# Patient Record
Sex: Female | Born: 1950
Health system: Southern US, Community
[De-identification: ages and names within clinical notes are randomized; demographics above are authoritative.]

## PROBLEM LIST (undated history)

## (undated) DIAGNOSIS — Z923 Personal history of irradiation: Secondary | ICD-10-CM

## (undated) DIAGNOSIS — IMO0002 Reserved for concepts with insufficient information to code with codable children: Secondary | ICD-10-CM

## (undated) DIAGNOSIS — Z803 Family history of malignant neoplasm of breast: Secondary | ICD-10-CM

## (undated) DIAGNOSIS — R112 Nausea with vomiting, unspecified: Secondary | ICD-10-CM

## (undated) DIAGNOSIS — T8859XA Other complications of anesthesia, initial encounter: Secondary | ICD-10-CM

## (undated) DIAGNOSIS — Z9889 Other specified postprocedural states: Secondary | ICD-10-CM

## (undated) DIAGNOSIS — M810 Age-related osteoporosis without current pathological fracture: Secondary | ICD-10-CM

## (undated) DIAGNOSIS — E785 Hyperlipidemia, unspecified: Secondary | ICD-10-CM

## (undated) DIAGNOSIS — Z9221 Personal history of antineoplastic chemotherapy: Secondary | ICD-10-CM

## (undated) DIAGNOSIS — M8580 Other specified disorders of bone density and structure, unspecified site: Secondary | ICD-10-CM

## (undated) DIAGNOSIS — J309 Allergic rhinitis, unspecified: Secondary | ICD-10-CM

## (undated) DIAGNOSIS — Z853 Personal history of malignant neoplasm of breast: Secondary | ICD-10-CM

## (undated) DIAGNOSIS — Z801 Family history of malignant neoplasm of trachea, bronchus and lung: Secondary | ICD-10-CM

## (undated) HISTORY — DX: Other specified disorders of bone density and structure, unspecified site: M85.80

## (undated) HISTORY — DX: Family history of malignant neoplasm of breast: Z80.3

## (undated) HISTORY — DX: Personal history of malignant neoplasm of breast: Z85.3

## (undated) HISTORY — DX: Reserved for concepts with insufficient information to code with codable children: IMO0002

## (undated) HISTORY — PX: LATISSIMUS FLAP TO BREAST: SHX5357

## (undated) HISTORY — DX: Age-related osteoporosis without current pathological fracture: M81.0

## (undated) HISTORY — PX: OTHER SURGICAL HISTORY: SHX169

## (undated) HISTORY — DX: Allergic rhinitis, unspecified: J30.9

## (undated) HISTORY — DX: Hyperlipidemia, unspecified: E78.5

## (undated) HISTORY — DX: Family history of malignant neoplasm of trachea, bronchus and lung: Z80.1

## (undated) HISTORY — PX: BREAST LUMPECTOMY: SHX2

---

## 1990-07-27 DIAGNOSIS — C50919 Malignant neoplasm of unspecified site of unspecified female breast: Secondary | ICD-10-CM

## 1990-07-27 HISTORY — DX: Malignant neoplasm of unspecified site of unspecified female breast: C50.919

## 1999-07-15 ENCOUNTER — Encounter: Payer: Self-pay | Admitting: Family Medicine

## 1999-07-15 ENCOUNTER — Encounter: Admission: RE | Admit: 1999-07-15 | Discharge: 1999-07-15 | Payer: Self-pay | Admitting: Family Medicine

## 2000-02-18 ENCOUNTER — Other Ambulatory Visit: Admission: RE | Admit: 2000-02-18 | Discharge: 2000-02-18 | Payer: Self-pay | Admitting: Family Medicine

## 2000-07-26 ENCOUNTER — Encounter: Payer: Self-pay | Admitting: Gynecology

## 2000-07-26 ENCOUNTER — Encounter: Admission: RE | Admit: 2000-07-26 | Discharge: 2000-07-26 | Payer: Self-pay | Admitting: Gynecology

## 2001-08-01 ENCOUNTER — Encounter: Admission: RE | Admit: 2001-08-01 | Discharge: 2001-08-01 | Payer: Self-pay | Admitting: Family Medicine

## 2001-08-01 ENCOUNTER — Encounter: Payer: Self-pay | Admitting: Family Medicine

## 2001-11-03 ENCOUNTER — Other Ambulatory Visit: Admission: RE | Admit: 2001-11-03 | Discharge: 2001-11-03 | Payer: Self-pay | Admitting: Family Medicine

## 2002-07-14 ENCOUNTER — Emergency Department (HOSPITAL_COMMUNITY): Admission: EM | Admit: 2002-07-14 | Discharge: 2002-07-14 | Payer: Self-pay | Admitting: Emergency Medicine

## 2002-08-07 ENCOUNTER — Encounter: Admission: RE | Admit: 2002-08-07 | Discharge: 2002-08-07 | Payer: Self-pay | Admitting: Family Medicine

## 2002-08-07 ENCOUNTER — Encounter: Payer: Self-pay | Admitting: Family Medicine

## 2003-08-21 ENCOUNTER — Encounter: Admission: RE | Admit: 2003-08-21 | Discharge: 2003-08-21 | Payer: Self-pay | Admitting: Family Medicine

## 2004-07-11 ENCOUNTER — Emergency Department: Payer: Self-pay | Admitting: Emergency Medicine

## 2004-09-16 ENCOUNTER — Encounter: Admission: RE | Admit: 2004-09-16 | Discharge: 2004-09-16 | Payer: Self-pay | Admitting: Family Medicine

## 2005-05-31 ENCOUNTER — Emergency Department: Payer: Self-pay | Admitting: General Practice

## 2005-06-16 ENCOUNTER — Ambulatory Visit: Payer: Self-pay | Admitting: Family Medicine

## 2005-09-21 ENCOUNTER — Encounter: Admission: RE | Admit: 2005-09-21 | Discharge: 2005-09-21 | Payer: Self-pay | Admitting: Family Medicine

## 2005-09-24 ENCOUNTER — Encounter: Admission: RE | Admit: 2005-09-24 | Discharge: 2005-09-24 | Payer: Self-pay | Admitting: Family Medicine

## 2006-03-25 ENCOUNTER — Other Ambulatory Visit: Admission: RE | Admit: 2006-03-25 | Discharge: 2006-03-25 | Payer: Self-pay | Admitting: Family Medicine

## 2006-03-25 ENCOUNTER — Encounter: Payer: Self-pay | Admitting: Family Medicine

## 2006-03-25 ENCOUNTER — Ambulatory Visit: Payer: Self-pay | Admitting: Family Medicine

## 2006-09-29 ENCOUNTER — Encounter: Admission: RE | Admit: 2006-09-29 | Discharge: 2006-09-29 | Payer: Self-pay | Admitting: Family Medicine

## 2007-04-15 ENCOUNTER — Ambulatory Visit: Payer: Self-pay | Admitting: Family Medicine

## 2007-04-15 DIAGNOSIS — H1045 Other chronic allergic conjunctivitis: Secondary | ICD-10-CM

## 2007-04-15 DIAGNOSIS — Z853 Personal history of malignant neoplasm of breast: Secondary | ICD-10-CM

## 2007-04-15 DIAGNOSIS — J309 Allergic rhinitis, unspecified: Secondary | ICD-10-CM | POA: Insufficient documentation

## 2007-08-18 ENCOUNTER — Ambulatory Visit: Payer: Self-pay | Admitting: Family Medicine

## 2007-10-27 ENCOUNTER — Encounter: Admission: RE | Admit: 2007-10-27 | Discharge: 2007-10-27 | Payer: Self-pay | Admitting: Family Medicine

## 2007-11-01 ENCOUNTER — Encounter (INDEPENDENT_AMBULATORY_CARE_PROVIDER_SITE_OTHER): Payer: Self-pay | Admitting: *Deleted

## 2007-12-26 ENCOUNTER — Telehealth: Payer: Self-pay | Admitting: Family Medicine

## 2008-02-23 ENCOUNTER — Encounter: Admission: RE | Admit: 2008-02-23 | Discharge: 2008-02-23 | Payer: Self-pay | Admitting: Family Medicine

## 2008-04-06 ENCOUNTER — Ambulatory Visit: Payer: Self-pay | Admitting: Family Medicine

## 2008-04-06 ENCOUNTER — Other Ambulatory Visit: Admission: RE | Admit: 2008-04-06 | Discharge: 2008-04-06 | Payer: Self-pay | Admitting: Family Medicine

## 2008-04-06 ENCOUNTER — Encounter: Payer: Self-pay | Admitting: Family Medicine

## 2008-04-06 DIAGNOSIS — M810 Age-related osteoporosis without current pathological fracture: Secondary | ICD-10-CM | POA: Insufficient documentation

## 2008-04-10 LAB — CONVERTED CEMR LAB
ALT: 16 units/L (ref 0–35)
Albumin: 4.2 g/dL (ref 3.5–5.2)
BUN: 20 mg/dL (ref 6–23)
Calcium: 9.7 mg/dL (ref 8.4–10.5)
Cholesterol: 244 mg/dL — ABNORMAL HIGH (ref 0–200)
HDL: 50 mg/dL (ref >39)
Sodium: 142 meq/L (ref 135–145)
TSH: 1.547 microintl units/mL (ref 0.350–4.50)
Total CHOL/HDL Ratio: 4.9
Total Protein: 6.5 g/dL (ref 6.0–8.3)
Triglycerides: 180 mg/dL — ABNORMAL HIGH (ref <150)

## 2008-04-11 ENCOUNTER — Encounter: Payer: Self-pay | Admitting: Family Medicine

## 2008-04-11 ENCOUNTER — Encounter: Admission: RE | Admit: 2008-04-11 | Discharge: 2008-04-11 | Payer: Self-pay | Admitting: Family Medicine

## 2008-04-13 ENCOUNTER — Ambulatory Visit: Payer: Self-pay | Admitting: Family Medicine

## 2008-04-13 DIAGNOSIS — E785 Hyperlipidemia, unspecified: Secondary | ICD-10-CM | POA: Insufficient documentation

## 2008-04-26 ENCOUNTER — Ambulatory Visit: Payer: Self-pay | Admitting: Gastroenterology

## 2008-11-23 ENCOUNTER — Encounter: Admission: RE | Admit: 2008-11-23 | Discharge: 2008-11-23 | Payer: Self-pay | Admitting: Family Medicine

## 2008-11-29 ENCOUNTER — Encounter (INDEPENDENT_AMBULATORY_CARE_PROVIDER_SITE_OTHER): Payer: Self-pay | Admitting: *Deleted

## 2008-12-04 ENCOUNTER — Ambulatory Visit: Payer: Self-pay | Admitting: Family Medicine

## 2009-04-24 ENCOUNTER — Telehealth: Payer: Self-pay | Admitting: Family Medicine

## 2009-05-30 ENCOUNTER — Ambulatory Visit: Payer: Self-pay | Admitting: Family Medicine

## 2009-09-03 ENCOUNTER — Ambulatory Visit: Payer: Self-pay | Admitting: Family Medicine

## 2009-09-10 ENCOUNTER — Ambulatory Visit: Payer: Self-pay | Admitting: Family Medicine

## 2009-09-26 ENCOUNTER — Emergency Department: Payer: Self-pay | Admitting: Internal Medicine

## 2009-10-07 ENCOUNTER — Ambulatory Visit: Payer: Self-pay | Admitting: Family Medicine

## 2009-11-28 ENCOUNTER — Other Ambulatory Visit: Admission: RE | Admit: 2009-11-28 | Discharge: 2009-11-28 | Payer: Self-pay | Admitting: Family Medicine

## 2009-11-28 ENCOUNTER — Encounter (INDEPENDENT_AMBULATORY_CARE_PROVIDER_SITE_OTHER): Payer: Self-pay | Admitting: *Deleted

## 2009-11-28 ENCOUNTER — Ambulatory Visit: Payer: Self-pay | Admitting: Family Medicine

## 2009-11-28 LAB — CONVERTED CEMR LAB: Pap Smear: NORMAL

## 2009-11-29 ENCOUNTER — Encounter: Admission: RE | Admit: 2009-11-29 | Discharge: 2009-11-29 | Payer: Self-pay | Admitting: Family Medicine

## 2009-11-29 LAB — HM MAMMOGRAPHY

## 2009-12-02 ENCOUNTER — Encounter (INDEPENDENT_AMBULATORY_CARE_PROVIDER_SITE_OTHER): Payer: Self-pay | Admitting: *Deleted

## 2009-12-02 LAB — CONVERTED CEMR LAB
Alkaline Phosphatase: 88 units/L (ref 39–117)
Basophils Absolute: 0 10*3/uL (ref 0.0–0.1)
Basophils Relative: 0.5 % (ref 0.0–3.0)
Bilirubin, Direct: 0.1 mg/dL (ref 0.0–0.3)
Calcium: 9.7 mg/dL (ref 8.4–10.5)
Creatinine, Ser: 0.7 mg/dL (ref 0.4–1.2)
Direct LDL: 165.9 mg/dL
Eosinophils Absolute: 0.1 10*3/uL (ref 0.0–0.7)
Eosinophils Relative: 1.7 % (ref 0.0–5.0)
Glucose, Bld: 92 mg/dL (ref 70–99)
Lymphocytes Relative: 27.3 % (ref 12.0–46.0)
Lymphs Abs: 1.7 10*3/uL (ref 0.7–4.0)
MCV: 88.8 fL (ref 78.0–100.0)
Neutro Abs: 3.9 10*3/uL (ref 1.4–7.7)
Neutrophils Relative %: 62.2 % (ref 43.0–77.0)
Potassium: 3.8 meq/L (ref 3.5–5.1)
RDW: 13.7 % (ref 11.5–14.6)
Sodium: 142 meq/L (ref 135–145)
TSH: 2.3 microintl units/mL (ref 0.35–5.50)
Total CHOL/HDL Ratio: 4

## 2009-12-09 ENCOUNTER — Encounter: Payer: Self-pay | Admitting: Family Medicine

## 2010-01-10 ENCOUNTER — Encounter (INDEPENDENT_AMBULATORY_CARE_PROVIDER_SITE_OTHER): Payer: Self-pay

## 2010-03-06 ENCOUNTER — Ambulatory Visit: Payer: Self-pay | Admitting: Family Medicine

## 2010-03-07 LAB — CONVERTED CEMR LAB
ALT: 13 units/L (ref 0–35)
AST: 15 units/L (ref 0–37)
Direct LDL: 158.5 mg/dL
HDL: 46.7 mg/dL (ref >39.00)
VLDL: 24.2 mg/dL (ref 0.0–40.0)

## 2010-03-14 ENCOUNTER — Ambulatory Visit: Payer: Self-pay | Admitting: Family Medicine

## 2010-04-14 ENCOUNTER — Encounter: Payer: Self-pay | Admitting: Family Medicine

## 2010-07-07 ENCOUNTER — Ambulatory Visit: Payer: Self-pay | Admitting: Family Medicine

## 2010-07-24 ENCOUNTER — Ambulatory Visit: Payer: Self-pay | Admitting: Family Medicine

## 2010-08-07 ENCOUNTER — Telehealth: Payer: Self-pay | Admitting: Family Medicine

## 2010-08-15 ENCOUNTER — Ambulatory Visit
Admission: RE | Admit: 2010-08-15 | Discharge: 2010-08-15 | Payer: Self-pay | Source: Home / Self Care | Attending: Family Medicine | Admitting: Family Medicine

## 2010-08-15 DIAGNOSIS — IMO0002 Reserved for concepts with insufficient information to code with codable children: Secondary | ICD-10-CM | POA: Insufficient documentation

## 2010-08-17 ENCOUNTER — Encounter: Payer: Self-pay | Admitting: Family Medicine

## 2010-08-26 NOTE — Assessment & Plan Note (Signed)
Summary: ROA  CYD   Vital Signs:  Patient profile:   60 year old female Height:      66 inches Weight:      164.25 pounds BMI:     26.61 Temp:     98.1 degrees F oral Pulse rate:   72 / minute Pulse rhythm:   regular BP sitting:   132 / 80  (left arm) Cuff size:   regular  Vitals Entered By: Lewanda Rife LPN (March 14, 2010 9:57 AM) CC: follow-up visit   History of Present Illness: here for f/u of lipids wt is down 1 lb  has 2 new grandchildren- excited about it   bp good  132/80  lipids are down just a bit with  trig 121 and HDL 46 and LDL 158 (from 165) she did work on her diet  in general eating more salads  smaller portions  cutting out the red meat  eats chicken  in general nothing fried -- but the chicken on her salad is fried   uses cream in her coffee   is apprehensive to take cholesterol med - but will try it   is back to work - MTW is sore in back where lattisimus was removed for flap  tried a hot pad apart from that is doing very well     Allergies: 1)  ! Fosamax 2)  Evista (Raloxifene Hcl)  Past History:  Past Medical History: Last updated: 11/28/2009 Allergic rhinitis Breast cancer, hx of  (needs yearly mam and mri every 2  osteopenia  hyperlipidemia  OP  Past Surgical History: Last updated: 02/23/2008 Breast cancer excision with latissumus flap secondary to radiation burn 7/09 neg screening breast MRI  Family History: Last updated: 04/13/2008  mother -- b12 deficiency, breast ca, melanoma , myelodysplasic syndrome , OP sister b 12 deficiency, breast cancer , OP  multiple cousins with breast cancer   Social History: Last updated: 04/06/2008 Marital Status: Married Children: 4 Occupation:  quit smoking years ago  never drinks alcohol   Risk Factors: Smoking Status: quit (04/15/2007)  Review of Systems General:  Denies fatigue, loss of appetite, and malaise. CV:  Denies chest pain or discomfort, palpitations, shortness of  breath with exertion, and swelling of feet. Resp:  Denies cough and wheezing. GI:  Denies change in bowel habits. MS:  Denies muscle aches and cramps. Derm:  Denies lesion(s) and rash. Neuro:  Denies numbness and tingling. Psych:  Denies anxiety and depression. Endo:  Denies cold intolerance, excessive thirst, excessive urination, and heat intolerance. Heme:  Denies abnormal bruising and bleeding.  Physical Exam  General:  Well-developed,well-nourished,in no acute distress; alert,appropriate and cooperative throughout examination Head:  normocephalic, atraumatic, and no abnormalities observed.   Eyes:  vision grossly intact, pupils equal, pupils round, and pupils reactive to light.  no conjunctival pallor, injection or icterus  Neck:  supple with full rom and no masses or thyromegally, no JVD or carotid bruit  Lungs:  Normal respiratory effort, chest expands symmetrically. Lungs are clear to auscultation, no crackles or wheezes. Heart:  Normal rate and regular rhythm. S1 and S2 normal without gallop, murmur, click, rub or other extra sounds. Msk:  No deformity or scoliosis noted of thoracic or lumbar spine.   Pulses:  R and L carotid,radial,femoral,dorsalis pedis and posterior tibial pulses are full and equal bilaterally Extremities:  No clubbing, cyanosis, edema, or deformity noted with normal full range of motion of all joints.   Skin:  Intact without suspicious  lesions or rashes   Impression & Recommendations:  Problem # 1:  HYPERLIPIDEMIA (ICD-272.4) Assessment Unchanged  disc low sat fat diet in detail recommended she stop fried chicken/ cream/ high fat dressings start zocor 20  adv if side eff stop and call lab in 6 weeks  disc goals for HDL and LDL chol and risks of high chol today in detail Her updated medication list for this problem includes:    Zocor 20 Mg Tabs (Simvastatin) .Marland Kitchen... 1 by mouth once daily in evening  Labs Reviewed: SGOT: 15 (03/06/2010)   SGPT: 13  (03/06/2010)   HDL:46.70 (03/06/2010), 57.20 (11/28/2009)  LDL:158 (04/06/2008)  Chol:218 (03/06/2010), 232 (11/28/2009)  Trig:121.0 (03/06/2010), 134.0 (11/28/2009)  Orders: Prescription Created Electronically [phone])  Complete Medication List: 1)  Multivitamins Tabs (Multiple vitamin) .... Takes 3 tablets by mouth daily 2)  Vitamin C 500 Mg Tabs (Ascorbic acid) .... Take 1 tablet by mouth three times a day 3)  Aleve 220 Mg Tabs (Naproxen sodium) .... Otc as directed. 4)  Miacalcin 200 Unit/ml Soln (Calcitonin (salmon)) .Marland Kitchen.. 1 spray in one nostril daily- alternate nostrils 5)  Vitamin D 2000 Unit Tabs (Cholecalciferol) .... Take 1 tablet by mouth once a day 6)  Zocor 20 Mg Tabs (Simvastatin) .Marland Kitchen.. 1 by mouth once daily in evening  Patient Instructions: 1)  switch to non fat creamer 2)  stop the crispy / fried chicken  3)  you can raise your HDL (good cholesterol) by increasing exercise and eating omega 3 fatty acid supplement like fish oil or flax seed oil over the counter 4)  you can lower LDL (bad cholesterol) by limiting saturated fats in diet like red meat, fried foods, egg yolks, fatty breakfast meats, high fat dairy products and shellfish 5)  start the zocor 6)  if any side effect  like muscle pain- let me know and stop it  7)  schedule fasting labs in 6 weeks lipid/ast/alt 272 Prescriptions: ZOCOR 20 MG TABS (SIMVASTATIN) 1 by mouth once daily in evening  #30 x 11   Entered and Authorized by:   Judith Part MD   Signed by:   Judith Part MD on 03/14/2010   Method used:   Electronically to        [facility]* (retail)       [address]       [place], [place]  [address]       Ph: [phone]       Fax: [phone]   RxID:   [id number]   Current Allergies (reviewed today): ! FOSAMAX EVISTA (RALOXIFENE HCL)

## 2010-08-26 NOTE — Assessment & Plan Note (Signed)
Summary: 9:15 SINUS INF/DLO   Vital Signs:  Patient profile:   60 year old female Height:      66 inches Weight:      164.38 pounds BMI:     26.63 Temp:     98 degrees F oral Pulse rate:   96 / minute Pulse rhythm:   regular BP sitting:   130 / 84  (left arm) Cuff size:   regular  Vitals Entered By: Delilah Shan CMA Duncan Dull) (September 03, 2009 9:11 AM) CC: ? sinus infection   History of Present Illness: 60 yo with 2 week so nasal congestion, sinus pressure. Body ahces and chills, subjective fever. Started having productive cough yesterday. No SOB or wheezing. Started feeling better but over last couple of days, feels symptoms are getting worse. No n/v/d. No rashes. Ears popping, no sore throat. Taking Dayquil with only mild relief of symptoms.  Current Medications (verified): 1)  Multivitamins   Tabs (Multiple Vitamin) .... Takes 2 Tablets By Mouth Daily 2)  Vitamin C 500 Mg  Tabs (Ascorbic Acid) .... Take 1 Tablet By Mouth Three Times A Day 3)  Augmentin 875-125 Mg Tabs (Amoxicillin-Pot Clavulanate) .Marland Kitchen.. 1 By Mouth 2 Times Daily X 10 Days 4)  Cheratussin Ac 100-10 Mg/54ml Syrp (Guaifenesin-Codeine) .... 5ml At Bedtime As Needed Cough  Allergies: 1)  ! Fosamax 2)  Evista (Raloxifene Hcl)  Review of Systems      See HPI General:  Complains of chills and fever. ENT:  Complains of earache, nasal congestion, postnasal drainage, sinus pressure, and sore throat; denies ear discharge and hoarseness. CV:  Denies chest pain or discomfort. Resp:  Complains of cough and sputum productive; denies shortness of breath and wheezing. GI:  Denies diarrhea, nausea, and vomiting.  Physical Exam  General:  Well-developed,well-nourished,in no acute distress; alert,appropriate and cooperative throughout examination VS reviewed- afebrile and normotensive. Ears:  TMs retracted bilaterally Nose:  nasal dischargemucosal pallor.   maxillary sinuses tender. Mouth:  pharyngeal erythema, no  exudates. Lungs:  Normal respiratory effort, chest expands symmetrically. Lungs are clear to auscultation, no crackles or wheezes. Heart:  Normal rate and regular rhythm. S1 and S2 normal without gallop, murmur, click, rub or other extra sounds. Extremities:  no edema Psych:  normal affect, talkative and pleasant    Impression & Recommendations:  Problem # 1:  OTHER ACUTE SINUSITIS (ICD-461.8) Assessment New Given duration and progression of symptoms, likely bacterial. Will treat with Augmentin. Continue supportive care.  See patient instrucitons for details. Her updated medication list for this problem includes:    Augmentin 875-125 Mg Tabs (Amoxicillin-pot clavulanate) .Marland Kitchen... 1 by mouth 2 times daily x 10 days    Cheratussin Ac 100-10 Mg/8ml Syrp (Guaifenesin-codeine) .Marland KitchenMarland KitchenMarland KitchenMarland Kitchen 5ml at bedtime as needed cough  Complete Medication List: 1)  Multivitamins Tabs (Multiple vitamin) .... Takes 2 tablets by mouth daily 2)  Vitamin C 500 Mg Tabs (Ascorbic acid) .... Take 1 tablet by mouth three times a day 3)  Augmentin 875-125 Mg Tabs (Amoxicillin-pot clavulanate) .Marland Kitchen.. 1 by mouth 2 times daily x 10 days 4)  Cheratussin Ac 100-10 Mg/4ml Syrp (Guaifenesin-codeine) .... 5ml at bedtime as needed cough  Patient Instructions: 1)  Take antibiotic as directed.  Drink lots of fluids.  Treat sympotmatically with Mucinex, nasal saline irrigation, and Tylenol/Ibuprofen.  You can use warm compresses.  Cough suppressant at night. Call if not improving as expected in 5-7 days.  Prescriptions: CHERATUSSIN AC 100-10 MG/5ML SYRP (GUAIFENESIN-CODEINE) 5ml at bedtime as needed  cough  #4 ounces x 0   Entered and Authorized by:   Ruthe Mannan MD   Signed by:   Ruthe Mannan MD on 09/03/2009   Method used:   Print then Give to Patient   RxID:   [id number] AUGMENTIN 875-125 MG TABS (AMOXICILLIN-POT CLAVULANATE) 1 by mouth 2 times daily x 10 days  #20 x 0   Entered and Authorized by:   Ruthe Mannan MD   Signed by:    Ruthe Mannan MD on 09/03/2009   Method used:   Electronically to        [facility]* (retail)       [address]       [place], [place]  [address]       Ph: [phone]       Fax: [phone]   RxID:   [id number]   Current Allergies (reviewed today): ! FOSAMAX EVISTA (RALOXIFENE HCL)

## 2010-08-26 NOTE — Miscellaneous (Signed)
Summary: pap screening  Clinical Lists Changes  Observations: Added new observation of PAP SMEAR: normal (11/28/2009 10:16)      Preventive Care Screening  Pap Smear:    Date:  11/28/2009    Results:  normal

## 2010-08-26 NOTE — Letter (Signed)
Summary: Pre Visit No Show Letter  [facility]  [address] [place], [place] [address]   Phone: [phone]  Fax: [phone]        January 10, 2010 MRN: [id number]    FAIZA BANSAL [address] [place], [place]  [address]    Dear Ms. DUDAS,   We have been unable to reach you by phone concerning the pre-procedure visit that you missed on 01/10/10. For this reason,your procedure scheduled on 01/24/10  has been cancelled. Our scheduling staff will gladly assist you with rescheduling your appointments at a more convenient time. Please call our office at [phone] between the hours of 8:00am and 5:00pm, press option #2 to reach an appointment scheduler. Please consider updating your contact numbers at this time so that we can reach you by phone in the future with schedule changes or results.    Thank you,    Ulis Rias RN [facility]

## 2010-08-26 NOTE — Assessment & Plan Note (Signed)
Summary: REMOVE STAPLES FROM HEAD/CLE   Vital Signs:  Patient profile:   60 year old female Height:      66 inches Weight:      165.0 pounds BMI:     26.73 Temp:     98.0 degrees F oral Pulse rate:   88 / minute Pulse rhythm:   regular BP sitting:   110 / 80  (left arm) Cuff size:   regular  Vitals Entered By: Benny Lennert CMA Duncan Dull) (October 07, 2009 3:18 PM)  History of Present Illness: Chief complaint Remove staple from head  Allergies: 1)  ! Fosamax 2)  Evista (Raloxifene Hcl)   Impression & Recommendations:  Problem # 1:  ENCOUNTER FOR REMOVAL OF SUTURES (ICD-V58.32)  staples x 3 removed from head  I was not the operating surgeon  c/d/i  Orders: Suture Removal by Non-Operative MD ([id number])  Complete Medication List: 1)  Multivitamins Tabs (Multiple vitamin) .... Takes 2 tablets by mouth daily 2)  Vitamin C 500 Mg Tabs (Ascorbic acid) .... Take 1 tablet by mouth three times a day  Current Allergies (reviewed today): ! FOSAMAX EVISTA (RALOXIFENE HCL)

## 2010-08-26 NOTE — Assessment & Plan Note (Signed)
Summary: CPX/CLE   Vital Signs:  Patient profile:   60 year old female Height:      66 inches Weight:      165.75 pounds BMI:     26.85 Temp:     98.1 degrees F oral Pulse rate:   76 / minute Pulse rhythm:   regular BP sitting:   136 / 84  (left arm) Cuff size:   regular  Vitals Entered By: Lewanda Rife LPN (Nov 28, 452 10:19 AM) CC: CPX LMP 20 years ago   History of Present Illness: here for health mt exam and to rev problems  wt is stable  is feeling good in general   2 of her daughters -- are expecting babies this summer  is excited about this   B  Ca years ago -- no regular follow up at this point  last mam 4/10 -- has it scheduled for tomorrow  self exam - no lumps at all   last pap 09-- need this today no gyn problems   Td 06   has never had shingles  ostopenia - dexa 9/09 non to fosamax, non tol to evista - for leg pain  disc evista ca and D-- is taking over the counter -- 3 per day , last D level 30  is open to miacalcin ? if would tol reclast   expects chol to be up  has eaten worse since stopping working  did get false teeth- is happy with it  still drinks coke  drinks mango juice    ?colon screen-- never had it done- wants to re - schedule  had to re schedule it    Allergies: 1)  ! Fosamax 2)  Evista (Raloxifene Hcl)  Past History:  Past Surgical History: Last updated: 02/23/2008 Breast cancer excision with latissumus flap secondary to radiation burn 7/09 neg screening breast MRI  Family History: Last updated: 04/13/2008  mother -- b12 deficiency, breast ca, melanoma , myelodysplasic syndrome , OP sister b 12 deficiency, breast cancer , OP  multiple cousins with breast cancer   Social History: Last updated: 04/06/2008 Marital Status: Married Children: 4 Occupation:  quit smoking years ago  never drinks alcohol   Risk Factors: Smoking Status: quit (04/15/2007)  Past Medical History: Allergic rhinitis Breast cancer, hx of   (needs yearly mam and mri every 2  osteopenia  hyperlipidemia  OP  Review of Systems General:  Denies fatigue, fever, loss of appetite, and malaise. Eyes:  Denies blurring and eye pain. ENT:  Denies sinus pressure and sore throat. CV:  Denies chest pain or discomfort, lightheadness, palpitations, and swelling of feet. Resp:  Denies cough and wheezing. GI:  Denies abdominal pain, indigestion, and nausea. GU:  Denies abnormal vaginal bleeding, discharge, dysuria, hematuria, and urinary frequency. MS:  Denies joint pain, joint redness, joint swelling, muscle aches, and cramps. Derm:  Denies itching, lesion(s), poor wound healing, and rash. Neuro:  Denies headaches, numbness, and tingling. Psych:  Denies anxiety and depression. Endo:  Denies cold intolerance, excessive thirst, excessive urination, and heat intolerance. Heme:  Denies abnormal bruising and bleeding.  Physical Exam  General:  Well-developed,well-nourished,in no acute distress; alert,appropriate and cooperative throughout examination Head:  normocephalic, atraumatic, and no abnormalities observed.   Eyes:  vision grossly intact, pupils equal, pupils round, and pupils reactive to light.  no conjunctival pallor, injection or icterus  Ears:  R ear normal and L ear normal.  - scant cerumen  Nose:  no nasal discharge.  Mouth:  pharynx pink and moist.   Neck:  supple with full rom and no masses or thyromegally, no JVD or carotid bruit  Breasts:  surgical changes r breast  no skin change/nipple dc or masses noted bilat  Lungs:  Normal respiratory effort, chest expands symmetrically. Lungs are clear to auscultation, no crackles or wheezes. Heart:  Normal rate and regular rhythm. S1 and S2 normal without gallop, murmur, click, rub or other extra sounds. Abdomen:  Bowel sounds positive,abdomen soft and non-tender without masses, organomegaly or hernias noted. no renal bruits  Genitalia:  Normal introitus for age, no external  lesions, no vaginal discharge, mucosa pink and moist, no vaginal or cervical lesions, no vaginal atrophy, no friaility or hemorrhage, normal uterus size and position, no adnexal masses or tenderness Msk:  No deformity or scoliosis noted of thoracic or lumbar spine.  mild kyphosis  no acute joint changes  Pulses:  R and L carotid,radial,femoral,dorsalis pedis and posterior tibial pulses are full and equal bilaterally Extremities:  No clubbing, cyanosis, edema, or deformity noted with normal full range of motion of all joints.   Neurologic:  sensation intact to light touch, gait normal, and DTRs symmetrical and normal.   Skin:  Intact without suspicious lesions or rashes lentigos diffusely  Cervical Nodes:  No lymphadenopathy noted Axillary Nodes:  No palpable lymphadenopathy Inguinal Nodes:  No significant adenopathy Psych:  normal affect, talkative and pleasant    Impression & Recommendations:  Problem # 1:  HEALTH MAINTENANCE EXAM (ICD-V70.0) Assessment Comment Only reviewed health habits including diet, exercise and skin cancer prevention reviewed health maintenance list and family history lab today  Orders: Venipuncture (91478) TLB-Lipid Panel (80061-LIPID) TLB-BMP (Basic Metabolic Panel-BMET) (80048-METABOL) TLB-Hepatic/Liver Function Pnl (80076-HEPATIC) TLB-TSH (Thyroid Stimulating Hormone) (84443-TSH) TLB-CBC Platelet - w/Differential (85025-CBCD) T-Vitamin D (25-Hydroxy) (29562-13086) Prescription Created Electronically [phone])  Problem # 2:  ROUTINE GYNECOLOGICAL EXAMINATION (ICD-V72.31) Assessment: Comment Only annual exam with pap done  Problem # 3:  HYPERLIPIDEMIA (ICD-272.4) Assessment: Unchanged  disc low sat fat diet in detail lab today and update Orders: Venipuncture ([address]295) TLB-Lipid Panel (80061-LIPID) TLB-BMP (Basic Metabolic Panel-BMET) (80048-METABOL) TLB-Hepatic/Liver Function Pnl (80076-HEPATIC) TLB-TSH (Thyroid Stimulating Hormone)  (84443-TSH) TLB-CBC Platelet - w/Differential (85025-CBCD) T-Vitamin D (25-Hydroxy) (28413-24401) Prescription Created Electronically [phone])  Labs Reviewed: SGOT: 12 (04/06/2008)   SGPT: 16 (04/06/2008)   HDL:50 (04/06/2008)  LDL:158 (04/06/2008)  Chol:244 (04/06/2008)  Trig:180 (04/06/2008)  Problem # 4:  OSTEOPOROSIS (ICD-733.00) Assessment: Unchanged  due for dexa in sept - will order trial of miacalcin since not to to fosamax or evista ? candidate for reclast check dexa and make plan in fall Her updated medication list for this problem includes:    Miacalcin 200 Unit/ml Soln (Calcitonin (salmon)) .Marland Kitchen... 1 spray in one nostril daily- alternate nostrils  Orders: Venipuncture (36644) TLB-Lipid Panel (80061-LIPID) TLB-BMP (Basic Metabolic Panel-BMET) (80048-METABOL) TLB-Hepatic/Liver Function Pnl (80076-HEPATIC) TLB-TSH (Thyroid Stimulating Hormone) (84443-TSH) TLB-CBC Platelet - w/Differential (85025-CBCD) T-Vitamin D (25-Hydroxy) (03474-25956) Specimen Handling (38756) Radiology Referral (Radiology) Prescription Created Electronically [id number])  Vit D:30 (04/06/2008)  Problem # 5:  BREAST CANCER, HX OF (ICD-V10.3) Assessment: Unchanged mam planned tomorrow may need suppl mri- will inform  nl exam today  Problem # 6:  SCREENING, COLON CANCER (ICD-V76.51) Assessment: Comment Only re sched colonosc  personal hx of breast ca  Orders: Gastroenterology Referral (GI)  Complete Medication List: 1)  Multivitamins Tabs (Multiple vitamin) .... Takes 3 tablets by mouth daily 2)  Vitamin C 500 Mg Tabs (Ascorbic acid) .... Take 1  tablet by mouth three times a day 3)  Aleve 220 Mg Tabs (Naproxen sodium) .... Otc as directed. 4)  Miacalcin 200 Unit/ml Soln (Calcitonin (salmon)) .Marland Kitchen.. 1 spray in one nostril daily- alternate nostrils  Patient Instructions: 1)  we will do bone density referral at check out  2)  we will do colonoscopy referral at check out  3)  lab today  4)  work  on diet and exercise 5)  start miacalcin nasal spray for osteoporosis  Prescriptions: MIACALCIN 200 UNIT/ML SOLN (CALCITONIN (SALMON)) 1 spray in one nostril daily- alternate nostrils  #1 month x 11   Entered and Authorized by:   Judith Part MD   Signed by:   Judith Part MD on 11/28/2009   Method used:   Electronically to        [facility]* (retail)       [address]       [place], [place]  [address]       Ph: [phone]       Fax: [phone]   RxID:   [id number]   Current Allergies (reviewed today): ! FOSAMAX EVISTA (RALOXIFENE HCL)

## 2010-08-26 NOTE — Letter (Signed)
Summary: Results Follow up Letter  [place] [facility]  [address] [place], [place] [address]   Phone: [phone]  Fax: [phone]    12/09/2009 MRN: [id number]    LATOSHIA MONRROY [address] [place], [place]  [address]    Dear [name],  The following are the results of your recent test(s):  Test         Result    Pap Smear:        Normal __X___  Not Normal _____ Comments: ______________________________________________________ Cholesterol: LDL(Bad cholesterol):         Your goal is less than:         HDL (Good cholesterol):       Your goal is more than: Comments:  ______________________________________________________ Mammogram:        Normal _____  Not Normal _____ Comments:  ___________________________________________________________________ Hemoccult:        Normal _____  Not normal _______ Comments:    _____________________________________________________________________ Other Tests:    We routinely do not discuss normal results over the telephone.  If you desire a copy of the results, or you have any questions about this information we can discuss them at your next office visit.   Sincerely,   Idamae Schuller Anessa Charley,MD  MT/ri

## 2010-08-26 NOTE — Letter (Signed)
Summary: Previsit letter  [facility]  [address] [place], [place] [address]   Phone: [phone]  Fax: [phone]       11/28/2009 MRN: [id number]  Ashley Rasmussen [address] [place], [place]  [address]  Dear Ashley Rasmussen,  Welcome to the Gastroenterology Division at [facility].    You are scheduled to see a nurse for your pre-procedure visit on 01-10-10 at 10:00a.m. on the 3rd floor at [facility], [address]  We ask that you try to arrive at our office 15 minutes prior to your appointment time to allow for check-in.  Your nurse visit will consist of discussing your medical and surgical history, your immediate family medical history, and your medications.    Please bring a complete list of all your medications or, if you prefer, bring the medication bottles and we will list them.  We will need to be aware of both prescribed and over the counter drugs.  We will need to know exact dosage information as well.  If you are on blood thinners (Coumadin, Plavix, Aggrenox, Ticlid, etc.) please call our office today/prior to your appointment, as we need to consult with your physician about holding your medication.   Please be prepared to read and sign documents such as consent forms, a financial agreement, and acknowledgement forms.  If necessary, and with your consent, a friend or relative is welcome to sit-in on the nurse visit with you.  Please bring your insurance card so that we may make a copy of it.  If your insurance requires a referral to see a specialist, please bring your referral form from your primary care physician.  No co-pay is required for this nurse visit.     If you cannot keep your appointment, please call [phone] to cancel or reschedule prior to your appointment date.  This allows [place] the opportunity to schedule an appointment for another patient in need of care.    Thank you for choosing [facility] for your medical  needs.  We appreciate the opportunity to care for you.  Please visit [place] at our website  to learn more about our practice.                     Sincerely.                                                                                                                   The Gastroenterology Division

## 2010-08-26 NOTE — Letter (Signed)
Summary: Results Follow up Letter  [address] [facility]  [address] [place], [place] [address]   Phone: [phone]  Fax: [phone]    12/02/2009 MRN: [id number]    Ashley Rasmussen [address] [place], [place]  [address]    Dear [name],  The following are the results of your recent test(s):  Test         Result    Pap Smear:        Normal _____  Not Normal _____ Comments: ______________________________________________________ Cholesterol: LDL(Bad cholesterol):         Your goal is less than:         HDL (Good cholesterol):       Your goal is more than: Comments:  ______________________________________________________ Mammogram:        Normal ___X__  Not Normal _____ Comments:  Yearly follow up is recommended.   ___________________________________________________________________ Hemoccult:        Normal _____  Not normal _______ Comments:    _____________________________________________________________________ Other Tests:    We routinely do not discuss normal results over the telephone.  If you desire a copy of the results, or you have any questions about this information we can discuss them at your next office visit.   Sincerely,    Marne A. Milinda Antis, M.D.  MAT:lsf

## 2010-08-28 NOTE — Assessment & Plan Note (Signed)
Summary: LEFT LEG PAIN/CLE   Vital Signs:  Patient profile:   60 year old female Height:      66 inches Weight:      166.75 pounds BMI:     27.01 Temp:     98.3 degrees F oral Pulse rate:   84 / minute Pulse rhythm:   regular BP sitting:   130 / 78  (left arm) Cuff size:   regular  Vitals Entered By: Delilah Shan CMA Duncan Dull) (July 07, 2010 1:56 PM) CC: Left leg pain   History of Present Illness: L calf and shin pain. Some better with local heat.  H/o L sciatica.  She had gotten out of car  ~1week ago and twisted and that triggered some her symptoms.  Off zocor- some cramps got better.  Calf pain is better with walking. Pain is worse after resting and then getting up.  Minimal help with aleve.    Current Medications (verified): 1)  Multivitamins   Tabs (Multiple Vitamin) .... Takes 3 Tablets By Mouth Daily 2)  Vitamin C 500 Mg  Tabs (Ascorbic Acid) .... Take 1 Tablet By Mouth Three Times A Day 3)  Aleve 220 Mg Tabs (Naproxen Sodium) .... Otc As Directed. 4)  Miacalcin 200 Unit/ml Soln (Calcitonin (Salmon)) .Marland Kitchen.. 1 Spray in One Nostril Daily- Alternate Nostrils 5)  Vitamin D 2000 Unit Tabs (Cholecalciferol) .... Take 1 Tablet By Mouth Once A Day  Allergies: 1)  ! Fosamax 2)  ! Zocor 3)  Evista (Raloxifene Hcl)  Review of Systems       See HPI.  Otherwise negative.    Physical Exam  General:  A&O in NAD RRR clear to auscultation bilaterally no bilateral lower extremity edema. No gastroc weakness.  gastroc isn't tender to palpation but there is some mild tenderness on the lateral compartment.  no erthema.  Achilles isn't tender to palpation.  distally nv intact.  Not tender to palpation at L knee.  SLR minimally positive on L and this is at almost 90d off the table.    Impression & Recommendations:  Problem # 1:  MUSCLE STRAIN, LEFT CALF (ICD-844.8) Likely benign muscle strain in posterior and lateral compartment.  She feels better with heat, so I'd continue this  along with gentle stretching.   Flexeril as needed, sedation caution, and follow up as needed.  Continue aleve as needed.  she agrees.  Nontoxic.  No indication for imaging.  No bands/cords on the leg.  follow up as needed.  Orders: Prescription Created Electronically [phone])  Complete Medication List: 1)  Multivitamins Tabs (Multiple vitamin) .... Takes 3 tablets by mouth daily 2)  Vitamin C 500 Mg Tabs (Ascorbic acid) .... Take 1 tablet by mouth three times a day 3)  Aleve 220 Mg Tabs (Naproxen sodium) .... Otc as directed. 4)  Miacalcin 200 Unit/ml Soln (Calcitonin (salmon)) .Marland Kitchen.. 1 spray in one nostril daily- alternate nostrils 5)  Vitamin D 2000 Unit Tabs (Cholecalciferol) .... Take 1 tablet by mouth once a day 6)  Flexeril 10 Mg Tabs (Cyclobenzaprine hcl) .... 0.5-1 tab by mouth three times a day as needed for muscle pain, sedation caution  Patient Instructions: 1)  Use the flexeril as needed for pain.  Keep using the heat and the flexeril (it may make you drowsy).  Stretch your calf as we discussed and let us know if you aren't improving.  Take care.  Prescriptions: FLEXERIL 10 MG TABS (CYCLOBENZAPRINE HCL) 0.5-1 tab by mouth three times a day  as needed for muscle pain, sedation caution  #30 x 1   Entered and Authorized by:   Crawford Givens MD   Signed by:   Crawford Givens MD on 07/07/2010   Method used:   Electronically to        [facility]* (retail)       [address]       [place], [place]  [address]       Ph: [phone]       Fax: [phone]   RxID:   [id number]    Orders Added: 1)  Prescription Created Electronically [G8553] 2)  Est. Patient Level III [53664]    Current Allergies (reviewed today): ! FOSAMAX ! ZOCOR EVISTA (RALOXIFENE HCL)

## 2010-08-28 NOTE — Progress Notes (Signed)
Summary: Flexeril   Phone Note Refill Request Message from:  Fax from Pharmacy on August 07, 2010 10:40 AM  Refills Requested: Medication #1:  FLEXERIL 10 MG TABS 0.5-1 tab by mouth three times a day as needed for muscle pain   Last Refilled: 07/23/2010 Refill request from [facility]. [phone]  Initial call taken by: Melody Comas,  August 07, 2010 10:41 AM  Follow-up for Phone Call        px written on EMR for call in  Follow-up by: Judith Part MD,  August 07, 2010 8:56 PM  Additional Follow-up for Phone Call Additional follow up Details #1::        Medication sent electroniocally to [facility] pharmacy as instructed.Lewanda Rife LPN  August 08, 2010 8:13 AM     Prescriptions: FLEXERIL 10 MG TABS (CYCLOBENZAPRINE HCL) 0.5-1 tab by mouth three times a day as needed for muscle pain, sedation caution  #30 x 1   Entered by:   Lewanda Rife LPN   Authorized by:   Judith Part MD   Signed by:   Lewanda Rife LPN on 45/40/9811   Method used:   Electronically to        [facility]* (retail)       [address]       [place], [place]  [address]       Ph: [phone]       Fax: [phone]   RxID:   [id number]

## 2010-08-28 NOTE — Assessment & Plan Note (Signed)
Summary: LEFT LEG/CLE   Vital Signs:  Patient profile:   60 year old female Weight:      168.50 pounds BMI:     27.29 Temp:     97.6 degrees F oral Pulse rate:   96 / minute Pulse rhythm:   regular BP sitting:   138 / 90  (left arm) Cuff size:   regular  Vitals Entered By: Selena Batten Dance CMA (AAMA) (August 15, 2010 8:21 AM) CC: Left leg pain/ ?sciatica   History of Present Illness: thinks she is having problems with sciatica - has had before worse in am  goes from low back down her L leg -- and the back of it (and in buttock) muscle in back of leg feels sore  good days and bad days with it  feels far better when she is working and active   getting out of the car may have worsened it (since then has changed to a [place] )  also lies on L side   a little tingling in that leg   Dr Para March though she had a pulled muscle but now symptoms are changed  muscle relaxer flexeril -does help / needs to pick up new px occ aleve   wt is up 2 lb   bp up today--she thinks due to pain and stress   Allergies: 1)  ! Fosamax 2)  ! Zocor 3)  Evista (Raloxifene Hcl)  Past History:  Past Medical History: Last updated: 11/28/2009 Allergic rhinitis Breast cancer, hx of  (needs yearly mam and mri every 2  osteopenia  hyperlipidemia  OP  Past Surgical History: Last updated: 02/23/2008 Breast cancer excision with latissumus flap secondary to radiation burn 7/09 neg screening breast MRI  Family History: Last updated: 04/13/2008  mother -- b12 deficiency, breast ca, melanoma , myelodysplasic syndrome , OP sister b 12 deficiency, breast cancer , OP  multiple cousins with breast cancer   Social History: Last updated: 04/06/2008 Marital Status: Married Children: 4 Occupation:  quit smoking years ago  never drinks alcohol   Risk Factors: Smoking Status: quit (04/15/2007)  Review of Systems General:  Denies chills, fatigue, fever, loss of appetite, and malaise. Eyes:  Denies  blurring and eye irritation. CV:  Denies chest pain or discomfort, lightheadness, palpitations, and shortness of breath with exertion. Resp:  Denies cough. GI:  Denies nausea and vomiting. GU:  Denies dysuria, hematuria, and urinary frequency. MS:  Complains of low back pain, muscle aches, and stiffness; denies muscle weakness. Derm:  Denies lesion(s), poor wound healing, and rash. Neuro:  Denies numbness and tingling. Endo:  Denies cold intolerance, excessive thirst, excessive urination, and heat intolerance. Heme:  Denies abnormal bruising and bleeding.  Physical Exam  General:  Well-developed,well-nourished,in no acute distress; alert,appropriate and cooperative throughout examination Head:  normocephalic, atraumatic, and no abnormalities observed.   Mouth:  pharynx pink and moist.   Neck:  supple with full rom and no masses or thyromegally, no JVD or carotid bruit  Lungs:  Normal respiratory effort, chest expands symmetrically. Lungs are clear to auscultation, no crackles or wheezes. Heart:  Normal rate and regular rhythm. S1 and S2 normal without gallop, murmur, click, rub or other extra sounds. Abdomen:  no suprapubic tenderness or fullness felt  Msk:  no LS tenderness  some mild buttock muscle tenderness no troch tenderness SLR causes mild L leg pain  nl rom hip  no neuro deficits flex full/ ext 10-20 deg with some discomfort   Pulses:  R  and L carotid,radial,femoral,dorsalis pedis and posterior tibial pulses are full and equal bilaterally Extremities:  No clubbing, cyanosis, edema, or deformity noted with normal full range of motion of all joints.   no tenderness of calfs or hamstrings  Neurologic:  strength normal in all extremities, sensation intact to light touch, gait normal, and DTRs symmetrical and normal.   Skin:  Intact without suspicious lesions or rashes Cervical Nodes:  No lymphadenopathy noted Psych:  normal affect, talkative and pleasant    Impression &  Recommendations:  Problem # 1:  BACK PAIN, LUMBAR, WITH RADICULOPATHY (ICD-724.4) Assessment New L sided buttock pain with rad to leg and calf  I agree that sciatic nerve could be involved  x ray LS today  continue flexeril  heat advised with stretching  handout given from [facility]  if nl looking film may consider some PT  Her updated medication list for this problem includes:    Aleve 220 Mg Tabs (Naproxen sodium) ..... Otc as directed.    Flexeril 10 Mg Tabs (Cyclobenzaprine hcl) .Marland Kitchen... 0.5-1 tab by mouth three times a day as needed for muscle pain, sedation caution  Orders: T-Lumbar Spine 2 Views (72100TC)  Complete Medication List: 1)  Multivitamins Tabs (Multiple vitamin) .... Takes 3 tablets by mouth daily 2)  Vitamin C 500 Mg Tabs (Ascorbic acid) .... Take 1 tablet by mouth three times a day 3)  Aleve 220 Mg Tabs (Naproxen sodium) .... Otc as directed. 4)  Miacalcin 200 Unit/ml Soln (Calcitonin (salmon)) .Marland Kitchen.. 1 spray in one nostril daily- alternate nostrils 5)  Vitamin D 2000 Unit Tabs (Cholecalciferol) .... Take 1 tablet by mouth once a day 6)  Flexeril 10 Mg Tabs (Cyclobenzaprine hcl) .... 0.5-1 tab by mouth three times a day as needed for muscle pain, sedation caution  Patient Instructions: 1)  x ray today 2)  continue flexeril if it helps  3)  keep walking / moving  4)  try heat on your back  5)  then I will update you   Orders Added: 1)  T-Lumbar Spine 2 Views [72100TC] 2)  Est. Patient Level IV [[address]]    Current Allergies (reviewed today): ! FOSAMAX ! ZOCOR EVISTA (RALOXIFENE HCL)

## 2010-11-17 ENCOUNTER — Other Ambulatory Visit: Payer: Self-pay | Admitting: Family Medicine

## 2010-11-17 DIAGNOSIS — Z1231 Encounter for screening mammogram for malignant neoplasm of breast: Secondary | ICD-10-CM

## 2010-12-04 ENCOUNTER — Ambulatory Visit: Payer: Self-pay

## 2010-12-11 ENCOUNTER — Ambulatory Visit
Admission: RE | Admit: 2010-12-11 | Discharge: 2010-12-11 | Disposition: A | Discharge status: Home / Self Care | Source: Ambulatory Visit | Attending: Family Medicine | Admitting: Family Medicine

## 2010-12-11 DIAGNOSIS — Z1231 Encounter for screening mammogram for malignant neoplasm of breast: Secondary | ICD-10-CM

## 2010-12-16 ENCOUNTER — Encounter: Payer: Self-pay | Admitting: *Deleted

## 2011-01-20 ENCOUNTER — Other Ambulatory Visit: Payer: Self-pay | Admitting: *Deleted

## 2011-01-20 NOTE — Telephone Encounter (Signed)
Pending f/u with Dr. Karie Schwalbe for this, will hold on prescription for now.  Denied.

## 2011-04-20 ENCOUNTER — Encounter: Payer: Self-pay | Admitting: Family Medicine

## 2011-04-21 ENCOUNTER — Encounter: Payer: Self-pay | Admitting: Family Medicine

## 2011-04-21 ENCOUNTER — Telehealth: Payer: Self-pay | Admitting: *Deleted

## 2011-04-21 ENCOUNTER — Ambulatory Visit (INDEPENDENT_AMBULATORY_CARE_PROVIDER_SITE_OTHER): Admitting: Family Medicine

## 2011-04-21 DIAGNOSIS — E785 Hyperlipidemia, unspecified: Secondary | ICD-10-CM

## 2011-04-21 DIAGNOSIS — M2550 Pain in unspecified joint: Secondary | ICD-10-CM

## 2011-04-21 DIAGNOSIS — J329 Chronic sinusitis, unspecified: Secondary | ICD-10-CM | POA: Insufficient documentation

## 2011-04-21 LAB — CBC WITH DIFFERENTIAL/PLATELET
Basophils Absolute: 0 10*3/uL (ref 0.0–0.1)
Hemoglobin: 13.3 g/dL (ref 12.0–15.0)
MCV: 87.2 fl (ref 78.0–100.0)
Neutro Abs: 4.5 10*3/uL (ref 1.4–7.7)
Neutrophils Relative %: 69.1 % (ref 43.0–77.0)
Platelets: 207 10*3/uL (ref 150.0–400.0)

## 2011-04-21 LAB — SEDIMENTATION RATE: Sed Rate: 26 mm/hr — ABNORMAL HIGH (ref 0–22)

## 2011-04-21 MED ORDER — CALCITONIN (SALMON) 200 UNIT/ACT NA SOLN: 1.0000 | Freq: Every day | NASAL | Status: DC

## 2011-04-21 MED ORDER — AMOXICILLIN-POT CLAVULANATE 875-125 MG PO TABS: 1.0000 | ORAL_TABLET | Freq: Two times a day (BID) | ORAL | Status: DC

## 2011-04-21 MED ORDER — AMOXICILLIN-POT CLAVULANATE 600-42.9 MG/5ML PO SUSR: 600.0000 mg | Freq: Two times a day (BID) | ORAL | Status: AC

## 2011-04-21 NOTE — Assessment & Plan Note (Signed)
1 month of congestion/ facial pain tx with augmentin sympt care- in AVS Nasal saline Update if worse or not imp in 10 d

## 2011-04-21 NOTE — Assessment & Plan Note (Signed)
Pt has failed 2 statins  Disc low sat fat diet  Will disc further and make plan at f/u

## 2011-04-21 NOTE — Telephone Encounter (Signed)
That is fine  Px sent electronically

## 2011-04-21 NOTE — Progress Notes (Signed)
Subjective:    Patient ID: Ashley Rasmussen, female    DOB: [birth year], 60 y.o.   MRN: [id number]  HPI Here for uri symptoms and also knee/ joint pain   Has had uri symptoms about a month- they wax and wane  Bad headache - in face and pain behind the eyes (uses a cold compress) Runny nose with post nasal drip that is thick  Took some allergy/ cold med and it helped a bit- one for congestion  Has not had a fever with this - but is achey A little cough- not bad or productive    Joints are generally achey and stiff in the ams Is on her feet all day long Knees are the worse  Hot bath helps quite a bit  ? If joints swell No rash  Thinks mother may have had autoimmune type of arthritis Does not hurt in muscles between the joints as a rule  Takes aleve prn -- not all the time   Cholesterol med made her worse - so she stopped it  Feels better now Trying to eat a low fat diet  avoding red meat and fried foods  Has proved intol to 2 statins now  Patient Active Problem List  Diagnoses  . HYPERLIPIDEMIA  . CONJUNCTIVITIS, ALLERGIC  . ALLERGIC RHINITIS  . OSTEOPOROSIS  . BREAST CANCER, HX OF  . BACK PAIN, LUMBAR, WITH RADICULOPATHY  . Sinusitis  . Joint pain   Past Medical History  Diagnosis Date  . Allergic rhinitis, cause unspecified   . Personal history of malignant neoplasm of breast     needs yearly mammogram and MRI every 2  . Osteopenia   . HLD (hyperlipidemia)   . Osteoporosis, unspecified   . Thoracic or lumbosacral neuritis or radiculitis, unspecified    Past Surgical History  Procedure Date  . Breast cancer excision   . Latissimus flap to breast     due to radiation burn   History  Substance Use Topics  . Smoking status: Former Games developer  . Smokeless tobacco: Not on file  . Alcohol Use: No   Family History  Problem Relation Age of Onset  . Other Mother     B-12 deficiency  . Breast cancer Mother   . Melanoma Mother   . Myelodysplastic syndrome Mother     . Osteoporosis Mother   . Other Sister     B-12 deficiency  . Breast cancer Sister   . Osteoporosis Sister   . Breast cancer      Multiple cousins   Allergies  Allergen Reactions  . Alendronate Sodium     REACTION: GI side effects  . Pravachol     myalgias  . Raloxifene     REACTION: breast tenderness and leg pain  . Simvastatin     REACTION: myalgias   Current Outpatient Prescriptions on File Prior to Visit  Medication Sig Dispense Refill  . Multiple Vitamin (MULTIVITAMIN) tablet Take 3 tablets by mouth daily.       . cholecalciferol (VITAMIN D) 1000 UNITS tablet Take 2,000 Units by mouth daily.        . cyclobenzaprine (FLEXERIL) 10 MG tablet Take 5-10 mg by mouth 3 (three) times daily as needed.        . naproxen sodium (ANAPROX) 220 MG tablet Take 220 mg by mouth as directed.        . vitamin C (ASCORBIC ACID) 500 MG tablet Take 500 mg by mouth 3 (three) times daily.  Review of Systems Review of Systems  Constitutional: Negative for fever, appetite change, fatigue and unexpected weight change.  Eyes: Negative for pain and visual disturbance.  ENT pos for sinus pain and congestion, some ear pressure  Respiratory: Negative for  shortness of breath.  , some cough Cardiovascular: Negative for cp or palpitations    Gastrointestinal: Negative for nausea, diarrhea and constipation.  Genitourinary: Negative for urgency and frequency.  Skin: Negative for pallor or rash   MSK pos for joint stiffness/ no joint redness or swelling  Neurological: Negative for weakness, light-headedness, numbness and headaches.  Hematological: Negative for adenopathy. Does not bruise/bleed easily.  Psychiatric/Behavioral: Negative for dysphoric mood. The patient is not nervous/anxious.          Objective:   Physical Exam  Constitutional: She appears well-nourished. No distress.  HENT:  Head: Normocephalic and atraumatic.  Right Ear: External ear normal.  Left Ear: External ear  normal.       Nares are congested and injected bilat  L maxillary and ethmoid sinus tenderness Some post nasal drip No throat redness   Eyes: Conjunctivae and EOM are normal. Pupils are equal, round, and reactive to light. Right eye exhibits no discharge. Left eye exhibits no discharge.  Neck: Normal range of motion. Neck supple. No JVD present. Carotid bruit is not present. No thyromegaly present.  Cardiovascular: Normal rate, regular rhythm, normal heart sounds and intact distal pulses.   Pulmonary/Chest: Effort normal and breath sounds normal. No respiratory distress. She has no wheezes.  Abdominal: Soft. Bowel sounds are normal. She exhibits no distension. There is no tenderness.  Musculoskeletal: Normal range of motion. She exhibits tenderness. She exhibits no edema.       Knees- some mild medial joint line tenderness Some mild pip joint tenderness No deformity Nl rom all joints No redness or rash  Lymphadenopathy:    She has no cervical adenopathy.  Neurological: She is alert. She has normal reflexes. She exhibits normal muscle tone. Coordination normal.  Skin: Skin is warm and dry. No rash noted. No erythema. No pallor.  Psychiatric: She has a normal mood and affect.          Assessment & Plan:

## 2011-04-21 NOTE — Patient Instructions (Signed)
Take augmentin for sinus infection Drink lots of fluids Try nasal saline spray Update me if worse or not improving  Labs for joint problems today and will update you  Always take food with aleve  Follow up in about 3 months  Avoid red meat/ fried foods/ egg yolks/ fatty breakfast meats/ butter, cheese and high fat dairy/ and shellfish

## 2011-04-21 NOTE — Telephone Encounter (Signed)
Pharmacist at [facility] called regarding pt's augmentin script that was sent in today.  Pt states she is unable to swallow the pills, so Amy asked if ok to switch to liquid augmentin, same dose.  Advised ok.

## 2011-04-21 NOTE — Assessment & Plan Note (Signed)
Joint pain and stiffness with family hx of RA Changes in fingers look like OA Lab today to r/o autoimmune disorder and disc difference Aleve prn with food - watching for side eff Disc imp of wt loss and staying active- esp for knees (low impact exercise) F/u 3 mo

## 2011-04-22 LAB — ANA: Anti Nuclear Antibody(ANA): NEGATIVE

## 2011-10-30 ENCOUNTER — Other Ambulatory Visit: Payer: Self-pay | Admitting: Family Medicine

## 2011-10-30 DIAGNOSIS — Z1231 Encounter for screening mammogram for malignant neoplasm of breast: Secondary | ICD-10-CM

## 2011-12-17 ENCOUNTER — Ambulatory Visit
Admission: RE | Admit: 2011-12-17 | Discharge: 2011-12-17 | Disposition: A | Discharge status: Home / Self Care | Source: Ambulatory Visit | Attending: Family Medicine | Admitting: Family Medicine

## 2011-12-17 DIAGNOSIS — Z1231 Encounter for screening mammogram for malignant neoplasm of breast: Secondary | ICD-10-CM

## 2011-12-18 ENCOUNTER — Encounter: Payer: Self-pay | Admitting: *Deleted

## 2012-06-21 ENCOUNTER — Ambulatory Visit (INDEPENDENT_AMBULATORY_CARE_PROVIDER_SITE_OTHER)

## 2012-06-21 ENCOUNTER — Ambulatory Visit

## 2012-06-21 DIAGNOSIS — Z23 Encounter for immunization: Secondary | ICD-10-CM

## 2012-07-06 ENCOUNTER — Encounter: Payer: Self-pay | Admitting: Family Medicine

## 2012-07-06 ENCOUNTER — Ambulatory Visit (INDEPENDENT_AMBULATORY_CARE_PROVIDER_SITE_OTHER): Admitting: Family Medicine

## 2012-07-06 VITALS — BP 140/90 | HR 98 | Temp 98.2°F | Ht 66.0 in | Wt 182.5 lb

## 2012-07-06 DIAGNOSIS — H6980 Other specified disorders of Eustachian tube, unspecified ear: Secondary | ICD-10-CM

## 2012-07-06 DIAGNOSIS — H6990 Unspecified Eustachian tube disorder, unspecified ear: Secondary | ICD-10-CM

## 2012-07-06 DIAGNOSIS — N814 Uterovaginal prolapse, unspecified: Secondary | ICD-10-CM

## 2012-07-06 MED ORDER — FLUTICASONE PROPIONATE 50 MCG/ACT NA SUSP: 2.0000 | Freq: Every day | NASAL | Status: DC

## 2012-07-06 NOTE — Progress Notes (Signed)
Subjective:    Patient ID: Ashley Rasmussen, female    DOB: [birth year], 61 y.o.   MRN: [id number]  HPI Here for possible uterine prolapse - cervix is coming out her vaginal opening - pressure but not pain  No bleeding  No d/c or discomfort  This is not keeping her from doing things Gained some weight back   No urinary incontinence   Also her ears are bothering her Pressure in them all the time  Makes her light headed at times   Patient Active Problem List  Diagnosis  . HYPERLIPIDEMIA  . CONJUNCTIVITIS, ALLERGIC  . ALLERGIC RHINITIS  . OSTEOPOROSIS  . BREAST CANCER, HX OF  . BACK PAIN, LUMBAR, WITH RADICULOPATHY  . Sinusitis  . Joint pain   Past Medical History  Diagnosis Date  . Allergic rhinitis, cause unspecified   . Personal history of malignant neoplasm of breast     needs yearly mammogram and MRI every 2  . Osteopenia   . HLD (hyperlipidemia)   . Osteoporosis, unspecified   . Thoracic or lumbosacral neuritis or radiculitis, unspecified    Past Surgical History  Procedure Date  . Breast cancer excision   . Latissimus flap to breast     due to radiation burn   History  Substance Use Topics  . Smoking status: Former Games developer  . Smokeless tobacco: Not on file     Comment: quit over 71yrs ago  . Alcohol Use: No   Family History  Problem Relation Age of Onset  . Other Mother     B-12 deficiency  . Breast cancer Mother   . Melanoma Mother   . Myelodysplastic syndrome Mother   . Osteoporosis Mother   . Other Sister     B-12 deficiency  . Breast cancer Sister   . Osteoporosis Sister   . Breast cancer      Multiple cousins   Allergies  Allergen Reactions  . Alendronate Sodium     REACTION: GI side effects  . Pravachol     myalgias  . Raloxifene     REACTION: breast tenderness and leg pain  . Simvastatin     REACTION: myalgias   Current Outpatient Prescriptions on File Prior to Visit  Medication Sig Dispense Refill  . calcitonin, salmon,  (MIACALCIN/FORTICAL) 200 UNIT/ACT nasal spray Place 1 spray into the nose daily. Alternate nostrils  7.4 mL  11  . Multiple Vitamin (MULTIVITAMIN) tablet Take 3 tablets by mouth daily.       . naproxen sodium (ALEVE) 220 MG tablet Take 440 mg by mouth 2 (two) times daily with a meal.        . naproxen sodium (ANAPROX) 220 MG tablet Take 220 mg by mouth as directed.            Review of Systems Review of Systems  Constitutional: Negative for fever, appetite change, fatigue and unexpected weight change.  ENt pos for chronic rhinorrhea and congestion/ neg for ST or ear pain (pos for pressure) Eyes: Negative for pain and visual disturbance.  Respiratory: Negative for cough and shortness of breath.   Cardiovascular: Negative for cp or palpitations    Gastrointestinal: Negative for nausea, diarrhea and constipation.  Genitourinary: Negative for urgency and frequency.  Skin: Negative for pallor or rash   Neurological: Negative for weakness, light-headedness, numbness and headaches.  Hematological: Negative for adenopathy. Does not bruise/bleed easily.  Psychiatric/Behavioral: Negative for dysphoric mood. The patient is not nervous/anxious.  Objective:   Physical Exam  Constitutional: She appears well-developed and well-nourished. No distress.       overwt and well appearing   HENT:  Head: Normocephalic and atraumatic.  Right Ear: External ear normal.  Left Ear: External ear normal.  Mouth/Throat: Oropharynx is clear and moist. No oropharyngeal exudate.       Boggy nares TMs clear/ but slt dull No sinus tenderness Throat is clear   Eyes: Conjunctivae normal and EOM are normal. Pupils are equal, round, and reactive to light. Right eye exhibits no discharge. Left eye exhibits no discharge. No scleral icterus.  Neck: Normal range of motion. Neck supple. No thyromegaly present.  Cardiovascular: Normal rate and regular rhythm.   Pulmonary/Chest: Effort normal and breath sounds  normal.  Abdominal: Soft. Bowel sounds are normal. She exhibits no distension and no mass. There is no tenderness.       No suprapubic tenderness or fullness    Genitourinary: Vagina normal. There is no rash or tenderness on the right labia. There is no rash or tenderness on the left labia. Uterus is not enlarged. Right adnexum displays no mass, no tenderness and no fullness. Left adnexum displays no mass, no tenderness and no fullness. No tenderness around the vagina.       Cervix does move down with valsalva, but not enough to prolapse  Mild cystocele No tenderness and no mass on bimanual   Lymphadenopathy:    She has no cervical adenopathy.  Neurological: She is alert. She has normal reflexes.  Skin: Skin is warm and dry. No rash noted. No erythema. No pallor.  Psychiatric: She has a normal mood and affect.          Assessment & Plan:

## 2012-07-06 NOTE — Patient Instructions (Addendum)
flonase will help open up your ears (and sinuses)- use it as directed  If no improvement, let me know  Your prolapse is very mild - let me know if symptoms worsen  Follow up in January as planned for your physical

## 2012-07-06 NOTE — Assessment & Plan Note (Signed)
Not detecting much on exam, no M or uterine enl  Very slt prolapse with valsalva Pt wants to watch this Will disc further at her PE and do pap

## 2012-07-06 NOTE — Assessment & Plan Note (Signed)
With hx of chronic rhinitis  Will try flonase ns  Update at Jan f/u- but call if any ear pain or fever

## 2012-08-08 ENCOUNTER — Telehealth: Payer: Self-pay | Admitting: Family Medicine

## 2012-08-08 DIAGNOSIS — Z0000 Encounter for general adult medical examination without abnormal findings: Secondary | ICD-10-CM | POA: Insufficient documentation

## 2012-08-08 DIAGNOSIS — E785 Hyperlipidemia, unspecified: Secondary | ICD-10-CM

## 2012-08-08 DIAGNOSIS — M810 Age-related osteoporosis without current pathological fracture: Secondary | ICD-10-CM

## 2012-08-08 NOTE — Telephone Encounter (Signed)
Message copied by Judy Pimple on Mon Aug 08, 2012  9:10 PM ------      Message from: Alvina Chou      Created: Fri Aug 05, 2012 11:27 AM      Regarding: Lab orders for Tuesday, 1.14.14       Patient is scheduled for CPX labs, please order future labs, Thanks , Camelia Eng

## 2012-08-09 ENCOUNTER — Other Ambulatory Visit (INDEPENDENT_AMBULATORY_CARE_PROVIDER_SITE_OTHER)

## 2012-08-09 DIAGNOSIS — E785 Hyperlipidemia, unspecified: Secondary | ICD-10-CM

## 2012-08-09 DIAGNOSIS — Z0000 Encounter for general adult medical examination without abnormal findings: Secondary | ICD-10-CM

## 2012-08-09 DIAGNOSIS — M810 Age-related osteoporosis without current pathological fracture: Secondary | ICD-10-CM

## 2012-08-09 LAB — CBC WITH DIFFERENTIAL/PLATELET
Basophils Relative: 0.4 % (ref 0.0–3.0)
Eosinophils Absolute: 0.2 10*3/uL (ref 0.0–0.7)
MCHC: 33.6 g/dL (ref 30.0–36.0)
MCV: 85.9 fl (ref 78.0–100.0)
Monocytes Absolute: 0.6 10*3/uL (ref 0.1–1.0)
Neutrophils Relative %: 64.1 % (ref 43.0–77.0)
Platelets: 217 10*3/uL (ref 150.0–400.0)

## 2012-08-09 LAB — COMPREHENSIVE METABOLIC PANEL
AST: 15 U/L (ref 0–37)
Albumin: 3.9 g/dL (ref 3.5–5.2)
Alkaline Phosphatase: 90 U/L (ref 39–117)
BUN: 19 mg/dL (ref 6–23)
Potassium: 4 mEq/L (ref 3.5–5.1)
Sodium: 141 mEq/L (ref 135–145)

## 2012-08-09 LAB — LIPID PANEL
Total CHOL/HDL Ratio: 5
Triglycerides: 131 mg/dL (ref 0.0–149.0)
VLDL: 26.2 mg/dL (ref 0.0–40.0)

## 2012-08-09 LAB — TSH: TSH: 1.08 u[IU]/mL (ref 0.35–5.50)

## 2012-08-16 ENCOUNTER — Ambulatory Visit (INDEPENDENT_AMBULATORY_CARE_PROVIDER_SITE_OTHER): Admitting: Family Medicine

## 2012-08-16 ENCOUNTER — Other Ambulatory Visit (HOSPITAL_COMMUNITY)
Admission: RE | Admit: 2012-08-16 | Discharge: 2012-08-16 | Disposition: A | Discharge status: Home / Self Care | Source: Ambulatory Visit | Attending: Family Medicine | Admitting: Family Medicine

## 2012-08-16 ENCOUNTER — Encounter: Payer: Self-pay | Admitting: Family Medicine

## 2012-08-16 ENCOUNTER — Encounter: Payer: Self-pay | Admitting: Gastroenterology

## 2012-08-16 VITALS — BP 130/80 | HR 86 | Temp 97.7°F | Ht 66.0 in | Wt 184.0 lb

## 2012-08-16 DIAGNOSIS — R7301 Impaired fasting glucose: Secondary | ICD-10-CM

## 2012-08-16 DIAGNOSIS — Z01419 Encounter for gynecological examination (general) (routine) without abnormal findings: Secondary | ICD-10-CM

## 2012-08-16 DIAGNOSIS — M810 Age-related osteoporosis without current pathological fracture: Secondary | ICD-10-CM

## 2012-08-16 DIAGNOSIS — E785 Hyperlipidemia, unspecified: Secondary | ICD-10-CM

## 2012-08-16 DIAGNOSIS — Z1211 Encounter for screening for malignant neoplasm of colon: Secondary | ICD-10-CM

## 2012-08-16 DIAGNOSIS — N814 Uterovaginal prolapse, unspecified: Secondary | ICD-10-CM

## 2012-08-16 DIAGNOSIS — Z853 Personal history of malignant neoplasm of breast: Secondary | ICD-10-CM

## 2012-08-16 DIAGNOSIS — Z0000 Encounter for general adult medical examination without abnormal findings: Secondary | ICD-10-CM

## 2012-08-16 NOTE — Patient Instructions (Addendum)
Sugar and cholesterol are a bit high Work hard on healthy diet and weight loss Avoid red meat/ fried foods/ egg yolks/ fatty breakfast meats/ butter, cheese and high fat dairy/ and shellfish   Follow up in 6 months with labs prior  We will refer you for colonoscopy and bone density tests at check out  If you are interested in a shingles/zoster vaccine - call your insurance to check on coverage,( you should not get it within 1 month of other vaccines) , then call us for a prescription  for it to take to a pharmacy that gives the shot , or make a nurse visit to get it here depending on your coverage When you are ready to see a gyn doctor to discuss possible hysterectomy call us and we will refer you

## 2012-08-16 NOTE — Assessment & Plan Note (Signed)
Pt is contemplating hysterectomy in the future in light of uterine prolapse and also fear of ovarian cancer (personal hx breast cancer)  She will request gyn ref for [facility] when she is ready to discuss it

## 2012-08-16 NOTE — Progress Notes (Signed)
Subjective:    Patient ID: Ashley Rasmussen, female    DOB: 1951-01-26, 62 y.o.   MRN: [id number]  HPI Here for health maintenance exam and to review chronic medical problems    Overall is doing very well  The flonase is helping her ears   Wt is up 2 lb Lost wt and then gained it back again  Needs to loose wt again  She needs to start some exercise   OP dexa 9/11 Ca and D D level 39 Needs to set that up in [place]  Taking miacalcin (has really odd side effects of bad dreams) Intol of evista and fosamax   Needs to schedule screening colonoscopy  No bowel changes She is a breast cancer survivor    Zoster status Has not had vaccine  Had chicken pox as a baby   mammo 5/13 Hx of breast cancer She will schedule her own test  No lumps   No gyn/ female problems except prolapse (does not want a pessary) Wants to do her pap No hx of abn paps   bp is up a bit  today   (lot of stress lately) No cp or palpitations or headaches or edema  No side effects to medicines  BP Readings from Last 3 Encounters:  08/16/12 148/96  07/06/12 140/90  04/21/11 130/76      Hyperlipidemia  Lab Results  Component Value Date   CHOL 206* 08/09/2012   CHOL 218* 03/06/2010   CHOL 232* 11/28/2009   Lab Results  Component Value Date   HDL 41.50 08/09/2012   HDL 40.98 03/06/2010   HDL 11.91 11/28/2009   Lab Results  Component Value Date   LDLCALC 158* 04/06/2008   Lab Results  Component Value Date   TRIG 131.0 08/09/2012   TRIG 121.0 03/06/2010   TRIG 134.0 11/28/2009   Lab Results  Component Value Date   CHOLHDL 5 08/09/2012   CHOLHDL 5 03/06/2010   CHOLHDL 4 11/28/2009   Lab Results  Component Value Date   LDLDIRECT 140.9 08/09/2012   LDLDIRECT 158.5 03/06/2010   LDLDIRECT 165.9 11/28/2009   intolerant to simvastatin   Glucose 115   Patient Active Problem List  Diagnosis  . HYPERLIPIDEMIA  . CONJUNCTIVITIS, ALLERGIC  . ALLERGIC RHINITIS  . OSTEOPOROSIS  . BREAST CANCER, HX OF    . BACK PAIN, LUMBAR, WITH RADICULOPATHY  . Sinusitis  . Joint pain  . Uterine prolapse  . ETD (eustachian tube dysfunction)  . Routine general medical examination at a health care facility  . Routine gynecological examination  . Colon cancer screening  . Fasting hyperglycemia   Past Medical History  Diagnosis Date  . Allergic rhinitis, cause unspecified   . Personal history of malignant neoplasm of breast     needs yearly mammogram and MRI every 2  . Osteopenia   . HLD (hyperlipidemia)   . Osteoporosis, unspecified   . Thoracic or lumbosacral neuritis or radiculitis, unspecified    Past Surgical History  Procedure Date  . Breast cancer excision   . Latissimus flap to breast     due to radiation burn   History  Substance Use Topics  . Smoking status: Former Games developer  . Smokeless tobacco: Not on file     Comment: quit over 71yrs ago  . Alcohol Use: No   Family History  Problem Relation Age of Onset  . Other Mother     B-12 deficiency  . Breast cancer Mother   . Melanoma  Mother   . Myelodysplastic syndrome Mother   . Osteoporosis Mother   . Other Sister     B-12 deficiency  . Breast cancer Sister   . Osteoporosis Sister   . Breast cancer      Multiple cousins   Allergies  Allergen Reactions  . Alendronate Sodium     REACTION: GI side effects  . Pravachol     myalgias  . Raloxifene     REACTION: breast tenderness and leg pain  . Simvastatin     REACTION: myalgias   Current Outpatient Prescriptions on File Prior to Visit  Medication Sig Dispense Refill  . calcitonin, salmon, (MIACALCIN/FORTICAL) 200 UNIT/ACT nasal spray Place 1 spray into the nose daily. Alternate nostrils  7.4 mL  11  . fluticasone (FLONASE) 50 MCG/ACT nasal spray Place 2 sprays into the nose as needed.      . Multiple Vitamin (MULTIVITAMIN) tablet Take 3 tablets by mouth daily.       . naproxen sodium (ALEVE) 220 MG tablet Take 440 mg by mouth 2 (two) times daily with a meal.        .  naproxen sodium (ANAPROX) 220 MG tablet Take 220 mg by mouth as directed.           Review of Systems Review of Systems  Constitutional: Negative for fever, appetite change, fatigue and unexpected weight change.  Eyes: Negative for pain and visual disturbance.  Respiratory: Negative for cough and shortness of breath.   Cardiovascular: Negative for cp or palpitations    Gastrointestinal: Negative for nausea, diarrhea and constipation.  Genitourinary: Negative for urgency and frequency. pos for uterine prolapse Skin: Negative for pallor or rash   Neurological: Negative for weakness, light-headedness, numbness and headaches.  Hematological: Negative for adenopathy. Does not bruise/bleed easily.  Psychiatric/Behavioral: Negative for dysphoric mood. The patient is not nervous/anxious.         Objective:   Physical Exam  Constitutional: She appears well-developed and well-nourished. No distress.  HENT:  Head: Normocephalic and atraumatic.  Right Ear: External ear normal.  Left Ear: External ear normal.  Nose: Nose normal.  Mouth/Throat: Oropharynx is clear and moist.  Eyes: Conjunctivae normal and EOM are normal. Pupils are equal, round, and reactive to light. Right eye exhibits no discharge. Left eye exhibits no discharge. No scleral icterus.  Neck: Normal range of motion. Neck supple. No JVD present. Carotid bruit is not present. No thyromegaly present.  Cardiovascular: Normal rate, regular rhythm, normal heart sounds and intact distal pulses.  Exam reveals no gallop.   Pulmonary/Chest: Effort normal and breath sounds normal. No respiratory distress. She has no wheezes. She exhibits no tenderness.  Abdominal: Soft. Bowel sounds are normal. She exhibits no distension, no abdominal bruit and no mass. There is no tenderness.  Genitourinary: No breast swelling, tenderness, discharge or bleeding. There is no rash, tenderness or lesion on the right labia. There is no rash, tenderness or lesion  on the left labia. Uterus is not enlarged and not tender. Cervix exhibits no motion tenderness, no discharge and no friability. Right adnexum displays no mass, no tenderness and no fullness. Left adnexum displays no mass, no tenderness and no fullness. No bleeding around the vagina. No vaginal discharge found.       Mild uterine prolapse noted   Breast exam: No mass, nodules, thickening, tenderness, bulging, retraction, inflamation, nipple discharge or skin changes noted.  No axillary or clavicular LA. Baseline surgical changes in R breast  Chaperoned exam.  Musculoskeletal: She exhibits no edema and no tenderness.  Lymphadenopathy:    She has no cervical adenopathy.  Neurological: She is alert. She has normal reflexes. No cranial nerve deficit. She exhibits normal muscle tone. Coordination normal.  Skin: Skin is warm and dry. No rash noted.  Psychiatric: She has a normal mood and affect.          Assessment & Plan:

## 2012-08-16 NOTE — Assessment & Plan Note (Signed)
Routine exam with pap  Prolapse noted Pt contemplating total hyst for this and fear of ovarian ca

## 2012-08-16 NOTE — Assessment & Plan Note (Signed)
Chol improved but still higher than optimal Disc goals for lipids and reasons to control them Rev labs with pt Rev low sat fat diet in detail  Re check 6 mo after diet and f/u

## 2012-08-16 NOTE — Assessment & Plan Note (Signed)
No change in exam Enc self exams  Mam utd - she will schedule next one in may

## 2012-08-16 NOTE — Assessment & Plan Note (Signed)
Reviewed health habits including diet and exercise and skin cancer prevention Also reviewed health mt list, fam hx and immunizations  Wellness labs reviewed in detail colonosc for screening planned

## 2012-08-16 NOTE — Assessment & Plan Note (Signed)
Ref for first screening colonoscopy  

## 2012-08-16 NOTE — Assessment & Plan Note (Signed)
New and mild Obese- disc need for wt loss with low glycemic diet and exercise to prevent DM  F/u 6 mo with a1c prior

## 2012-08-16 NOTE — Assessment & Plan Note (Signed)
Due for dexa On miacalcin Intol of bisphosphenates and evista  No fragility fx or ht loss

## 2012-08-22 ENCOUNTER — Encounter: Payer: Self-pay | Admitting: *Deleted

## 2012-09-08 ENCOUNTER — Inpatient Hospital Stay: Admission: RE | Admit: 2012-09-08 | Source: Ambulatory Visit

## 2012-09-08 ENCOUNTER — Encounter

## 2012-09-22 ENCOUNTER — Encounter: Admitting: Gastroenterology

## 2012-10-04 ENCOUNTER — Encounter

## 2012-10-18 ENCOUNTER — Encounter: Admitting: Gastroenterology

## 2012-12-13 ENCOUNTER — Ambulatory Visit (INDEPENDENT_AMBULATORY_CARE_PROVIDER_SITE_OTHER): Admitting: Family Medicine

## 2012-12-13 ENCOUNTER — Encounter: Payer: Self-pay | Admitting: Family Medicine

## 2012-12-13 VITALS — BP 144/92 | HR 84 | Temp 98.4°F | Ht 66.0 in | Wt 190.8 lb

## 2012-12-13 DIAGNOSIS — E669 Obesity, unspecified: Secondary | ICD-10-CM | POA: Insufficient documentation

## 2012-12-13 DIAGNOSIS — J309 Allergic rhinitis, unspecified: Secondary | ICD-10-CM

## 2012-12-13 DIAGNOSIS — B079 Viral wart, unspecified: Secondary | ICD-10-CM | POA: Insufficient documentation

## 2012-12-13 MED ORDER — CALCITONIN (SALMON) 200 UNIT/ACT NA SOLN: 1.0000 | Freq: Every day | NASAL | Status: DC

## 2012-12-13 MED ORDER — FLUTICASONE PROPIONATE 50 MCG/ACT NA SUSP: 2.0000 | NASAL | Status: DC | PRN

## 2012-12-13 NOTE — Assessment & Plan Note (Signed)
Seasonal - tree pollen related with nasal and ear symptoms Refilled flonase-that works well for her

## 2012-12-13 NOTE — Assessment & Plan Note (Signed)
Keratotic lesion R arm - treated with cryotx - freeze and thaw times 3 Disc aftercare  If not imp will leg me know  Aside- has fam hx of melanoma and I recommended yearly derm visit as well

## 2012-12-13 NOTE — Patient Instructions (Signed)
Work on weight loss- add 30 minutes of exercise 5 days per week Cut out sugar and cut portions  We treated lesion on arm (wart) - by freezing it-keep clean and loosely covered - it will blister and peel and then if still there let me know  You should see a dermatologist yearly for family history of melanoma  Don't forget to get your mammogram

## 2012-12-13 NOTE — Assessment & Plan Note (Signed)
Discussed how this problem influences overall health and the risks it imposes  Reviewed plan for weight loss with lower calorie diet (via better food choices and also portion control or program like weight watchers) and exercise building up to or more than 30 minutes 5 days per week including some aerobic activity    

## 2012-12-13 NOTE — Progress Notes (Signed)
Subjective:    Patient ID: Ashley Rasmussen, female    DOB: [birth year], 62 y.o.   MRN: [id number]  HPI Here for lump that came off of her arm --? Turning into a mole - actually got smaller Is just under the skin  2-3 weeks - no itch or pain  Keeps "knocking the top off it " ? If it is a wart   Needs flonase refilled  Runny and stuffy nose and stuffy ears - tree pollen allergy St at times  No facial pain   Has eaten better- trying to loose weight  Is eating some salads  Some exercise- walking -also more active job  Obese  Will swim this summer   Patient Active Problem List   Diagnosis Date Noted  . Obesity 12/13/2012  . Wart 12/13/2012  . Routine gynecological examination 08/16/2012  . Colon cancer screening 08/16/2012  . Fasting hyperglycemia 08/16/2012  . Routine general medical examination at a health care facility 08/08/2012  . Uterine prolapse 07/06/2012  . ETD (eustachian tube dysfunction) 07/06/2012  . Sinusitis 04/21/2011  . Joint pain 04/21/2011  . BACK PAIN, LUMBAR, WITH RADICULOPATHY 08/15/2010  . HYPERLIPIDEMIA 04/13/2008  . OSTEOPOROSIS 04/06/2008  . CONJUNCTIVITIS, ALLERGIC 04/15/2007  . ALLERGIC RHINITIS 04/15/2007  . BREAST CANCER, HX OF 04/15/2007   Past Medical History  Diagnosis Date  . Allergic rhinitis, cause unspecified   . Personal history of malignant neoplasm of breast     needs yearly mammogram and MRI every 2  . Osteopenia   . HLD (hyperlipidemia)   . Osteoporosis, unspecified   . Thoracic or lumbosacral neuritis or radiculitis, unspecified    Past Surgical History  Procedure Laterality Date  . Breast cancer excision    . Latissimus flap to breast      due to radiation burn   History  Substance Use Topics  . Smoking status: Former Games developer  . Smokeless tobacco: Not on file     Comment: quit over 80yrs ago  . Alcohol Use: No   Family History  Problem Relation Age of Onset  . Other Mother     B-12 deficiency  . Breast cancer  Mother   . Melanoma Mother   . Myelodysplastic syndrome Mother   . Osteoporosis Mother   . Other Sister     B-12 deficiency  . Breast cancer Sister   . Osteoporosis Sister   . Breast cancer      Multiple cousins   Allergies  Allergen Reactions  . Alendronate Sodium     REACTION: GI side effects  . Pravachol     myalgias  . Raloxifene     REACTION: breast tenderness and leg pain  . Simvastatin     REACTION: myalgias   Current Outpatient Prescriptions on File Prior to Visit  Medication Sig Dispense Refill  . Multiple Vitamin (MULTIVITAMIN) tablet Take 3 tablets by mouth daily.       . naproxen sodium (ALEVE) 220 MG tablet Take 440 mg by mouth as needed.        No current facility-administered medications on file prior to visit.    Review of Systems Review of Systems  Constitutional: Negative for fever, appetite change, fatigue and unexpected weight change.  Eyes: Negative for pain and visual disturbance.  ENT pos for congestion and runny nose and sneezing/ neg for facial pain  Respiratory: Negative for cough and shortness of breath.  neg for wheezing  Cardiovascular: Negative for cp or palpitations    Gastrointestinal:  Negative for nausea, diarrhea and constipation.  Genitourinary: Negative for urgency and frequency.  Skin: Negative for pallor or rash  pos for lesion on arm Neurological: Negative for weakness, light-headedness, numbness and headaches.  Hematological: Negative for adenopathy. Does not bruise/bleed easily.  Psychiatric/Behavioral: Negative for dysphoric mood. The patient is not nervous/anxious.         Objective:   Physical Exam  Constitutional: She appears well-developed and well-nourished. No distress.  HENT:  Head: Normocephalic and atraumatic.  Right Ear: External ear normal.  Left Ear: External ear normal.  Mouth/Throat: No oropharyngeal exudate.  Nares are pale and boggy with clear rhinorrhea   Eyes: Conjunctivae and EOM are normal. Pupils are  equal, round, and reactive to light. Right eye exhibits no discharge. Left eye exhibits no discharge. No scleral icterus.  Neck: Normal range of motion. Neck supple.  Cardiovascular: Normal rate and regular rhythm.  Exam reveals no gallop.   Pulmonary/Chest: Effort normal and breath sounds normal. No respiratory distress. She has no wheezes. She has no rales.  Musculoskeletal: She exhibits no edema.  Lymphadenopathy:    She has no cervical adenopathy.  Neurological: She is alert.  Skin: Skin is warm and dry. No rash noted. No erythema.  3-4 mm raised round keratotic area on R forearm resembles wart tx with cryo tx - freeze and thaw times 3 -pt tolerated well  Dressed with band aid   Psychiatric: She has a normal mood and affect.          Assessment & Plan:

## 2013-01-18 ENCOUNTER — Telehealth: Payer: Self-pay | Admitting: Family Medicine

## 2013-01-18 DIAGNOSIS — B079 Viral wart, unspecified: Secondary | ICD-10-CM

## 2013-01-18 NOTE — Telephone Encounter (Signed)
Pt called and would like a referral to a dermatologist in the [facility].   Best number for patient is [phone]

## 2013-01-19 NOTE — Addendum Note (Signed)
Addended by: Roxy Manns A on: 01/19/2013 09:54 PM   Modules accepted: Orders

## 2013-01-19 NOTE — Telephone Encounter (Signed)
Will refer - let her know Shirlee Limerick will call

## 2013-01-23 ENCOUNTER — Other Ambulatory Visit: Payer: Self-pay

## 2013-01-23 DIAGNOSIS — Z9011 Acquired absence of right breast and nipple: Secondary | ICD-10-CM

## 2013-01-23 DIAGNOSIS — Z1231 Encounter for screening mammogram for malignant neoplasm of breast: Secondary | ICD-10-CM

## 2013-02-16 ENCOUNTER — Other Ambulatory Visit: Payer: Self-pay

## 2013-02-16 ENCOUNTER — Ambulatory Visit
Admission: RE | Admit: 2013-02-16 | Discharge: 2013-02-16 | Disposition: A | Discharge status: Home / Self Care | Source: Ambulatory Visit

## 2013-02-16 DIAGNOSIS — Z9011 Acquired absence of right breast and nipple: Secondary | ICD-10-CM

## 2013-02-16 DIAGNOSIS — Z1231 Encounter for screening mammogram for malignant neoplasm of breast: Secondary | ICD-10-CM

## 2013-02-20 ENCOUNTER — Encounter: Payer: Self-pay | Admitting: *Deleted

## 2013-05-22 ENCOUNTER — Ambulatory Visit

## 2013-05-23 ENCOUNTER — Ambulatory Visit (INDEPENDENT_AMBULATORY_CARE_PROVIDER_SITE_OTHER)

## 2013-05-23 DIAGNOSIS — Z23 Encounter for immunization: Secondary | ICD-10-CM

## 2013-08-17 ENCOUNTER — Ambulatory Visit (INDEPENDENT_AMBULATORY_CARE_PROVIDER_SITE_OTHER): Admitting: Internal Medicine

## 2013-08-17 ENCOUNTER — Encounter: Payer: Self-pay | Admitting: Internal Medicine

## 2013-08-17 VITALS — BP 130/84 | HR 125 | Temp 99.5°F | Wt 177.0 lb

## 2013-08-17 DIAGNOSIS — J029 Acute pharyngitis, unspecified: Secondary | ICD-10-CM

## 2013-08-17 DIAGNOSIS — H65199 Other acute nonsuppurative otitis media, unspecified ear: Secondary | ICD-10-CM

## 2013-08-17 MED ORDER — AMOXICILLIN 875 MG PO TABS: 875.0000 mg | ORAL_TABLET | Freq: Two times a day (BID) | ORAL | Status: DC

## 2013-08-17 NOTE — Progress Notes (Signed)
Pre-visit discussion using our clinic review tool. No additional management support is needed unless otherwise documented below in the visit note.  

## 2013-08-17 NOTE — Patient Instructions (Signed)
Cerumen Impaction A cerumen impaction is when the wax in your ear forms a plug. This plug usually causes reduced hearing. Sometimes it also causes an earache or dizziness. Removing a cerumen impaction can be difficult and painful. The wax sticks to the ear canal. The canal is sensitive and bleeds easily. If you try to remove a heavy wax buildup with a cotton tipped swab, you may push it in further. Irrigation with water, suction, and small ear curettes may be used to clear out the wax. If the impaction is fixed to the skin in the ear canal, ear drops may be needed for a few days to loosen the wax. People who build up a lot of wax frequently can use ear wax removal products available in your local drugstore. SEEK MEDICAL CARE IF:  You develop an earache, increased hearing loss, or marked dizziness. Document Released: 08/20/2004 Document Revised: 10/05/2011 Document Reviewed: 10/10/2009 Exit[employer] Patient Information 2014 [employer].  

## 2013-08-17 NOTE — Progress Notes (Signed)
HPI  Pt presents to the clinic today with c/o ear pain and pressure, sore throat and chills. This started about 3-4 days ago. She has not really been feeling well over the last 2-3 weeks. She has taken an antihistamine OTC without much relief. She has had sick contacts. She does have a history of allergies.  Review of Systems      Past Medical History  Diagnosis Date  . Allergic rhinitis, cause unspecified   . Personal history of malignant neoplasm of breast     needs yearly mammogram and MRI every 2  . Osteopenia   . HLD (hyperlipidemia)   . Osteoporosis, unspecified   . Thoracic or lumbosacral neuritis or radiculitis, unspecified     Family History  Problem Relation Age of Onset  . Other Mother     B-12 deficiency  . Breast cancer Mother   . Melanoma Mother   . Myelodysplastic syndrome Mother   . Osteoporosis Mother   . Other Sister     B-12 deficiency  . Breast cancer Sister   . Osteoporosis Sister   . Breast cancer      Multiple cousins    History   Social History  . Marital Status: Married    Spouse Name: N/A    Number of Children: N/A  . Years of Education: N/A   Occupational History  . Not on file.   Social History Main Topics  . Smoking status: Former Research scientist (life sciences)  . Smokeless tobacco: Not on file     Comment: quit over 30yrs ago  . Alcohol Use: No  . Drug Use: No  . Sexual Activity: Not on file   Other Topics Concern  . Not on file   Social History Narrative   Married      4 children          Allergies  Allergen Reactions  . Alendronate Sodium     REACTION: GI side effects  . Pravachol     myalgias  . Raloxifene     REACTION: breast tenderness and leg pain  . Simvastatin     REACTION: myalgias     Constitutional: Positive headache, fatigue and fever. Denies abrupt weight changes.  HEENT:  Positive ear pain and sore throat. Denies eye redness, eye pain, pressure behind the eyes, facial pain, nasal congestion,ringing in the ears, wax  buildup, runny nose or bloody nose. Respiratory:  Denies cough, difficulty breathing or shortness of breath.  Cardiovascular: Denies chest pain, chest tightness, palpitations or swelling in the hands or feet.   No other specific complaints in a complete review of systems (except as listed in HPI above).  Objective:   BP 130/84  Pulse 125  Temp(Src) 99.5 F (37.5 C) (Oral)  Wt 177 lb (80.287 kg)  SpO2 98% Wt Readings from Last 3 Encounters:  08/17/13 177 lb (80.287 kg)  12/13/12 190 lb 12 oz (86.524 kg)  08/16/12 184 lb (83.462 kg)     General: Appears her stated age, well developed, well nourished in NAD. HEENT: Head: normal shape and size; Eyes: sclera white, no icterus, conjunctiva pink, PERRLA and EOMs intact; Ears: Bilateral cerumen impaction; Nose: mucosa pink and moist, septum midline; Throat/Mouth: . Teeth present, mucosa erythematous and moist, no exudate noted, no lesions or ulcerations noted.  Neck: Mild cervical lymphadenopathy. Neck supple, trachea midline. No massses, lumps or thyromegaly present.  Cardiovascular: Normal rate and rhythm. S1,S2 noted.  No murmur, rubs or gallops noted. No JVD or BLE edema. No  carotid bruits noted. Pulmonary/Chest: Normal effort and positive vesicular breath sounds. No respiratory distress. No wheezes, rales or ronchi noted.      Assessment & Plan:   Otitis Media with effusion, bilateral:  Ear lavaged by CMA to better visualize the TM Get some rest and drink plenty of water Do salt water gargles for the sore throat eRx for Amoxicillin BID x 10 days  RTC as needed or if symptoms persist.

## 2013-08-18 ENCOUNTER — Telehealth: Payer: Self-pay

## 2013-08-18 NOTE — Telephone Encounter (Signed)
Pt was seen 08/17/13 and pt can bearly hear out of ears and request ear drops to dissolve wax; pt is not having pain or pressure in ears. [place].pt request cb.

## 2013-08-18 NOTE — Telephone Encounter (Signed)
Left detailed message letting pt know the name of ear drops and they are OTC

## 2013-08-18 NOTE — Telephone Encounter (Signed)
The drops are OTC. They are called Debrox and she needs to use as directed

## 2013-08-21 ENCOUNTER — Ambulatory Visit (INDEPENDENT_AMBULATORY_CARE_PROVIDER_SITE_OTHER): Admitting: Family Medicine

## 2013-08-21 ENCOUNTER — Encounter: Payer: Self-pay | Admitting: Family Medicine

## 2013-08-21 VITALS — BP 126/82 | HR 100 | Wt 177.0 lb

## 2013-08-21 DIAGNOSIS — H6990 Unspecified Eustachian tube disorder, unspecified ear: Secondary | ICD-10-CM | POA: Insufficient documentation

## 2013-08-21 DIAGNOSIS — H6980 Other specified disorders of Eustachian tube, unspecified ear: Secondary | ICD-10-CM

## 2013-08-21 NOTE — Assessment & Plan Note (Signed)
Recommend flonase QD and to continue the zyrtec if helpful Also complete amoxil to tx OM  Disc symptomatic care - see instructions on AVS  Will call in 2 wk if no improvement for ref to ENT

## 2013-08-21 NOTE — Progress Notes (Signed)
Subjective:    Patient ID: Ashley Rasmussen, female    DOB: 06-05-1951, 63 y.o.   MRN: [id number]  HPI  Was in last Thursday- had bilat OM  Had attempted ear irrigation-did not get anything out Still on antibiotics amoxil  Did buy an otc wax product - used for 2 d- no improvement   Also started on oc antihistamine (store brand zyrtec) and this did help   R ear is a little tender Hearing is really diminished   Patient Active Problem List   Diagnosis Date Noted  . ETD (eustachian tube dysfunction) 08/21/2013  . Obesity 12/13/2012  . Routine gynecological examination 08/16/2012  . Colon cancer screening 08/16/2012  . Fasting hyperglycemia 08/16/2012  . Routine general medical examination at a health care facility 08/08/2012  . Uterine prolapse 07/06/2012  . BACK PAIN, LUMBAR, WITH RADICULOPATHY 08/15/2010  . HYPERLIPIDEMIA 04/13/2008  . OSTEOPOROSIS 04/06/2008  . ALLERGIC RHINITIS 04/15/2007  . BREAST CANCER, HX OF 04/15/2007   Past Medical History  Diagnosis Date  . Allergic rhinitis, cause unspecified   . Personal history of malignant neoplasm of breast     needs yearly mammogram and MRI every 2  . Osteopenia   . HLD (hyperlipidemia)   . Osteoporosis, unspecified   . Thoracic or lumbosacral neuritis or radiculitis, unspecified    Past Surgical History  Procedure Laterality Date  . Breast cancer excision    . Latissimus flap to breast      due to radiation burn   History  Substance Use Topics  . Smoking status: Former Research scientist (life sciences)  . Smokeless tobacco: Not on file     Comment: quit over 69yrs ago  . Alcohol Use: No   Family History  Problem Relation Age of Onset  . Other Mother     B-12 deficiency  . Breast cancer Mother   . Melanoma Mother   . Myelodysplastic syndrome Mother   . Osteoporosis Mother   . Other Sister     B-12 deficiency  . Breast cancer Sister   . Osteoporosis Sister   . Breast cancer      Multiple cousins   Allergies  Allergen Reactions    . Alendronate Sodium     REACTION: GI side effects  . Pravachol     myalgias  . Raloxifene     REACTION: breast tenderness and leg pain  . Simvastatin     REACTION: myalgias   Current Outpatient Prescriptions on File Prior to Visit  Medication Sig Dispense Refill  . amoxicillin (AMOXIL) 875 MG tablet Take 1 tablet (875 mg total) by mouth 2 (two) times daily.  20 tablet  0  . fluticasone (FLONASE) 50 MCG/ACT nasal spray Place 2 sprays into the nose as needed.  16 g  11  . Multiple Vitamin (MULTIVITAMIN) tablet Take 3 tablets by mouth daily.       . naproxen sodium (ALEVE) 220 MG tablet Take 440 mg by mouth as needed.        No current facility-administered medications on file prior to visit.    Review of Systems Review of Systems  Constitutional: Negative for fever, appetite change, fatigue and unexpected weight change.  ENT pos for nasal and sinus congestion / neg for ear pain or drainage/ pos for ear fullness and muffled hearing  Eyes: Negative for pain and visual disturbance.  Respiratory: Negative for wheeze  and shortness of breath.  pos for scant dry cough Cardiovascular: Negative for cp or palpitations  Gastrointestinal: Negative for nausea, diarrhea and constipation.  Genitourinary: Negative for urgency and frequency.  Skin: Negative for pallor or rash   Neurological: Negative for weakness, light-headedness, numbness and headaches.  Hematological: Negative for adenopathy. Does not bruise/bleed easily.  Psychiatric/Behavioral: Negative for dysphoric mood. The patient is not nervous/anxious.         Objective:   Physical Exam  Constitutional: She appears well-developed and well-nourished. No distress.  HENT:  Head: Normocephalic and atraumatic.  Mouth/Throat: Oropharynx is clear and moist. No oropharyngeal exudate.  Nares are injected and congested  No sinus tenderness Moderate TM effusions bilaterally with decreased hearing No cerumen or swelling of the canal   Eyes: Conjunctivae and EOM are normal. Pupils are equal, round, and reactive to light. Right eye exhibits no discharge. Left eye exhibits no discharge.  Neck: Normal range of motion. Neck supple.  Cardiovascular: Normal rate and regular rhythm.   Pulmonary/Chest: Effort normal and breath sounds normal.  Lymphadenopathy:    She has no cervical adenopathy.  Neurological: She is alert.  Skin: Skin is warm and dry. No rash noted. No pallor.  Psychiatric: She has a normal mood and affect.          Assessment & Plan:

## 2013-08-21 NOTE — Patient Instructions (Signed)
You have eustachian tube dysfunction  This means - along with an ear infection you have an effusion (a fluid collection) behind the ear drum that needs to clear -- this is related to the ear infection and sinus congestion No wax today  Continue your antibiotic Get back on flonase Use the over the counter antihistamine that Rob gave you If you are not improving in 2 weeks- please call so I can refer you to a specialist

## 2013-09-26 ENCOUNTER — Ambulatory Visit (INDEPENDENT_AMBULATORY_CARE_PROVIDER_SITE_OTHER): Admitting: Family Medicine

## 2013-09-26 ENCOUNTER — Encounter: Payer: Self-pay | Admitting: Family Medicine

## 2013-09-26 VITALS — BP 126/74 | HR 91 | Temp 98.2°F | Ht 66.0 in | Wt 175.0 lb

## 2013-09-26 DIAGNOSIS — R059 Cough, unspecified: Secondary | ICD-10-CM

## 2013-09-26 DIAGNOSIS — R05 Cough: Secondary | ICD-10-CM

## 2013-09-26 DIAGNOSIS — J019 Acute sinusitis, unspecified: Secondary | ICD-10-CM

## 2013-09-26 DIAGNOSIS — J111 Influenza due to unidentified influenza virus with other respiratory manifestations: Secondary | ICD-10-CM

## 2013-09-26 LAB — POCT INFLUENZA A/B
INFLUENZA A, POC: POSITIVE
Influenza B, POC: POSITIVE

## 2013-09-26 MED ORDER — AMOXICILLIN-POT CLAVULANATE 875-125 MG PO TABS: 1.0000 | ORAL_TABLET | Freq: Two times a day (BID) | ORAL | Status: DC

## 2013-09-26 NOTE — Patient Instructions (Signed)
Take the augmentin as directed for sinus infection  Drink lots of fluids and rest  Use saline nasal spray for congestion Tylenol for fever or aches  Update if not starting to improve in a week or if worsening

## 2013-09-26 NOTE — Progress Notes (Signed)
Subjective:    Patient ID: Ashley Rasmussen, female    DOB: 1950/10/05, 63 y.o.   MRN: [id number]  HPI Here with uri/. Flu like symptoms Has been sick for 3 weeks Gets chills and sweats at night - she has not take temp   (also body aches) Taking tylenol day and night -this helps a lot   Congested  L ear is sore  Some cough-not severe  Had pressure in face- that is a bit better  ST on and off   Has a bunch of family living in her house now- a lot going around   She did have a flu vaccine this season  Neg flu test today/ rapid     Patient Active Problem List   Diagnosis Date Noted  . ETD (eustachian tube dysfunction) 08/21/2013  . Obesity 12/13/2012  . Routine gynecological examination 08/16/2012  . Colon cancer screening 08/16/2012  . Fasting hyperglycemia 08/16/2012  . Routine general medical examination at a health care facility 08/08/2012  . Uterine prolapse 07/06/2012  . BACK PAIN, LUMBAR, WITH RADICULOPATHY 08/15/2010  . HYPERLIPIDEMIA 04/13/2008  . OSTEOPOROSIS 04/06/2008  . ALLERGIC RHINITIS 04/15/2007  . BREAST CANCER, HX OF 04/15/2007   Past Medical History  Diagnosis Date  . Allergic rhinitis, cause unspecified   . Personal history of malignant neoplasm of breast     needs yearly mammogram and MRI every 2  . Osteopenia   . HLD (hyperlipidemia)   . Osteoporosis, unspecified   . Thoracic or lumbosacral neuritis or radiculitis, unspecified    Past Surgical History  Procedure Laterality Date  . Breast cancer excision    . Latissimus flap to breast      due to radiation burn   History  Substance Use Topics  . Smoking status: Former Research scientist (life sciences)  . Smokeless tobacco: Not on file     Comment: quit over 83yrs ago  . Alcohol Use: No   Family History  Problem Relation Age of Onset  . Other Mother     B-12 deficiency  . Breast cancer Mother   . Melanoma Mother   . Myelodysplastic syndrome Mother   . Osteoporosis Mother   . Other Sister     B-12  deficiency  . Breast cancer Sister   . Osteoporosis Sister   . Breast cancer      Multiple cousins   Allergies  Allergen Reactions  . Alendronate Sodium     REACTION: GI side effects  . Pravachol     myalgias  . Raloxifene     REACTION: breast tenderness and leg pain  . Simvastatin     REACTION: myalgias   Current Outpatient Prescriptions on File Prior to Visit  Medication Sig Dispense Refill  . fluticasone (FLONASE) 50 MCG/ACT nasal spray Place 2 sprays into the nose as needed.  16 g  11  . Multiple Vitamin (MULTIVITAMIN) tablet Take 3 tablets by mouth daily.       . naproxen sodium (ALEVE) 220 MG tablet Take 440 mg by mouth as needed.        No current facility-administered medications on file prior to visit.       Review of Systems Review of Systems  Constitutional: Negative for , appetite change, and unexpected weight change.  ENt pos for congestion and rhinorrhea and facial /ear pain Eyes: Negative for pain and visual disturbance.  Respiratory: Negative for wheeze  and shortness of breath.   Cardiovascular: Negative for cp or palpitations  Gastrointestinal: Negative for nausea, diarrhea and constipation.  Genitourinary: Negative for urgency and frequency.  Skin: Negative for pallor or rash   Neurological: Negative for weakness, light-headedness, numbness and headaches.  Hematological: Negative for adenopathy. Does not bruise/bleed easily.  Psychiatric/Behavioral: Negative for dysphoric mood. The patient is not nervous/anxious.         Objective:   Physical Exam  Constitutional: She appears well-developed and well-nourished. No distress.  HENT:  Head: Normocephalic and atraumatic.  Right Ear: External ear normal.  Left Ear: External ear normal.  Mouth/Throat: Oropharynx is clear and moist. No oropharyngeal exudate.  Nares are injected and congested  bilat maxillary sinus tenderness Tms are dull  Clear post drainage  Eyes: Conjunctivae and EOM are normal.  Pupils are equal, round, and reactive to light. Right eye exhibits no discharge. Left eye exhibits no discharge. No scleral icterus.  Neck: Normal range of motion. Neck supple.  Cardiovascular: Normal rate, regular rhythm and intact distal pulses.   Pulmonary/Chest: Breath sounds normal. No respiratory distress. She has no wheezes. She has no rales.  Abdominal: Soft. Bowel sounds are normal.  Musculoskeletal: She exhibits no edema.  Lymphadenopathy:    She has no cervical adenopathy.  Neurological: She is alert. She has normal reflexes. No cranial nerve deficit.  Skin: Skin is warm and dry. No rash noted.  Psychiatric: She has a normal mood and affect.          Assessment & Plan:

## 2013-09-26 NOTE — Progress Notes (Signed)
Pre visit review using our clinic review tool, if applicable. No additional management support is needed unless otherwise documented below in the visit note. 

## 2013-09-27 DIAGNOSIS — J111 Influenza due to unidentified influenza virus with other respiratory manifestations: Secondary | ICD-10-CM | POA: Insufficient documentation

## 2013-09-27 NOTE — Assessment & Plan Note (Signed)
With symptoms for 3 weeks-this likely brought on sinus infection  Out of window for tamiflu  Disc symptomatic care - see instructions on AVS

## 2013-09-27 NOTE — Assessment & Plan Note (Signed)
Cover with augmentin  Disc symptomatic care - see instructions on AVS  Update if not starting to improve in a week or if worsening   

## 2014-01-08 ENCOUNTER — Other Ambulatory Visit: Payer: Self-pay

## 2014-01-08 DIAGNOSIS — Z1231 Encounter for screening mammogram for malignant neoplasm of breast: Secondary | ICD-10-CM

## 2014-02-16 ENCOUNTER — Other Ambulatory Visit: Payer: Self-pay | Admitting: Family Medicine

## 2014-02-16 NOTE — Telephone Encounter (Signed)
Please refill for a year and put back on med list

## 2014-02-16 NOTE — Telephone Encounter (Signed)
Last office visit 09/26/2013.  Not on current medication list.  Ok to refill?

## 2014-02-16 NOTE — Telephone Encounter (Signed)
done

## 2014-02-19 ENCOUNTER — Ambulatory Visit

## 2014-03-27 ENCOUNTER — Ambulatory Visit
Admission: RE | Admit: 2014-03-27 | Discharge: 2014-03-27 | Disposition: A | Discharge status: Home / Self Care | Source: Ambulatory Visit

## 2014-03-27 DIAGNOSIS — Z1231 Encounter for screening mammogram for malignant neoplasm of breast: Secondary | ICD-10-CM

## 2014-03-28 ENCOUNTER — Encounter: Payer: Self-pay | Admitting: *Deleted

## 2014-07-24 ENCOUNTER — Other Ambulatory Visit: Payer: Self-pay | Admitting: *Deleted

## 2014-07-24 MED ORDER — FLUTICASONE PROPIONATE 50 MCG/ACT NA SUSP: 2.0000 | NASAL | Status: DC | PRN

## 2015-01-03 ENCOUNTER — Ambulatory Visit (INDEPENDENT_AMBULATORY_CARE_PROVIDER_SITE_OTHER): Admitting: Family Medicine

## 2015-01-03 ENCOUNTER — Encounter: Payer: Self-pay | Admitting: Family Medicine

## 2015-01-03 VITALS — BP 120/74 | HR 87 | Temp 98.2°F | Wt 192.5 lb

## 2015-01-03 DIAGNOSIS — N814 Uterovaginal prolapse, unspecified: Secondary | ICD-10-CM

## 2015-01-03 DIAGNOSIS — N10 Acute tubulo-interstitial nephritis: Secondary | ICD-10-CM | POA: Diagnosis not present

## 2015-01-03 DIAGNOSIS — R350 Frequency of micturition: Secondary | ICD-10-CM

## 2015-01-03 DIAGNOSIS — N390 Urinary tract infection, site not specified: Secondary | ICD-10-CM | POA: Insufficient documentation

## 2015-01-03 LAB — POCT URINALYSIS DIPSTICK
BILIRUBIN UA: NEGATIVE
Blood, UA: NEGATIVE
GLUCOSE UA: NEGATIVE
Ketones, UA: NEGATIVE
NITRITE UA: NEGATIVE
PROTEIN UA: NEGATIVE
Spec Grav, UA: >=1.03
UROBILINOGEN UA: 0.2
pH, UA: 6

## 2015-01-03 MED ORDER — CIPROFLOXACIN HCL 500 MG PO TABS: 500.0000 mg | ORAL_TABLET | Freq: Two times a day (BID) | ORAL | Status: DC

## 2015-01-03 NOTE — Patient Instructions (Signed)

## 2015-01-03 NOTE — Progress Notes (Signed)
Pre visit review using our clinic review tool, if applicable. No additional management support is needed unless otherwise documented below in the visit note. 

## 2015-01-03 NOTE — Progress Notes (Signed)
SUBJECTIVE: Ashley Rasmussen is a 64 y.o. female who complains of urinary frequency, urgency and dysuria x 14 days, without flank pain, fever, chills, or abnormal vaginal discharge or bleeding.   Current Outpatient Prescriptions on File Prior to Visit  Medication Sig Dispense Refill  . calcitonin, salmon, (MIACALCIN/FORTICAL) 200 UNIT/ACT nasal spray USE ONE (1) SPRAY IN ALTERNATING NOSTRILS DAILY 7.4 mL 11  . fluticasone (FLONASE) 50 MCG/ACT nasal spray Place 2 sprays into both nostrils as needed. 16 g 1  . Multiple Vitamin (MULTIVITAMIN) tablet Take 3 tablets by mouth daily.     . naproxen sodium (ALEVE) 220 MG tablet Take 440 mg by mouth as needed.      No current facility-administered medications on file prior to visit.    Allergies  Allergen Reactions  . Alendronate Sodium     REACTION: GI side effects  . Pravachol     myalgias  . Raloxifene     REACTION: breast tenderness and leg pain  . Simvastatin     REACTION: myalgias    Past Medical History  Diagnosis Date  . Allergic rhinitis, cause unspecified   . Personal history of malignant neoplasm of breast     needs yearly mammogram and MRI every 2  . Osteopenia   . HLD (hyperlipidemia)   . Osteoporosis, unspecified   . Thoracic or lumbosacral neuritis or radiculitis, unspecified     Past Surgical History  Procedure Laterality Date  . Breast cancer excision    . Latissimus flap to breast      due to radiation burn    Family History  Problem Relation Age of Onset  . Other Mother     B-12 deficiency  . Breast cancer Mother   . Melanoma Mother   . Myelodysplastic syndrome Mother   . Osteoporosis Mother   . Other Sister     B-12 deficiency  . Breast cancer Sister   . Osteoporosis Sister   . Breast cancer      Multiple cousins    History   Social History  . Marital Status: Married    Spouse Name: N/A  . Number of Children: N/A  . Years of Education: N/A   Occupational History  . Not on file.   Social  History Main Topics  . Smoking status: Former Research scientist (life sciences)  . Smokeless tobacco: Not on file     Comment: quit over 35yrs ago  . Alcohol Use: No  . Drug Use: No  . Sexual Activity: Not on file   Other Topics Concern  . Not on file   Social History Narrative   Married      4 children         The PMH, PSH, Social History, Family History, Medications, and allergies have been reviewed in [facility], and have been updated if relevant.  OBJECTIVE: BP 120/74 mmHg  Pulse 87  Temp(Src) 98.2 F (36.8 C) (Oral)  Wt 192 lb 8 oz (87.317 kg)  SpO2 98%   Appears well, in no apparent distress.  Vital signs are normal. The abdomen is soft without tenderness, guarding, mass, rebound or organomegaly. No CVA tenderness or inguinal adenopathy noted. Urine dipstick shows positive for WBC's.    ASSESSMENT: UTI uncomplicated without evidence of pyelonephritis  PLAN: Treatment per orders - cipro 500 mg twice daily x 3 days, also push fluids, may use Pyridium OTC prn. Call or return to clinic prn if these symptoms worsen or fail to improve as anticipated.

## 2015-01-06 LAB — URINE CULTURE: Colony Count: >=100000

## 2015-01-14 ENCOUNTER — Ambulatory Visit (INDEPENDENT_AMBULATORY_CARE_PROVIDER_SITE_OTHER): Admitting: Obstetrics & Gynecology

## 2015-01-14 ENCOUNTER — Encounter: Payer: Self-pay | Admitting: Obstetrics & Gynecology

## 2015-01-14 ENCOUNTER — Encounter: Admitting: Obstetrics & Gynecology

## 2015-01-14 VITALS — BP 142/82 | HR 82 | Ht 66.0 in | Wt 196.0 lb

## 2015-01-14 DIAGNOSIS — R300 Dysuria: Secondary | ICD-10-CM

## 2015-01-14 DIAGNOSIS — N8110 Cystocele, unspecified: Secondary | ICD-10-CM | POA: Diagnosis not present

## 2015-01-14 DIAGNOSIS — IMO0002 Reserved for concepts with insufficient information to code with codable children: Secondary | ICD-10-CM

## 2015-01-14 DIAGNOSIS — N814 Uterovaginal prolapse, unspecified: Secondary | ICD-10-CM

## 2015-01-14 DIAGNOSIS — N816 Rectocele: Secondary | ICD-10-CM

## 2015-01-14 LAB — POCT URINALYSIS DIPSTICK
BILIRUBIN UA: NEGATIVE
Glucose, UA: NEGATIVE
Ketones, UA: NEGATIVE
Leukocytes, UA: NEGATIVE
NITRITE UA: NEGATIVE
PH UA: 5
Protein, UA: NEGATIVE
RBC UA: NEGATIVE
Spec Grav, UA: 1.015
UROBILINOGEN UA: NEGATIVE

## 2015-01-14 NOTE — Progress Notes (Signed)
   Subjective:    Patient ID: Ashley Rasmussen, female    DOB: [birth year], 64 y.o.   MRN: [id number]  HPI  This 65 yo MW P4 is here for evaluation of her "prolapse". She was seen by her primary care MD and offered a referral for this asymptomatic situation. She recently had a UTI and had some pelvic pain then but that has resolved with antibiotics. She is sexually active and has atrophy but says this does not decrease her sex life. She has a h/o breast cancer and she would not consider any vaginal estrogen.  Review of Systems Pap normal 2014    Objective:   Physical Exam  [id number] Breathing and ambulating normally Abd- benign EG- moderate atrophy 1st degree rectocele, uterine prolapse, and cystocele Bimanual exam reveals no masses or tenderness      Assessment & Plan:  Asymptomatic mild prolapse- After hearing the risks of surgery, she does not want any surgery at this time.

## 2015-02-12 ENCOUNTER — Other Ambulatory Visit: Payer: Self-pay

## 2015-02-12 DIAGNOSIS — Z1231 Encounter for screening mammogram for malignant neoplasm of breast: Secondary | ICD-10-CM

## 2015-02-20 ENCOUNTER — Other Ambulatory Visit: Payer: Self-pay | Admitting: Family Medicine

## 2015-03-21 ENCOUNTER — Encounter: Payer: Self-pay | Admitting: Family Medicine

## 2015-03-21 ENCOUNTER — Ambulatory Visit (INDEPENDENT_AMBULATORY_CARE_PROVIDER_SITE_OTHER): Admitting: Family Medicine

## 2015-03-21 VITALS — BP 120/80 | HR 108 | Temp 98.7°F | Ht 66.0 in | Wt 189.0 lb

## 2015-03-21 DIAGNOSIS — J069 Acute upper respiratory infection, unspecified: Secondary | ICD-10-CM | POA: Insufficient documentation

## 2015-03-21 DIAGNOSIS — R0789 Other chest pain: Secondary | ICD-10-CM | POA: Diagnosis not present

## 2015-03-21 MED ORDER — DICLOFENAC SODIUM 75 MG PO TBEC: 75.0000 mg | DELAYED_RELEASE_TABLET | Freq: Two times a day (BID) | ORAL | Status: DC | PRN

## 2015-03-21 NOTE — Progress Notes (Signed)
Pre visit review using our clinic review tool, if applicable. No additional management support is needed unless otherwise documented below in the visit note. 

## 2015-03-21 NOTE — Assessment & Plan Note (Signed)
Likely due to coughing . Treat with NSAIDs, heat and genlte stretching.

## 2015-03-21 NOTE — Assessment & Plan Note (Signed)
Resolving

## 2015-03-21 NOTE — Progress Notes (Signed)
   Subjective:    Patient ID: Ashley Rasmussen, female    DOB: Nov 26, 1950, 64 y.o.   MRN: [id number]  Cough This is a new problem. The current episode started 1 to 4 weeks ago ( 2 weeks, cold symptoms feels better, but new ches tpain this AM.). The problem has been gradually worsening. The cough is non-productive. Associated symptoms include chest pain, chills, ear congestion, a fever, myalgias and nasal congestion. Pertinent negatives include no ear pain, headaches, sore throat, shortness of breath or wheezing. Associated symptoms comments: Subjective fever  . The symptoms are aggravated by lying down. Treatments tried: aleve helps with chest wall pain. There is no history of asthma, bronchiectasis, bronchitis, COPD or environmental allergies.    Pain with deep breaths.   Former remote smoker, small amount.   Daughter with viral infection few weeks ago, costrochondritis.  Review of Systems  Constitutional: Positive for fever and chills.  HENT: Negative for ear pain and sore throat.   Respiratory: Positive for cough. Negative for shortness of breath and wheezing.   Cardiovascular: Positive for chest pain.  Musculoskeletal: Positive for myalgias.  Allergic/Immunologic: Negative for environmental allergies.  Neurological: Negative for headaches.       Objective:   Physical Exam  Constitutional: Vital signs are normal. She appears well-developed and well-nourished. She is cooperative.  Non-toxic appearance. She does not appear ill. No distress.  HENT:  Head: Normocephalic.  Right Ear: Hearing, tympanic membrane, external ear and ear canal normal. Tympanic membrane is not erythematous, not retracted and not bulging.  Left Ear: Hearing, tympanic membrane, external ear and ear canal normal. Tympanic membrane is not erythematous, not retracted and not bulging.  Nose: No mucosal edema or rhinorrhea. Right sinus exhibits no maxillary sinus tenderness and no frontal sinus tenderness. Left sinus  exhibits no maxillary sinus tenderness and no frontal sinus tenderness.  Mouth/Throat: Uvula is midline, oropharynx is clear and moist and mucous membranes are normal.  Eyes: Conjunctivae, EOM and lids are normal. Pupils are equal, round, and reactive to light. Lids are everted and swept, no foreign bodies found.  Neck: Trachea normal and normal range of motion. Neck supple. Carotid bruit is not present. No thyroid mass and no thyromegaly present.  Cardiovascular: Normal rate, regular rhythm, S1 normal, S2 normal, normal heart sounds, intact distal pulses and normal pulses.  Exam reveals no gallop and no friction rub.   No murmur heard. Pulmonary/Chest: Effort normal and breath sounds normal. No tachypnea. No respiratory distress. She has no decreased breath sounds. She has no wheezes. She has no rhonchi. She has no rales.    Abdominal: Soft. Normal appearance and bowel sounds are normal. There is no tenderness.  Musculoskeletal:       Thoracic back: She exhibits tenderness. She exhibits normal range of motion and no bony tenderness.       Back:  Neurological: She is alert.  Skin: Skin is warm, dry and intact. No rash noted.  Psychiatric: Her speech is normal and behavior is normal. Judgment and thought content normal. Her mood appears not anxious. Cognition and memory are normal. She does not exhibit a depressed mood.          Assessment & Plan:

## 2015-03-21 NOTE — Patient Instructions (Signed)
Treat with diclofenac twice daily as needed for pain and inflammation, heat and start  gentle stretching.  Call if not improving in 2 week, call sooner if shortness of breath or fever.

## 2015-03-28 HISTORY — PX: BREAST BIOPSY: SHX20

## 2015-03-29 ENCOUNTER — Ambulatory Visit
Admission: RE | Admit: 2015-03-29 | Discharge: 2015-03-29 | Disposition: A | Discharge status: Home / Self Care | Source: Ambulatory Visit

## 2015-03-29 DIAGNOSIS — Z1231 Encounter for screening mammogram for malignant neoplasm of breast: Secondary | ICD-10-CM

## 2015-04-03 ENCOUNTER — Other Ambulatory Visit: Payer: Self-pay | Admitting: Family Medicine

## 2015-04-03 DIAGNOSIS — R928 Other abnormal and inconclusive findings on diagnostic imaging of breast: Secondary | ICD-10-CM

## 2015-04-04 ENCOUNTER — Other Ambulatory Visit: Payer: Self-pay | Admitting: Family Medicine

## 2015-04-04 ENCOUNTER — Ambulatory Visit
Admission: RE | Admit: 2015-04-04 | Discharge: 2015-04-04 | Disposition: A | Discharge status: Home / Self Care | Source: Ambulatory Visit | Attending: Family Medicine | Admitting: Family Medicine

## 2015-04-04 DIAGNOSIS — R928 Other abnormal and inconclusive findings on diagnostic imaging of breast: Secondary | ICD-10-CM

## 2015-04-04 DIAGNOSIS — N6320 Unspecified lump in the left breast, unspecified quadrant: Secondary | ICD-10-CM

## 2015-04-10 ENCOUNTER — Other Ambulatory Visit: Payer: Self-pay | Admitting: Family Medicine

## 2015-04-10 ENCOUNTER — Ambulatory Visit
Admission: RE | Admit: 2015-04-10 | Discharge: 2015-04-10 | Disposition: A | Discharge status: Home / Self Care | Source: Ambulatory Visit | Attending: Family Medicine | Admitting: Family Medicine

## 2015-04-10 DIAGNOSIS — N6320 Unspecified lump in the left breast, unspecified quadrant: Secondary | ICD-10-CM

## 2015-05-08 ENCOUNTER — Ambulatory Visit (INDEPENDENT_AMBULATORY_CARE_PROVIDER_SITE_OTHER): Admitting: Family Medicine

## 2015-05-08 ENCOUNTER — Encounter: Payer: Self-pay | Admitting: Family Medicine

## 2015-05-08 VITALS — BP 130/82 | HR 82 | Temp 97.9°F | Ht 66.0 in | Wt 188.0 lb

## 2015-05-08 DIAGNOSIS — K219 Gastro-esophageal reflux disease without esophagitis: Secondary | ICD-10-CM | POA: Insufficient documentation

## 2015-05-08 MED ORDER — RANITIDINE HCL 150 MG PO CAPS: 150.0000 mg | ORAL_CAPSULE | Freq: Two times a day (BID) | ORAL | Status: DC

## 2015-05-08 NOTE — Patient Instructions (Signed)
I think you have acid reflux problems  Do watch diet (see the handout) and keep working on weight loss  Take the ranitidine (that is generic zoloft) 150 mg twice daily Stop aleve and avoid any over the counter pain medicines except tylenol (acetaminophen)  Update if not starting to improve in 1-2 weeks or if worsening   Take this for about 8 weeks and then start cutting back to 1 pill daily for 2 weeks and then stop it if you can - if symptoms return, get back on it

## 2015-05-08 NOTE — Assessment & Plan Note (Signed)
Adv to stop nsaid (aleve) Diet for gerd handout given Working on wt loss Ranitidine 150 mg bid - (update if not eff) 2 mo and then can try to wean Update if any problems

## 2015-05-08 NOTE — Progress Notes (Signed)
Pre visit review using our clinic review tool, if applicable. No additional management support is needed unless otherwise documented below in the visit note. 

## 2015-05-08 NOTE — Progress Notes (Signed)
Subjective:    Patient ID: Ashley Rasmussen, female    DOB: 22-Aug-1950, 64 y.o.   MRN: [id number]  HPI  For the past few weeks - has acid reflux symptoms   Is burning sensation in chest when or after eating (anything but water)  She did stop sodas  Also some pain under ribs (points to epigastric area)  occ heartburn at night in bed  Does not feel like food gets stuck   Took tums -helps some  No other otc meds   GERD runs in the family   She takes aleve- every day mainly at night 1-2   Of note: also had n/v/d on Monday- and it was going around family (better know)   Stress- abn mammogram / had neg bx   Patient Active Problem List   Diagnosis Date Noted  . Chest wall pain 03/21/2015  . Viral URI 03/21/2015  . UTI (urinary tract infection) 01/03/2015  . Influenza 09/27/2013  . Acute sinusitis 09/26/2013  . ETD (eustachian tube dysfunction) 08/21/2013  . Obesity 12/13/2012  . Routine gynecological examination 08/16/2012  . Colon cancer screening 08/16/2012  . Fasting hyperglycemia 08/16/2012  . Routine general medical examination at a health care facility 08/08/2012  . Uterine prolapse 07/06/2012  . BACK PAIN, LUMBAR, WITH RADICULOPATHY 08/15/2010  . HYPERLIPIDEMIA 04/13/2008  . OSTEOPOROSIS 04/06/2008  . ALLERGIC RHINITIS 04/15/2007  . BREAST CANCER, HX OF 04/15/2007   Past Medical History  Diagnosis Date  . Allergic rhinitis, cause unspecified   . Personal history of malignant neoplasm of breast     needs yearly mammogram and MRI every 2  . Osteopenia   . HLD (hyperlipidemia)   . Osteoporosis, unspecified   . Thoracic or lumbosacral neuritis or radiculitis, unspecified    Past Surgical History  Procedure Laterality Date  . Breast cancer excision    . Latissimus flap to breast      due to radiation burn   Social History  Substance Use Topics  . Smoking status: Former Research scientist (life sciences)  . Smokeless tobacco: Never Used     Comment: quit over 55yrs ago  . Alcohol Use:  No   Family History  Problem Relation Age of Onset  . Other Mother     B-12 deficiency  . Breast cancer Mother   . Melanoma Mother   . Myelodysplastic syndrome Mother   . Osteoporosis Mother   . Other Sister     B-12 deficiency  . Breast cancer Sister   . Osteoporosis Sister   . Breast cancer      Multiple cousins   Allergies  Allergen Reactions  . Alendronate Sodium     REACTION: GI side effects  . Pravachol     myalgias  . Raloxifene     REACTION: breast tenderness and leg pain  . Simvastatin     REACTION: myalgias   Current Outpatient Prescriptions on File Prior to Visit  Medication Sig Dispense Refill  . calcitonin, salmon, (MIACALCIN/FORTICAL) 200 UNIT/ACT nasal spray USE 1 SPRAY IN ALTERNATING NOSTRILS DAILY 3.7 mL 4  . Multiple Vitamin (MULTIVITAMIN) tablet Take 3 tablets by mouth daily.     . naproxen sodium (ALEVE) 220 MG tablet Take 440 mg by mouth as needed.      No current facility-administered medications on file prior to visit.     Review of Systems Review of Systems  Constitutional: Negative for fever, appetite change, fatigue and unexpected weight change.  Eyes: Negative for pain and visual disturbance.  Respiratory: Negative for cough and shortness of breath.   Cardiovascular: Negative for cp or palpitations    Gastrointestinal: Negative for nausea, diarrhea and constipation. pos for heartburn and occ upper abd pain (burning) Genitourinary: Negative for urgency and frequency.  Skin: Negative for pallor or rash   Neurological: Negative for weakness, light-headedness, numbness and headaches.  Hematological: Negative for adenopathy. Does not bruise/bleed easily.  Psychiatric/Behavioral: Negative for dysphoric mood. The patient is not nervous/anxious.  (pos for recent stress of breast bx which is now better)       Objective:   Physical Exam  Constitutional: She appears well-developed and well-nourished. No distress.  overwt and well appearing     HENT:  Head: Normocephalic and atraumatic.  Mouth/Throat: Oropharynx is clear and moist.  Eyes: Conjunctivae and EOM are normal. Pupils are equal, round, and reactive to light. No scleral icterus.  Neck: Normal range of motion. Neck supple.  Cardiovascular: Normal rate, regular rhythm and normal heart sounds.   Pulmonary/Chest: Effort normal and breath sounds normal. No respiratory distress. She has no wheezes. She has no rales.  Abdominal: Soft. Bowel sounds are normal. She exhibits no distension and no mass. There is no hepatosplenomegaly. There is tenderness in the epigastric area. There is no rebound, no guarding, no CVA tenderness, no tenderness at McBurney's point and negative Murphy's sign.  Mild epigastric discomfort to deep palp only  Lymphadenopathy:    She has no cervical adenopathy.  Neurological: She is alert.  Skin: Skin is warm and dry. No erythema. No pallor.  Psychiatric: She has a normal mood and affect.          Assessment & Plan:   Problem List Items Addressed This Visit      Digestive   GERD (gastroesophageal reflux disease) - Primary    Adv to stop nsaid (aleve) Diet for gerd handout given Working on wt loss Ranitidine 150 mg bid - (update if not eff) 2 mo and then can try to wean Update if any problems       Relevant Medications   ranitidine (ZANTAC) 150 MG capsule

## 2015-07-08 ENCOUNTER — Telehealth: Payer: Self-pay | Admitting: Family Medicine

## 2015-07-08 DIAGNOSIS — E785 Hyperlipidemia, unspecified: Secondary | ICD-10-CM

## 2015-07-08 DIAGNOSIS — R7301 Impaired fasting glucose: Secondary | ICD-10-CM

## 2015-07-08 NOTE — Telephone Encounter (Signed)
-----   Message from Ellamae Sia sent at 07/03/2015  3:49 PM EST ----- Regarding: Lab orders for Tuesday, 12.13.16 Patient is scheduled for CPX labs, please order future labs, Thanks , Karna Christmas

## 2015-07-10 ENCOUNTER — Other Ambulatory Visit

## 2015-07-17 ENCOUNTER — Encounter: Admitting: Family Medicine

## 2015-08-20 ENCOUNTER — Telehealth: Payer: Self-pay

## 2015-08-20 NOTE — Telephone Encounter (Signed)
prevnar please Thanks  In a year then she can have the Pneumovax 23

## 2015-08-20 NOTE — Telephone Encounter (Signed)
Pt received prerecorded call about pt needing pneumonia vaccine. Pt said she has never had pneumonia or the vaccine. Do you want pt to have pneumovax or prevnar 13. Please advise.

## 2015-08-20 NOTE — Telephone Encounter (Signed)
Nurse visit for prevnar scheduled

## 2015-08-22 ENCOUNTER — Ambulatory Visit: Payer: TRICARE For Life (TFL)

## 2015-09-02 ENCOUNTER — Other Ambulatory Visit: Payer: Self-pay | Admitting: Family Medicine

## 2015-09-02 DIAGNOSIS — N6320 Unspecified lump in the left breast, unspecified quadrant: Secondary | ICD-10-CM

## 2015-09-26 ENCOUNTER — Telehealth: Payer: Self-pay

## 2015-09-26 ENCOUNTER — Ambulatory Visit (INDEPENDENT_AMBULATORY_CARE_PROVIDER_SITE_OTHER): Payer: PPO

## 2015-09-26 VITALS — BP 120/82 | HR 85 | Temp 97.9°F | Ht 66.0 in | Wt 185.8 lb

## 2015-09-26 DIAGNOSIS — Z1159 Encounter for screening for other viral diseases: Secondary | ICD-10-CM

## 2015-09-26 DIAGNOSIS — H579 Unspecified disorder of eye and adnexa: Secondary | ICD-10-CM | POA: Diagnosis not present

## 2015-09-26 DIAGNOSIS — Z114 Encounter for screening for human immunodeficiency virus [HIV]: Secondary | ICD-10-CM

## 2015-09-26 DIAGNOSIS — Z0000 Encounter for general adult medical examination without abnormal findings: Secondary | ICD-10-CM

## 2015-09-26 DIAGNOSIS — Z1211 Encounter for screening for malignant neoplasm of colon: Secondary | ICD-10-CM | POA: Diagnosis not present

## 2015-09-26 DIAGNOSIS — M25579 Pain in unspecified ankle and joints of unspecified foot: Secondary | ICD-10-CM

## 2015-09-26 DIAGNOSIS — H9193 Unspecified hearing loss, bilateral: Secondary | ICD-10-CM | POA: Diagnosis not present

## 2015-09-26 DIAGNOSIS — E669 Obesity, unspecified: Secondary | ICD-10-CM

## 2015-09-26 DIAGNOSIS — E785 Hyperlipidemia, unspecified: Secondary | ICD-10-CM

## 2015-09-26 DIAGNOSIS — Z23 Encounter for immunization: Secondary | ICD-10-CM | POA: Diagnosis not present

## 2015-09-26 NOTE — Patient Instructions (Signed)
Ashley Rasmussen , Thank you for taking time to come for your Medicare Wellness Visit. I appreciate your ongoing commitment to your health goals. Please review the following plan we discussed and let me know if I can assist you in the future.   These are the goals we discussed: Goals    . Exercise 3x per week (30 min per time)     When weather permits, I will walk 30 min for at least 5 days per week.        This is a list of the screening recommended for you and due dates:  Health Maintenance  Topic Date Due  .  Hepatitis C: One time screening is recommended by Center for Disease Control  (CDC) for  adults born from [birth year] through 1966.   completed  . HIV Screening  completed  . Colon Cancer Screening  schedule  . Shingles Vaccine  Postpone - insurance verification  . Flu Shot  completed  . Pneumonia vaccines (1 of 2 - PCV13) completed  . Tetanus Vaccine  Postpone - insurance verification  . Pap Smear  Will complete at CPE  . Mammogram  04/09/2016  . DEXA scan (bone density measurement)  Completed   Preventive Care for Adults  A healthy lifestyle and preventive care can promote health and wellness. Preventive health guidelines for women include the following key practices.  . A routine yearly physical is a good way to check with your health care provider about your health and preventive screening. It is a chance to share any concerns and updates on your health and to receive a thorough exam.  . Visit your dentist for a routine exam and preventive care every 6 months. Brush your teeth twice a day and floss once a day. Good oral hygiene prevents tooth decay and gum disease.  . The frequency of eye exams is based on your age, health, family medical history, use  of contact lenses, and other factors. Follow your health care provider's ecommendations for frequency of eye exams.  . Eat a healthy diet. Foods like vegetables, fruits, whole grains, low-fat dairy products, and lean protein foods  contain the nutrients you need without too many calories. Decrease your intake of foods high in solid fats, added sugars, and salt. Eat the right amount of calories for you. Get information about a proper diet from your health care provider, if necessary.  . Regular physical exercise is one of the most important things you can do for your health. Most adults should get at least 150 minutes of moderate-intensity exercise (any activity that increases your heart rate and causes you to sweat) each week. In addition, most adults need muscle-strengthening exercises on 2 or more days a week.  . Maintain a healthy weight. The body mass index (BMI) is a screening tool to identify possible weight problems. It provides an estimate of body fat based on height and weight. Your health care provider can find your BMI and can help you achieve or maintain a healthy weight.  For adults 20 years and older: ? A BMI below 18.5 is considered underweight. ? A BMI of 18.5 to 24.9 is normal. ? A BMI of 25 to 29.9 is considered overweight. ? A BMI of 30 and above is considered obese.   . Maintain normal blood lipids and cholesterol levels by exercising and minimizing your intake of saturated fat. Eat a balanced diet with plenty of fruit and vegetables. Blood tests for lipids and cholesterol should begin at  age 33 and be repeated every 5 years. If your lipid or cholesterol levels are high, you are over 50, or you are at high risk for heart disease, you may need your cholesterol levels checked more frequently. Ongoing high lipid and cholesterol levels should be treated with medicines if diet and exercise are not working.  . If you smoke, find out from your health care provider how to quit. If you do not use tobacco, please do not start.  . If you choose to drink alcohol, please do not consume more than 2 drinks per day. One drink is considered to be 12 ounces (355 mL) of beer, 5 ounces (148 mL) of wine, or 1.5 ounces (44 mL) of  liquor.  . If you are 89-66 years old, ask your health care provider if you should take aspirin to prevent strokes.  . Osteoporosis is a disease in which the bones lose minerals and strength with aging. This can result in serious bone fractures or breaks. The risk of osteoporosis can be identified using a bone density scan. Women ages 61 years and over and women at risk for fractures or osteoporosis should discuss screening with their health care providers. Ask your health care provider whether you should take a calcium supplement or vitamin D to reduce the rate of osteoporosis.  . Menopause can be associated with physical symptoms and risks. Hormone replacement therapy is available to decrease symptoms and risks. You should talk to your health care provider about whether hormone replacement therapy is right for you.  . Use sunscreen. Apply sunscreen liberally and repeatedly throughout the day. You should seek shade when your shadow is shorter than you. Protect yourself by wearing long sleeves, pants, a wide-brimmed hat, and sunglasses year round, whenever you are outdoors.  . Once a month, do a whole body skin exam, using a mirror to look at the skin on your back. Tell your health care provider of new moles, moles that have irregular borders, moles that are larger than a pencil eraser, or moles that have changed in shape or color.

## 2015-09-26 NOTE — Progress Notes (Signed)
   Subjective:    Patient ID: Ashley Rasmussen, female    DOB: 03/04/1951, 66 y.o.   MRN: [id number]  HPI    Review of Systems     Objective:   Physical Exam        Assessment & Plan:  I reviewed health advisor's note, was available for consultation, and agree with documentation and plan.

## 2015-09-26 NOTE — Progress Notes (Signed)
Subjective:   Ashley Rasmussen is a 65 y.o. female who presents for Medicare Annual Wellness preventive examination.   Cardiac Risk Factors include: advanced age (>79men, >24 women);dyslipidemia     Objective:     Vitals: BP 120/82 mmHg  Pulse 85  Temp(Src) 97.9 F (36.6 C) (Oral)  Ht 5\' 6"  (1.676 m)  Wt 185 lb 12 oz (84.256 kg)  BMI 30.00 kg/m2  SpO2 97%  Tobacco History  Smoking status  . Former Smoker  Smokeless tobacco  . Never Used    Comment: quit over 66yrs ago     Counseling given: No   Past Medical History  Diagnosis Date  . Allergic rhinitis, cause unspecified   . Personal history of malignant neoplasm of breast     needs yearly mammogram and MRI every 2  . Osteopenia   . HLD (hyperlipidemia)   . Osteoporosis, unspecified   . Thoracic or lumbosacral neuritis or radiculitis, unspecified    Past Surgical History  Procedure Laterality Date  . Breast cancer excision    . Latissimus flap to breast      due to radiation burn   Family History  Problem Relation Age of Onset  . Other Mother     B-12 deficiency  . Breast cancer Mother   . Melanoma Mother   . Myelodysplastic syndrome Mother   . Osteoporosis Mother   . Other Sister     B-12 deficiency  . Breast cancer Sister   . Osteoporosis Sister   . Breast cancer      Multiple cousins   History  Sexual Activity  . Sexual Activity: No    Outpatient Encounter Prescriptions as of 09/26/2015  Medication Sig  . Multiple Vitamin (MULTIVITAMIN) tablet Take 3 tablets by mouth daily.   . ranitidine (ZANTAC) 150 MG capsule Take 1 capsule (150 mg total) by mouth 2 (two) times daily.  . calcitonin, salmon, (MIACALCIN/FORTICAL) 200 UNIT/ACT nasal spray USE 1 SPRAY IN ALTERNATING NOSTRILS DAILY (Patient not taking: Reported on 09/26/2015)  . Cetirizine HCl ([facility] INDOOR/OUTDOOR ALLERGY RLF PO) Take 1 capsule by mouth daily as needed. Reported on 09/26/2015  . naproxen sodium (ALEVE) 220 MG tablet Take 440 mg by  mouth as needed. Reported on 09/26/2015   No facility-administered encounter medications on file as of 09/26/2015.    Activities of Daily Living In your present state of health, do you have any difficulty performing the following activities: 09/26/2015  Hearing? Y  Vision? N  Difficulty concentrating or making decisions? N  Walking or climbing stairs? N  Dressing or bathing? N  Doing errands, shopping? N  Preparing Food and eating ? N  Using the Toilet? N  In the past six months, have you accidently leaked urine? N  Do you have problems with loss of bowel control? N  Managing your Medications? N  Managing your Finances? N  Housekeeping or managing your Housekeeping? N    Patient Care Team: Abner Greenspan, MD as PCP - General    Assessment:    Exercise Activities and Dietary recommendations Current Exercise Habits:: Home exercise routine, Type of exercise: walking, Time (Minutes): 30, Frequency (Times/Week): 2, Weekly Exercise (Minutes/Week): 60, Intensity: Mild  Goals    . Exercise 3x per week (30 min per time)     When weather permits, I will walk 30 min for at least 5 days per week.       Fall Risk Fall Risk  09/26/2015  Falls in the past  year? No   Depression Screen PHQ 2/9 Scores 09/26/2015  PHQ - 2 Score 0     Cognitive Testing MMSE - Mini Mental State Exam 09/26/2015  Orientation to time 5  Orientation to Place 5  Registration 3  Attention/ Calculation 5  Recall 3  Language- name 2 objects 0  Language- repeat 1  Language- follow 3 step command 3  Language- read & follow direction 1  Write a sentence 0  Copy design 0  Total score 26    Immunization History  Administered Date(s) Administered  . Influenza Split 06/21/2012  . Influenza Whole 07/13/2002  . Influenza,inj,Quad PF,36+ Mos 05/23/2013, 09/26/2015  . Pneumococcal Conjugate-13 09/26/2015  . Td 05/27/2005   Screening Tests Health Maintenance  Topic Date Due  . Hepatitis C Screening  Will complete  with CPE labs  . HIV Screening  Will complete with CPE labs  . INFLUENZA VACCINE  07/27/2016  . PNA vac Low Risk Adult (1 of 2 - PCV13) 09/25/2016  . PAP SMEAR  Will complete at CPE  . COLONOSCOPY  12/26/2015 (Originally 05/25/2000)  . ZOSTAVAX  06/26/2016 (Originally 05/25/2010)  . TETANUS/TDAP  06/26/2016 (Originally 05/28/2015)  . MAMMOGRAM  04/09/2016  . DEXA SCAN  Completed      Plan:     I have personally reviewed the Medicare Annual Wellness questionnaire and have noted the following in the patient's chart:  A. Medical and social history B. Use of alcohol, tobacco or illicit drugs  C. Current medications and supplements D. Functional ability and status E.  Nutritional status F.  Physical activity G. Advance directives H. List of other physicians I.  Hospitalizations, surgeries, and ER visits in previous 12 months J.  [place] to include hearing, vision, cognitive, depression L. Referrals, if needed  In addition, I reviewed preventive protocols, quality metrics, and best practice recommendations specific to patient. A written personalized care plan for preventive services as well as general preventive health recommendations was provided to patient.  See attached scanned questionnaire for additional information.   Signed,   Lindell Noe, MHA, BS, LPN Health Advisor [id number]

## 2015-09-26 NOTE — Telephone Encounter (Signed)
Scheduled CPE with Dr. Glori Bickers.

## 2015-09-26 NOTE — Progress Notes (Signed)
Pre visit review using our clinic review tool, if applicable. No additional management support is needed unless otherwise documented below in the visit note. 

## 2015-09-27 ENCOUNTER — Ambulatory Visit (INDEPENDENT_AMBULATORY_CARE_PROVIDER_SITE_OTHER): Payer: PPO | Admitting: Family Medicine

## 2015-09-27 ENCOUNTER — Ambulatory Visit (INDEPENDENT_AMBULATORY_CARE_PROVIDER_SITE_OTHER)
Admission: RE | Admit: 2015-09-27 | Discharge: 2015-09-27 | Disposition: A | Discharge status: Home / Self Care | Payer: PPO | Source: Ambulatory Visit | Attending: Family Medicine | Admitting: Family Medicine

## 2015-09-27 ENCOUNTER — Encounter: Payer: Self-pay | Admitting: Family Medicine

## 2015-09-27 ENCOUNTER — Other Ambulatory Visit (INDEPENDENT_AMBULATORY_CARE_PROVIDER_SITE_OTHER): Payer: PPO

## 2015-09-27 VITALS — BP 118/74 | HR 94 | Temp 98.0°F | Ht 66.0 in | Wt 185.0 lb

## 2015-09-27 DIAGNOSIS — M79672 Pain in left foot: Secondary | ICD-10-CM

## 2015-09-27 DIAGNOSIS — M7732 Calcaneal spur, left foot: Secondary | ICD-10-CM | POA: Diagnosis not present

## 2015-09-27 DIAGNOSIS — R7301 Impaired fasting glucose: Secondary | ICD-10-CM | POA: Diagnosis not present

## 2015-09-27 DIAGNOSIS — E785 Hyperlipidemia, unspecified: Secondary | ICD-10-CM | POA: Diagnosis not present

## 2015-09-27 LAB — LIPID PANEL
CHOL/HDL RATIO: 6
CHOLESTEROL: 252 mg/dL — AB (ref 0–200)
HDL: 41.6 mg/dL (ref >39.00)
NonHDL: 210.13
TRIGLYCERIDES: 237 mg/dL — AB (ref 0.0–149.0)
VLDL: 47.4 mg/dL — AB (ref 0.0–40.0)

## 2015-09-27 LAB — CBC WITH DIFFERENTIAL/PLATELET
BASOS PCT: 0.3 % (ref 0.0–3.0)
Basophils Absolute: 0 10*3/uL (ref 0.0–0.1)
EOS PCT: 1.5 % (ref 0.0–5.0)
Eosinophils Absolute: 0.1 10*3/uL (ref 0.0–0.7)
HEMATOCRIT: 42.7 % (ref 36.0–46.0)
HEMOGLOBIN: 14.1 g/dL (ref 12.0–15.0)
LYMPHS PCT: 24.4 % (ref 12.0–46.0)
Lymphs Abs: 1.8 10*3/uL (ref 0.7–4.0)
MCHC: 32.9 g/dL (ref 30.0–36.0)
MCV: 87.5 fl (ref 78.0–100.0)
MONOS PCT: 7.8 % (ref 3.0–12.0)
Monocytes Absolute: 0.6 10*3/uL (ref 0.1–1.0)
NEUTROS ABS: 5 10*3/uL (ref 1.4–7.7)
Neutrophils Relative %: 66 % (ref 43.0–77.0)
PLATELETS: 234 10*3/uL (ref 150.0–400.0)
RBC: 4.88 Mil/uL (ref 3.87–5.11)
RDW: 13.5 % (ref 11.5–15.5)
WBC: 7.6 10*3/uL (ref 4.0–10.5)

## 2015-09-27 LAB — COMPREHENSIVE METABOLIC PANEL
ALBUMIN: 4.4 g/dL (ref 3.5–5.2)
ALT: 17 U/L (ref 0–35)
AST: 16 U/L (ref 0–37)
Alkaline Phosphatase: 86 U/L (ref 39–117)
BILIRUBIN TOTAL: 0.6 mg/dL (ref 0.2–1.2)
BUN: 16 mg/dL (ref 6–23)
CALCIUM: 10.3 mg/dL (ref 8.4–10.5)
CHLORIDE: 107 meq/L (ref 96–112)
CO2: 27 meq/L (ref 19–32)
CREATININE: 0.91 mg/dL (ref 0.40–1.20)
GFR: 65.87 mL/min (ref >60.00)
Glucose, Bld: 100 mg/dL — ABNORMAL HIGH (ref 70–99)
Potassium: 3.9 mEq/L (ref 3.5–5.1)
Sodium: 142 mEq/L (ref 135–145)
Total Protein: 7.1 g/dL (ref 6.0–8.3)

## 2015-09-27 LAB — TSH: TSH: 1.4 u[IU]/mL (ref 0.35–4.50)

## 2015-09-27 LAB — LDL CHOLESTEROL, DIRECT: Direct LDL: 177 mg/dL

## 2015-09-27 LAB — HEMOGLOBIN A1C: HEMOGLOBIN A1C: 5.7 % (ref 4.6–6.5)

## 2015-09-27 MED ORDER — DICLOFENAC SODIUM 1 % TD GEL: 2.0000 g | Freq: Three times a day (TID) | TRANSDERMAL | Status: DC | PRN

## 2015-09-27 NOTE — Patient Instructions (Signed)
Xray of foot today  Try to wear more supportive shoes for walking Try the diclofenac gel three times per day as needed to the sore area  You can also use a cold compress and elevate foot when you are sitting   We will contact you with a result today and make a plan

## 2015-09-27 NOTE — Progress Notes (Signed)
Pre visit review using our clinic review tool, if applicable. No additional management support is needed unless otherwise documented below in the visit note. 

## 2015-09-27 NOTE — Progress Notes (Signed)
Subjective:    Patient ID: Ashley Rasmussen, female    DOB: 03-Sep-1950, 65 y.o.   MRN: [id number]  HPI Here for foot pain L   Injured it 3-4 mo ago - dropped a heavy plate on it   (top of the foot) Sore  Cut it mildly  Now it is sore to walk on it /put pressure on it  Can move toes  Some mild tenderness   It did swell- came down It bruised-that resolved (mild)  Cut/abrasion was mild- kept it clean and it healed well   No meds No ice or heat   ? OA in knees Fingers are a bit stiff   Patient Active Problem List   Diagnosis Date Noted  . GERD (gastroesophageal reflux disease) 05/08/2015  . Chest wall pain 03/21/2015  . Viral URI 03/21/2015  . UTI (urinary tract infection) 01/03/2015  . Influenza 09/27/2013  . Acute sinusitis 09/26/2013  . ETD (eustachian tube dysfunction) 08/21/2013  . Obesity 12/13/2012  . Routine gynecological examination 08/16/2012  . Colon cancer screening 08/16/2012  . Fasting hyperglycemia 08/16/2012  . Routine general medical examination at a health care facility 08/08/2012  . Uterine prolapse 07/06/2012  . BACK PAIN, LUMBAR, WITH RADICULOPATHY 08/15/2010  . Hyperlipidemia 04/13/2008  . OSTEOPOROSIS 04/06/2008  . ALLERGIC RHINITIS 04/15/2007  . BREAST CANCER, HX OF 04/15/2007   Past Medical History  Diagnosis Date  . Allergic rhinitis, cause unspecified   . Personal history of malignant neoplasm of breast     needs yearly mammogram and MRI every 2  . Osteopenia   . HLD (hyperlipidemia)   . Osteoporosis, unspecified   . Thoracic or lumbosacral neuritis or radiculitis, unspecified    Past Surgical History  Procedure Laterality Date  . Breast cancer excision    . Latissimus flap to breast      due to radiation burn   Social History  Substance Use Topics  . Smoking status: Former Research scientist (life sciences)  . Smokeless tobacco: Never Used     Comment: quit over 4yrs ago  . Alcohol Use: No   Family History  Problem Relation Age of Onset  . Other  Mother     B-12 deficiency  . Breast cancer Mother   . Melanoma Mother   . Myelodysplastic syndrome Mother   . Osteoporosis Mother   . Other Sister     B-12 deficiency  . Breast cancer Sister   . Osteoporosis Sister   . Breast cancer      Multiple cousins   Allergies  Allergen Reactions  . Alendronate Sodium     REACTION: GI side effects  . Pravachol     myalgias  . Raloxifene     REACTION: breast tenderness and leg pain  . Simvastatin     REACTION: myalgias   Current Outpatient Prescriptions on File Prior to Visit  Medication Sig Dispense Refill  . Multiple Vitamin (MULTIVITAMIN) tablet Take 3 tablets by mouth daily.     . ranitidine (ZANTAC) 150 MG capsule Take 1 capsule (150 mg total) by mouth 2 (two) times daily. 60 capsule 5   No current facility-administered medications on file prior to visit.     Review of Systems Review of Systems  Constitutional: Negative for fever, appetite change, fatigue and unexpected weight change.  Eyes: Negative for pain and visual disturbance.  Respiratory: Negative for cough and shortness of breath.   Cardiovascular: Negative for cp or palpitations    Gastrointestinal: Negative for nausea, diarrhea and  constipation.  Genitourinary: Negative for urgency and frequency.  Skin: Negative for pallor or rash   MSK pos for L foot pain without swelling or joint change  Neurological: Negative for weakness, light-headedness, numbness and headaches.  Hematological: Negative for adenopathy. Does not bruise/bleed easily.  Psychiatric/Behavioral: Negative for dysphoric mood. The patient is not nervous/anxious.         Objective:   Physical Exam  Constitutional: She appears well-developed and well-nourished.  HENT:  Head: Normocephalic and atraumatic.  Eyes: Conjunctivae and EOM are normal. Pupils are equal, round, and reactive to light.  Neck: Normal range of motion. Neck supple.  Cardiovascular: Normal rate and regular rhythm.     Musculoskeletal: She exhibits tenderness. She exhibits no edema.  L foot Tender over dorsal proximal 3-5th metatarsal bones No swelling/ecchymosis or crepitus noted Nl rom foot-more pain to dorsiflex than plantar flex Nl gait  Nl perf and innervation   Neurological: She is alert. She has normal reflexes. She displays no atrophy. No sensory deficit. She exhibits normal muscle tone. Gait normal.  Skin: Skin is warm and dry. No rash noted. No erythema. No pallor.  Psychiatric: She has a normal mood and affect.          Assessment & Plan:   Problem List Items Addressed This Visit      Other   Left foot pain - Primary    Since dropping a heavy plate on the top of her foot 3-4 mo ago Hurts to walk  Tender over 3-5th prox metatarsals (mild) no deformity xr now  Diclofenac gel prn (no oral nsaid due to gerd) Elevate/ice Supportive footwear  Further plan per rad rev      Relevant Orders   DG Foot Complete Left (Completed)

## 2015-09-27 NOTE — Assessment & Plan Note (Signed)
Since dropping a heavy plate on the top of her foot 3-4 mo ago Hurts to walk  Tender over 3-5th prox metatarsals (mild) no deformity xr now  Diclofenac gel prn (no oral nsaid due to gerd) Elevate/ice Supportive footwear  Further plan per rad rev

## 2015-09-28 LAB — RHEUMATOID FACTOR

## 2015-09-28 LAB — HEPATITIS C ANTIBODY: HCV AB: NEGATIVE

## 2015-09-28 LAB — HIV ANTIBODY (ROUTINE TESTING W REFLEX): HIV 1&2 Ab, 4th Generation: NONREACTIVE

## 2015-10-02 ENCOUNTER — Encounter: Payer: Self-pay | Admitting: Family Medicine

## 2015-10-02 ENCOUNTER — Ambulatory Visit (INDEPENDENT_AMBULATORY_CARE_PROVIDER_SITE_OTHER): Payer: PPO | Admitting: Family Medicine

## 2015-10-02 ENCOUNTER — Other Ambulatory Visit (HOSPITAL_COMMUNITY)
Admission: RE | Admit: 2015-10-02 | Discharge: 2015-10-02 | Disposition: A | Discharge status: Home / Self Care | Payer: PPO | Source: Ambulatory Visit | Attending: Family Medicine | Admitting: Family Medicine

## 2015-10-02 VITALS — BP 122/72 | HR 106 | Temp 98.6°F | Ht 66.0 in | Wt 185.8 lb

## 2015-10-02 DIAGNOSIS — M810 Age-related osteoporosis without current pathological fracture: Secondary | ICD-10-CM | POA: Diagnosis not present

## 2015-10-02 DIAGNOSIS — E669 Obesity, unspecified: Secondary | ICD-10-CM

## 2015-10-02 DIAGNOSIS — Z01419 Encounter for gynecological examination (general) (routine) without abnormal findings: Secondary | ICD-10-CM | POA: Diagnosis not present

## 2015-10-02 DIAGNOSIS — Z1151 Encounter for screening for human papillomavirus (HPV): Secondary | ICD-10-CM | POA: Insufficient documentation

## 2015-10-02 DIAGNOSIS — Z0000 Encounter for general adult medical examination without abnormal findings: Secondary | ICD-10-CM | POA: Diagnosis not present

## 2015-10-02 DIAGNOSIS — E785 Hyperlipidemia, unspecified: Secondary | ICD-10-CM

## 2015-10-02 DIAGNOSIS — R7301 Impaired fasting glucose: Secondary | ICD-10-CM

## 2015-10-02 DIAGNOSIS — H9192 Unspecified hearing loss, left ear: Secondary | ICD-10-CM

## 2015-10-02 DIAGNOSIS — Z853 Personal history of malignant neoplasm of breast: Secondary | ICD-10-CM | POA: Diagnosis not present

## 2015-10-02 DIAGNOSIS — Z1211 Encounter for screening for malignant neoplasm of colon: Secondary | ICD-10-CM

## 2015-10-02 DIAGNOSIS — N814 Uterovaginal prolapse, unspecified: Secondary | ICD-10-CM

## 2015-10-02 LAB — HM PAP SMEAR: HM Pap smear: NORMAL

## 2015-10-02 NOTE — Assessment & Plan Note (Signed)
S/p surgery and tamoxifen in the past  Has been cancer free Rev bx (neg) of other breast from fall  Has upcoming bilat dg mm and [place] on L planned on 3/10  Enc regular self exams  Very strong family hx Cannot afford genetic testing

## 2015-10-02 NOTE — Assessment & Plan Note (Signed)
Discussed how this problem influences overall health and the risks it imposes  Reviewed plan for weight loss with lower calorie diet (via better food choices and also portion control or program like weight watchers) and exercise building up to or more than 30 minutes 5 days per week including some aerobic activity   Commended on almost 10 lb wt loss so far with lifestyle change

## 2015-10-02 NOTE — Assessment & Plan Note (Signed)
Reviewed health habits including diet and exercise and skin cancer prevention Reviewed appropriate screening tests for age  Also reviewed health mt list, fam hx and immunization status , as well as social and family history   See HPI AMW visit reviewed  Labs reviewed Will consider dexa later after other tests  For cholesterol Avoid red meat/ fried foods/ egg yolks/ fatty breakfast meats/ butter, cheese and high fat dairy/ and shellfish   We will re check your cholesterol in 3 months  Other labs looked good Keep working on weight loss with exercise and good water intake and low sugar and fat diet  Pap done today  Get your colonoscopy and breast studies as planned Wear your sun protection

## 2015-10-02 NOTE — Progress Notes (Signed)
Subjective:    Patient ID: Ashley Rasmussen, female    DOB: 03-19-1951, 65 y.o.   MRN: [id number]  HPI Here for health maintenance exam and to review chronic medical problems    Reviewed her amw with Katha Cabal  Is scheduled for further eval of L ear /poor hering    Wt is down 3 lb from fall and 9 lb from summer bmi of 30 - obese range  She is decreasing fatty food/inc water and also walking   Pap nl 1/14 No gyn symptoms No vaginal bleeding  Has a little uterine prolapse -has seen gyn -is watching that  Last 3 paps are all neg    colonoscopy is planned next mo for colon cancer screening  She is glad she decided to do it   Declines zoster vaccine (not paid for) Declines tetanus vaccine (also not paid for)-cannot afford right now   Flu shot 3/17  Mm 9/16 with bx that was neg for malignancy Hx of breast cancer with hx of surgery and tamoxifen years ago  Has her f/u mm/us planned on 3/10   Pneumovax 2017 updated   Was screened for Hep C and HIV -both negative   Rheumatoid test negative (pt req this)   Lab Results  Component Value Date   WBC 7.6 09/27/2015   HGB 14.1 09/27/2015   HCT 42.7 09/27/2015   MCV 87.5 09/27/2015   PLT 234.0 09/27/2015      Chemistry      Component Value Date/Time   NA 142 09/27/2015 1225   K 3.9 09/27/2015 1225   CL 107 09/27/2015 1225   CO2 27 09/27/2015 1225   BUN 16 09/27/2015 1225   CREATININE 0.91 09/27/2015 1225      Component Value Date/Time   CALCIUM 10.3 09/27/2015 1225   ALKPHOS 86 09/27/2015 1225   AST 16 09/27/2015 1225   ALT 17 09/27/2015 1225   BILITOT 0.6 09/27/2015 1225      Blood glucose 100  Lab Results  Component Value Date   HGBA1C 5.7 09/27/2015   Lab Results  Component Value Date   TSH 1.40 09/27/2015      Cholesterol Lab Results  Component Value Date   CHOL 252* 09/27/2015   CHOL 206* 08/09/2012   CHOL 218* 03/06/2010   Lab Results  Component Value Date   HDL 41.60 09/27/2015   HDL 41.50  08/09/2012   HDL 46.70 03/06/2010   Lab Results  Component Value Date   LDLCALC 158* 04/06/2008   Lab Results  Component Value Date   TRIG 237.0* 09/27/2015   TRIG 131.0 08/09/2012   TRIG 121.0 03/06/2010   Lab Results  Component Value Date   CHOLHDL 6 09/27/2015   CHOLHDL 5 08/09/2012   CHOLHDL 5 03/06/2010   Lab Results  Component Value Date   LDLDIRECT 177.0 09/27/2015   LDLDIRECT 140.9 08/09/2012   LDLDIRECT 158.5 03/06/2010    She watches fat -but more cream in coffee lately  Likes high fat dairy  Cholesterol is going up with time  ? If family history  No vasc dz in family  ? If hereditary or diet    Patient Active Problem List   Diagnosis Date Noted  . Hearing loss in left ear 10/02/2015  . Left foot pain 09/27/2015  . GERD (gastroesophageal reflux disease) 05/08/2015  . Obesity 12/13/2012  . Encounter for routine gynecological examination 08/16/2012  . Colon cancer screening 08/16/2012  . Fasting hyperglycemia 08/16/2012  . Routine  general medical examination at a health care facility 08/08/2012  . Uterine prolapse 07/06/2012  . BACK PAIN, LUMBAR, WITH RADICULOPATHY 08/15/2010  . Hyperlipidemia 04/13/2008  . Osteoporosis 04/06/2008  . ALLERGIC RHINITIS 04/15/2007  . BREAST CANCER, HX OF 04/15/2007   Past Medical History  Diagnosis Date  . Allergic rhinitis, cause unspecified   . Personal history of malignant neoplasm of breast     needs yearly mammogram and MRI every 2  . Osteopenia   . HLD (hyperlipidemia)   . Osteoporosis, unspecified   . Thoracic or lumbosacral neuritis or radiculitis, unspecified    Past Surgical History  Procedure Laterality Date  . Breast cancer excision    . Latissimus flap to breast      due to radiation burn   Social History  Substance Use Topics  . Smoking status: Former Research scientist (life sciences)  . Smokeless tobacco: Never Used     Comment: quit over 31yrs ago  . Alcohol Use: No   Family History  Problem Relation Age of Onset    . Other Mother     B-12 deficiency  . Breast cancer Mother   . Melanoma Mother   . Myelodysplastic syndrome Mother   . Osteoporosis Mother   . Other Sister     B-12 deficiency  . Breast cancer Sister   . Osteoporosis Sister   . Breast cancer      Multiple cousins   Allergies  Allergen Reactions  . Alendronate Sodium     REACTION: GI side effects  . Pravachol     myalgias  . Raloxifene     REACTION: breast tenderness and leg pain  . Simvastatin     REACTION: myalgias   Current Outpatient Prescriptions on File Prior to Visit  Medication Sig Dispense Refill  . diclofenac sodium (VOLTAREN) 1 % GEL Apply 2 g topically 3 (three) times daily as needed (to affected area on foot). 100 g 1  . Multiple Vitamin (MULTIVITAMIN) tablet Take 3 tablets by mouth daily.     . ranitidine (ZANTAC) 150 MG capsule Take 1 capsule (150 mg total) by mouth 2 (two) times daily. 60 capsule 5   No current facility-administered medications on file prior to visit.    Review of Systems    Review of Systems  Constitutional: Negative for fever, appetite change, fatigue and unexpected weight change.  Eyes: Negative for pain and visual disturbance.  ENT pos for runny nose and pnd Respiratory: Negative for cough and shortness of breath.   Cardiovascular: Negative for cp or palpitations    Gastrointestinal: Negative for nausea, diarrhea and constipation.  Genitourinary: Negative for urgency and frequency.  Skin: Negative for pallor or rash   Neurological: Negative for weakness, light-headedness, numbness and headaches.  Hematological: Negative for adenopathy. Does not bruise/bleed easily.  Psychiatric/Behavioral: Negative for dysphoric mood. The patient is not nervous/anxious.      Objective:   Physical Exam  Constitutional: She appears well-developed and well-nourished. No distress.  obese and well appearing   HENT:  Head: Normocephalic and atraumatic.  Right Ear: External ear normal.  Left Ear:  External ear normal.  Mouth/Throat: Oropharynx is clear and moist.  Eyes: Conjunctivae and EOM are normal. Pupils are equal, round, and reactive to light. No scleral icterus.  Neck: Normal range of motion. Neck supple. No JVD present. Carotid bruit is not present. No thyromegaly present.  Cardiovascular: Normal rate, regular rhythm, normal heart sounds and intact distal pulses.  Exam reveals no gallop.   Pulmonary/Chest: Effort  normal and breath sounds normal. No respiratory distress. She has no wheezes. She exhibits no tenderness.  Abdominal: Soft. Bowel sounds are normal. She exhibits no distension, no abdominal bruit and no mass. There is no tenderness.  Genitourinary: Vagina normal and uterus normal. No breast swelling, tenderness, discharge or bleeding. There is no rash, tenderness or lesion on the right labia. There is no rash, tenderness or lesion on the left labia. Uterus is not enlarged and not tender. Cervix exhibits no motion tenderness, no discharge and no friability. Right adnexum displays no mass, no tenderness and no fullness. Left adnexum displays no mass, no tenderness and no fullness. No tenderness or bleeding in the vagina. No vaginal discharge found.  Baseline flap sx closure scar on R breast  Breast exam: No mass, nodules, thickening, tenderness, bulging, retraction, inflamation, nipple discharge or skin changes noted.  No axillary or clavicular LA.      o External genitalia -nl appearing  o Urethral meatus -nl appearing/ no polyp or swelling o Urethra  -normally mobile and nt o Bladder -nt / no fullness o Vagina -slt atrophy, no discharge  o Cervix  -nl appearing- not friable and no d/c  o Uterus  - very mild prolapse, not enl/fixed or deviated o Adnexa/parametria -no M palpated  o Anus and perineum -nl app/no hemorrhoids    Musculoskeletal: Normal range of motion. She exhibits no edema or tenderness.  Lymphadenopathy:    She has no cervical adenopathy.  Neurological:  She is alert. She has normal reflexes. No cranial nerve deficit. She exhibits normal muscle tone. Coordination normal.  Skin: Skin is warm and dry. No rash noted. No erythema. No pallor.  Psychiatric: She has a normal mood and affect.          Assessment & Plan:   Problem List Items Addressed This Visit      Endocrine   Fasting hyperglycemia    Lab Results  Component Value Date   HGBA1C 5.7 09/27/2015   Reassuring Enc to keep working on wt loss and low glycemic diet          Musculoskeletal and Integument   Osteoporosis    Due for dexa - no falls or fractures however  Will consider this after she is done with upcoming breast studies and also colonoscopy  Disc need for calcium/ vitamin D/ wt bearing exercise and bone density test every 2 y to monitor Disc safety/ fracture risk in detail          Genitourinary   Uterine prolapse     Other   BREAST CANCER, HX OF    S/p surgery and tamoxifen in the past  Has been cancer free Rev bx (neg) of other breast from fall  Has upcoming bilat dg mm and [place] on L planned on 3/10  Enc regular self exams  Very strong family hx Cannot afford genetic testing       Colon cancer screening    In pt with hx of breast ca personal  Has screening colonoscopy upcoming in April 2017      Encounter for routine gynecological examination    Routine exam with pap done today  Hx of tamoxifen/b cancer in the past  No hx of vaginal bleeding  No c/o except for uterine prolapse- mild and tolerable at this time       Relevant Orders   Cytology - PAP   Hearing loss in left ear   Hyperlipidemia    Disc goals for lipids  and reasons to control them Rev labs with pt Rev low sat fat diet in detail  This is on the rise-may be hereditary as well  She will work on low sat fat diet and re check 3 mo   (consider statin if not improved)       Obesity    Discussed how this problem influences overall health and the risks it imposes  Reviewed  plan for weight loss with lower calorie diet (via better food choices and also portion control or program like weight watchers) and exercise building up to or more than 30 minutes 5 days per week including some aerobic activity   Commended on almost 10 lb wt loss so far with lifestyle change       Routine general medical examination at a health care facility - Primary    Reviewed health habits including diet and exercise and skin cancer prevention Reviewed appropriate screening tests for age  Also reviewed health mt list, fam hx and immunization status , as well as social and family history   See HPI AMW visit reviewed  Labs reviewed Will consider dexa later after other tests  For cholesterol Avoid red meat/ fried foods/ egg yolks/ fatty breakfast meats/ butter, cheese and high fat dairy/ and shellfish   We will re check your cholesterol in 3 months  Other labs looked good Keep working on weight loss with exercise and good water intake and low sugar and fat diet  Pap done today  Get your colonoscopy and breast studies as planned Wear your sun protection

## 2015-10-02 NOTE — Assessment & Plan Note (Addendum)
Routine exam with pap done today  Hx of tamoxifen/b cancer in the past  No hx of vaginal bleeding  No c/o except for uterine prolapse- mild and tolerable at this time

## 2015-10-02 NOTE — Assessment & Plan Note (Signed)
In pt with hx of breast ca personal  Has screening colonoscopy upcoming in April 2017

## 2015-10-02 NOTE — Assessment & Plan Note (Signed)
Due for dexa - no falls or fractures however  Will consider this after she is done with upcoming breast studies and also colonoscopy  Disc need for calcium/ vitamin D/ wt bearing exercise and bone density test every 2 y to monitor Disc safety/ fracture risk in detail

## 2015-10-02 NOTE — Patient Instructions (Addendum)
For cholesterol Avoid red meat/ fried foods/ egg yolks/ fatty breakfast meats/ butter, cheese and high fat dairy/ and shellfish   We will re check your cholesterol in 3 months  Other labs looked good Keep working on weight loss with exercise and good water intake and low sugar and fat diet  Pap done today  Get your colonoscopy and breast studies as planned Wear your sun protection

## 2015-10-02 NOTE — Assessment & Plan Note (Signed)
Lab Results  Component Value Date   HGBA1C 5.7 09/27/2015   Reassuring Enc to keep working on wt loss and low glycemic diet

## 2015-10-02 NOTE — Progress Notes (Signed)
Pre visit review using our clinic review tool, if applicable. No additional management support is needed unless otherwise documented below in the visit note. 

## 2015-10-02 NOTE — Assessment & Plan Note (Signed)
Disc goals for lipids and reasons to control them Rev labs with pt Rev low sat fat diet in detail  This is on the rise-may be hereditary as well  She will work on low sat fat diet and re check 3 mo   (consider statin if not improved)

## 2015-10-04 ENCOUNTER — Ambulatory Visit
Admission: RE | Admit: 2015-10-04 | Discharge: 2015-10-04 | Disposition: A | Discharge status: Home / Self Care | Payer: PPO | Source: Ambulatory Visit | Attending: Family Medicine | Admitting: Family Medicine

## 2015-10-04 DIAGNOSIS — N63 Unspecified lump in breast: Secondary | ICD-10-CM | POA: Diagnosis not present

## 2015-10-04 DIAGNOSIS — N6320 Unspecified lump in the left breast, unspecified quadrant: Secondary | ICD-10-CM

## 2015-10-04 LAB — CYTOLOGY - PAP

## 2015-10-08 ENCOUNTER — Encounter: Payer: Self-pay | Admitting: *Deleted

## 2015-11-19 ENCOUNTER — Encounter: Payer: PPO | Admitting: Gastroenterology

## 2015-11-29 ENCOUNTER — Other Ambulatory Visit: Payer: Self-pay | Admitting: Family Medicine

## 2016-02-28 ENCOUNTER — Other Ambulatory Visit: Payer: Self-pay | Admitting: Family Medicine

## 2016-02-28 DIAGNOSIS — Z1231 Encounter for screening mammogram for malignant neoplasm of breast: Secondary | ICD-10-CM

## 2016-03-23 ENCOUNTER — Encounter: Payer: Self-pay | Admitting: Family Medicine

## 2016-03-23 ENCOUNTER — Ambulatory Visit (INDEPENDENT_AMBULATORY_CARE_PROVIDER_SITE_OTHER): Payer: PPO | Admitting: Family Medicine

## 2016-03-23 VITALS — BP 128/78 | HR 83 | Temp 98.1°F | Ht 66.0 in | Wt 191.0 lb

## 2016-03-23 DIAGNOSIS — R519 Headache, unspecified: Secondary | ICD-10-CM | POA: Insufficient documentation

## 2016-03-23 DIAGNOSIS — R51 Headache: Secondary | ICD-10-CM

## 2016-03-23 DIAGNOSIS — J302 Other seasonal allergic rhinitis: Secondary | ICD-10-CM | POA: Diagnosis not present

## 2016-03-23 LAB — SEDIMENTATION RATE: Sed Rate: 5 mm/hr (ref 0–30)

## 2016-03-23 MED ORDER — FLUTICASONE PROPIONATE 50 MCG/ACT NA SUSP: 2.0000 | Freq: Every day | NASAL | 6 refills | Status: DC

## 2016-03-23 NOTE — Progress Notes (Signed)
Subjective:    Patient ID: Ashley Rasmussen, female    DOB: 1950-08-04, 65 y.o.   MRN: [id number]  HPI Here with sinus and ear symptoms  L ear is hurting -- and also "drumming" - like a sound - feels like there is fluid in it  (already knows she has hearing loss)  Also L eye and around it /sinus areas  No color to nasal d/c  Scratchy throat with pnd  Cough - is dry/non productive  No fever   No dental issues she knows of (has upper plate)   Taking otc allergy med/ ? What is in it - it helps but dries her up    Patient Active Problem List   Diagnosis Date Noted  . Pain, head and face 03/23/2016  . Hearing loss in left ear 10/02/2015  . Left foot pain 09/27/2015  . GERD (gastroesophageal reflux disease) 05/08/2015  . Obesity 12/13/2012  . Encounter for routine gynecological examination 08/16/2012  . Colon cancer screening 08/16/2012  . Fasting hyperglycemia 08/16/2012  . Routine general medical examination at a health care facility 08/08/2012  . Uterine prolapse 07/06/2012  . BACK PAIN, LUMBAR, WITH RADICULOPATHY 08/15/2010  . Hyperlipidemia 04/13/2008  . Osteoporosis 04/06/2008  . Allergic rhinitis 04/15/2007  . BREAST CANCER, HX OF 04/15/2007   Past Medical History:  Diagnosis Date  . Allergic rhinitis, cause unspecified   . HLD (hyperlipidemia)   . Osteopenia   . Osteoporosis, unspecified   . Personal history of malignant neoplasm of breast    needs yearly mammogram and MRI every 2  . Thoracic or lumbosacral neuritis or radiculitis, unspecified    Past Surgical History:  Procedure Laterality Date  . Breast cancer excision    . LATISSIMUS FLAP TO BREAST     due to radiation burn   Social History  Substance Use Topics  . Smoking status: Former Research scientist (life sciences)  . Smokeless tobacco: Never Used     Comment: quit over 73yr ago  . Alcohol use No   Family History  Problem Relation Age of Onset  . Other Mother     B-12 deficiency  . Breast cancer Mother   . Melanoma  Mother   . Myelodysplastic syndrome Mother   . Osteoporosis Mother   . Other Sister     B-12 deficiency  . Breast cancer Sister   . Osteoporosis Sister   . Breast cancer      Multiple cousins   Allergies  Allergen Reactions  . Alendronate Sodium     REACTION: GI side effects  . Pravachol     myalgias  . Raloxifene     REACTION: breast tenderness and leg pain  . Simvastatin     REACTION: myalgias   Current Outpatient Prescriptions on File Prior to Visit  Medication Sig Dispense Refill  . diclofenac sodium (VOLTAREN) 1 % GEL Apply 2 g topically 3 (three) times daily as needed (to affected area on foot). 100 g 1  . Multiple Vitamin (MULTIVITAMIN) tablet Take 3 tablets by mouth daily.     . ranitidine (ZANTAC) 150 MG tablet TAKE 1 TABLET BY MOUTH TWICE A DAY 60 tablet 6   No current facility-administered medications on file prior to visit.     Review of Systems    Review of Systems  Constitutional: Negative for fever, appetite change,  and unexpected weight change.  Eyes: Negative for pain and visual disturbance.  ENT pos for intermittent nasal and ear congestion , neg for purulent  drainage  Respiratory: Negative for cough and shortness of breath.   Cardiovascular: Negative for cp or palpitations    Gastrointestinal: Negative for nausea, diarrhea and constipation.  Genitourinary: Negative for urgency and frequency.  Skin: Negative for pallor or rash   Neurological: Negative for weakness, light-headedness, numbness and pos for headaches. neg for facial droop or slurred speech  Hematological: Negative for adenopathy. Does not bruise/bleed easily.  Psychiatric/Behavioral: Negative for dysphoric mood. The patient is not nervous/anxious.     Objective:   Physical Exam  Constitutional: She is oriented to person, place, and time. She appears well-developed and well-nourished. No distress.  obese and well appearing   HENT:  Head: Normocephalic and atraumatic.  Right Ear:  External ear normal.  Left Ear: External ear normal.  Nose: Nose normal.  Mouth/Throat: Oropharynx is clear and moist. No oropharyngeal exudate.  No sinus tenderness L temporal tenderness No TMJ tenderness  R ear canal- partial cerumen L ear canal clear/ TM is dull -no erythema   Eyes: Conjunctivae and EOM are normal. Pupils are equal, round, and reactive to light. Right eye exhibits no discharge. Left eye exhibits no discharge. No scleral icterus.  No nystagmus  Neck: Normal range of motion and full passive range of motion without pain. Neck supple. No JVD present. Carotid bruit is not present. No tracheal deviation present. No thyromegaly present.  Cardiovascular: Normal rate, regular rhythm and normal heart sounds.   No murmur heard. Pulmonary/Chest: Effort normal and breath sounds normal. No respiratory distress. She has no wheezes. She has no rales.  Abdominal: Soft. Bowel sounds are normal. She exhibits no distension and no mass. There is no tenderness.  Musculoskeletal: She exhibits no edema or tenderness.  Lymphadenopathy:    She has no cervical adenopathy.  Neurological: She is alert and oriented to person, place, and time. She has normal reflexes. She displays no atrophy and no tremor. No cranial nerve deficit. She exhibits normal muscle tone. Coordination and gait normal.  No focal cerebellar signs   Skin: Skin is warm and dry. No rash noted. No pallor.  Psychiatric: She has a normal mood and affect. Her behavior is normal. Thought content normal.          Assessment & Plan:   Problem List Items Addressed This Visit      Respiratory   Allergic rhinitis - Primary    Suspect this is causing ETD and facial pressure  Will try flonase ns generic and report back  Allergen avoidance as well  Antihistamine if sneezing or rhinorrhea         Other   Pain, head and face    L side -some temporal tenderness While sinus issues are the likely cause  (flonase px) for this and  ETD Still need to r/o Temporal Arteritis ESR drawn today-disc this and if pos may need prednisone and further eval  Will update if any vision changes       Relevant Orders   Sedimentation Rate    Other Visit Diagnoses   None.

## 2016-03-23 NOTE — Assessment & Plan Note (Signed)
L side -some temporal tenderness While sinus issues are the likely cause  (flonase px) for this and ETD Still need to r/o Temporal Arteritis ESR drawn today-disc this and if pos may need prednisone and further eval  Will update if any vision changes

## 2016-03-23 NOTE — Progress Notes (Signed)
Pre visit review using our clinic review tool, if applicable. No additional management support is needed unless otherwise documented below in the visit note. 

## 2016-03-23 NOTE — Patient Instructions (Signed)
I think your head and ear pain are from allergies and congestion in the ear tube Get the fluticasone nasal spray and use as directed   Also we will draw blood for a less likely condition called temporal arteritis -will let you know  Drink lots of fluids and also warm compress over sinuses Update if not starting to improve in a week or if worsening

## 2016-03-23 NOTE — Assessment & Plan Note (Signed)
Suspect this is causing ETD and facial pressure  Will try flonase ns generic and report back  Allergen avoidance as well  Antihistamine if sneezing or rhinorrhea

## 2016-04-01 ENCOUNTER — Ambulatory Visit: Payer: PPO

## 2016-05-08 ENCOUNTER — Ambulatory Visit
Admission: RE | Admit: 2016-05-08 | Discharge: 2016-05-08 | Disposition: A | Discharge status: Home / Self Care | Payer: PPO | Source: Ambulatory Visit | Attending: Family Medicine | Admitting: Family Medicine

## 2016-05-08 DIAGNOSIS — Z1231 Encounter for screening mammogram for malignant neoplasm of breast: Secondary | ICD-10-CM

## 2016-05-19 ENCOUNTER — Ambulatory Visit: Payer: PPO

## 2016-05-22 ENCOUNTER — Ambulatory Visit (INDEPENDENT_AMBULATORY_CARE_PROVIDER_SITE_OTHER): Payer: PPO

## 2016-05-22 DIAGNOSIS — Z23 Encounter for immunization: Secondary | ICD-10-CM | POA: Diagnosis not present

## 2016-05-25 DIAGNOSIS — H1045 Other chronic allergic conjunctivitis: Secondary | ICD-10-CM | POA: Diagnosis not present

## 2016-05-25 DIAGNOSIS — H524 Presbyopia: Secondary | ICD-10-CM | POA: Diagnosis not present

## 2016-05-25 DIAGNOSIS — H2513 Age-related nuclear cataract, bilateral: Secondary | ICD-10-CM | POA: Diagnosis not present

## 2016-05-25 DIAGNOSIS — H04123 Dry eye syndrome of bilateral lacrimal glands: Secondary | ICD-10-CM | POA: Diagnosis not present

## 2016-07-16 ENCOUNTER — Other Ambulatory Visit: Payer: Self-pay | Admitting: Family Medicine

## 2016-08-22 ENCOUNTER — Other Ambulatory Visit: Payer: Self-pay | Admitting: Family Medicine

## 2016-08-24 NOTE — Telephone Encounter (Signed)
No recent f/u or CPE and no future appts., please advise  

## 2016-08-24 NOTE — Telephone Encounter (Signed)
Please schedule late spring PE and refill until then

## 2016-08-25 NOTE — Telephone Encounter (Signed)
appt scheduled and med refilled 

## 2016-09-09 ENCOUNTER — Ambulatory Visit (INDEPENDENT_AMBULATORY_CARE_PROVIDER_SITE_OTHER): Payer: PPO | Admitting: Family Medicine

## 2016-09-09 ENCOUNTER — Encounter: Payer: Self-pay | Admitting: Family Medicine

## 2016-09-09 VITALS — BP 136/80 | HR 108 | Temp 97.5°F | Wt 193.5 lb

## 2016-09-09 DIAGNOSIS — N3000 Acute cystitis without hematuria: Secondary | ICD-10-CM

## 2016-09-09 DIAGNOSIS — R300 Dysuria: Secondary | ICD-10-CM

## 2016-09-09 LAB — POC URINALSYSI DIPSTICK (AUTOMATED)
Blood, UA: NEGATIVE
Ketones, UA: NEGATIVE
NITRITE UA: POSITIVE
PH UA: 5
PROTEIN UA: NEGATIVE
Spec Grav, UA: >=1.03
Urobilinogen, UA: 2

## 2016-09-09 MED ORDER — CEPHALEXIN 500 MG PO CAPS: 500.0000 mg | ORAL_CAPSULE | Freq: Two times a day (BID) | ORAL | 0 refills | Status: DC

## 2016-09-09 NOTE — Patient Instructions (Addendum)
You do have UTI - treat with keflex twice daily with food for 1 week. Push fluids and rest. May try cranberry juice.  Let us know if not improving with treatment.    Urinary Tract Infection, Adult A urinary tract infection (UTI) is an infection of any part of the urinary tract, which includes the kidneys, ureters, bladder, and urethra. These organs make, store, and get rid of urine in the body. UTI can be a bladder infection (cystitis) or kidney infection (pyelonephritis). What are the causes? This infection may be caused by fungi, viruses, or bacteria. Bacteria are the most common cause of UTIs. This condition can also be caused by repeated incomplete emptying of the bladder during urination. What increases the risk? This condition is more likely to develop if:  You ignore your need to urinate or hold urine for long periods of time.  You do not empty your bladder completely during urination.  You wipe back to front after urinating or having a bowel movement, if you are female.  You are uncircumcised, if you are female.  You are constipated.  You have a urinary catheter that stays in place (indwelling).  You have a weak defense (immune) system.  You have a medical condition that affects your bowels, kidneys, or bladder.  You have diabetes.  You take antibiotic medicines frequently or for long periods of time, and the antibiotics no longer work well against certain types of infections (antibiotic resistance).  You take medicines that irritate your urinary tract.  You are exposed to chemicals that irritate your urinary tract.  You are female. What are the signs or symptoms? Symptoms of this condition include:  Fever.  Frequent urination or passing small amounts of urine frequently.  Needing to urinate urgently.  Pain or burning with urination.  Urine that smells bad or unusual.  Cloudy urine.  Pain in the lower abdomen or back.  Trouble urinating.  Blood in the  urine.  Vomiting or being less hungry than normal.  Diarrhea or abdominal pain.  Vaginal discharge, if you are female. How is this diagnosed? This condition is diagnosed with a medical history and physical exam. You will also need to provide a urine sample to test your urine. Other tests may be done, including:  Blood tests.  Sexually transmitted disease (STD) testing. If you have had more than one UTI, a cystoscopy or imaging studies may be done to determine the cause of the infections. How is this treated? Treatment for this condition often includes a combination of two or more of the following:  Antibiotic medicine.  Other medicines to treat less common causes of UTI.  Over-the-counter medicines to treat pain.  Drinking enough water to stay hydrated. Follow these instructions at home:  Take over-the-counter and prescription medicines only as told by your health care provider.  If you were prescribed an antibiotic, take it as told by your health care provider. Do not stop taking the antibiotic even if you start to feel better.  Avoid alcohol, caffeine, tea, and carbonated beverages. They can irritate your bladder.  Drink enough fluid to keep your urine clear or pale yellow.  Keep all follow-up visits as told by your health care provider. This is important.  Make sure to:  Empty your bladder often and completely. Do not hold urine for long periods of time.  Empty your bladder before and after sex.  Wipe from front to back after a bowel movement if you are female. Use each tissue one  time when you wipe. Contact a health care provider if:  You have back pain.  You have a fever.  You feel nauseous or vomit.  Your symptoms do not get better after 3 days.  Your symptoms go away and then return. Get help right away if:  You have severe back pain or lower abdominal pain.  You are vomiting and cannot keep down any medicines or water. This information is not  intended to replace advice given to you by your health care provider. Make sure you discuss any questions you have with your health care provider. Document Released: 04/22/2005 Document Revised: 12/25/2015 Document Reviewed: 06/03/2015 Elsevier Interactive Patient Education  2017 [employer].

## 2016-09-09 NOTE — Progress Notes (Signed)
Pre visit review using our clinic review tool, if applicable. No additional management support is needed unless otherwise documented below in the visit note. 

## 2016-09-09 NOTE — Assessment & Plan Note (Signed)
UA and story consistent with acute uncomplicated cystitis. Treat with keflex 500mg  BID x 7 days. UCx not sent. Discussed further preventative measures.

## 2016-09-09 NOTE — Progress Notes (Signed)
BP 136/80   Pulse (!) 108   Temp 97.5 F (36.4 C) (Oral)   Wt 193 lb 8 oz (87.8 kg)   BMI 31.23 kg/m    CC: dysuria Subjective:    Patient ID: Ashley Rasmussen, female    DOB: [birth year], 66 y.o.   MRN: [id number]  HPI: Ashley Rasmussen is a 66 y.o. female presenting on 09/09/2016 for Urinary Tract Infection (urgency and pain)   1 week h/o increased urgency, frequency, mild dysuria. No hematuria, fevers/chills, nausea, flank pain. + suprapubic abd pressure.   Taking OTC azo pills.  Last UTI was several years ago. Does not frequently get UTIs.  She has been holding urine .  Relevant past medical, surgical, family and social history reviewed and updated as indicated. Interim medical history since our last visit reviewed. Allergies and medications reviewed and updated. Current Outpatient Prescriptions on File Prior to Visit  Medication Sig  . Multiple Vitamin (MULTIVITAMIN) tablet Take 3 tablets by mouth daily.   . ranitidine (ZANTAC) 150 MG tablet Take 1 tablet (150 mg total) by mouth 2 (two) times daily.  . diclofenac sodium (VOLTAREN) 1 % GEL Apply 2 g topically 3 (three) times daily as needed (to affected area on foot). (Patient not taking: Reported on 09/09/2016)  . fluticasone (FLONASE) 50 MCG/ACT nasal spray Place 2 sprays into both nostrils daily. Through the allergy season (Patient not taking: Reported on 09/09/2016)   No current facility-administered medications on file prior to visit.     Review of Systems Per HPI unless specifically indicated in ROS section     Objective:    BP 136/80   Pulse (!) 108   Temp 97.5 F (36.4 C) (Oral)   Wt 193 lb 8 oz (87.8 kg)   BMI 31.23 kg/m   Wt Readings from Last 3 Encounters:  09/09/16 193 lb 8 oz (87.8 kg)  03/23/16 191 lb (86.6 kg)  10/02/15 185 lb 12 oz (84.3 kg)    Physical Exam  Constitutional: She appears well-developed and well-nourished. No distress.  Abdominal: Soft. Normal appearance and bowel sounds are normal.  She exhibits no distension and no mass. There is no hepatosplenomegaly. There is tenderness (mild) in the suprapubic area. There is no rebound, no guarding and no CVA tenderness.  Nursing note and vitals reviewed.  Results for orders placed or performed in visit on 09/09/16  POCT Urinalysis Dipstick (Automated)  Result Value Ref Range   Color, UA Orange    Clarity, UA Hazy    Glucose, UA Trace    Bilirubin, UA 3+    Ketones, UA Negative    Spec Grav, UA >=1.030    Blood, UA Negative    pH, UA 5.0    Protein, UA Negative    Urobilinogen, UA 2.0    Nitrite, UA Positive    Leukocytes, UA large (3+) (A) Negative      Assessment & Plan:   Problem List Items Addressed This Visit    UTI (urinary tract infection) - Primary    UA and story consistent with acute uncomplicated cystitis. Treat with keflex 500mg  BID x 7 days. UCx not sent. Discussed further preventative measures.       Relevant Medications   cephALEXin (KEFLEX) 500 MG capsule    Other Visit Diagnoses    Dysuria       Relevant Orders   POCT Urinalysis Dipstick (Automated) (Completed)       Follow up plan: Return if symptoms worsen or  fail to improve.  Ria Bush, MD

## 2016-09-10 ENCOUNTER — Ambulatory Visit: Payer: PPO | Admitting: Primary Care

## 2016-09-29 ENCOUNTER — Emergency Department (HOSPITAL_COMMUNITY): Payer: PPO

## 2016-09-29 ENCOUNTER — Encounter (HOSPITAL_COMMUNITY): Payer: Self-pay | Admitting: Emergency Medicine

## 2016-09-29 ENCOUNTER — Observation Stay (HOSPITAL_COMMUNITY)
Admission: EM | Admit: 2016-09-29 | Discharge: 2016-10-01 | Disposition: A | Discharge status: Home / Self Care | Payer: PPO | Attending: Internal Medicine | Admitting: Internal Medicine

## 2016-09-29 DIAGNOSIS — R509 Fever, unspecified: Secondary | ICD-10-CM

## 2016-09-29 DIAGNOSIS — Z853 Personal history of malignant neoplasm of breast: Secondary | ICD-10-CM | POA: Insufficient documentation

## 2016-09-29 DIAGNOSIS — R000 Tachycardia, unspecified: Secondary | ICD-10-CM | POA: Diagnosis not present

## 2016-09-29 DIAGNOSIS — R0789 Other chest pain: Secondary | ICD-10-CM | POA: Diagnosis not present

## 2016-09-29 DIAGNOSIS — R079 Chest pain, unspecified: Secondary | ICD-10-CM | POA: Diagnosis not present

## 2016-09-29 DIAGNOSIS — Z87891 Personal history of nicotine dependence: Secondary | ICD-10-CM | POA: Insufficient documentation

## 2016-09-29 DIAGNOSIS — H6692 Otitis media, unspecified, left ear: Secondary | ICD-10-CM

## 2016-09-29 DIAGNOSIS — E785 Hyperlipidemia, unspecified: Secondary | ICD-10-CM | POA: Diagnosis present

## 2016-09-29 DIAGNOSIS — K219 Gastro-esophageal reflux disease without esophagitis: Secondary | ICD-10-CM | POA: Diagnosis not present

## 2016-09-29 DIAGNOSIS — B9789 Other viral agents as the cause of diseases classified elsewhere: Secondary | ICD-10-CM

## 2016-09-29 DIAGNOSIS — R002 Palpitations: Secondary | ICD-10-CM

## 2016-09-29 DIAGNOSIS — J069 Acute upper respiratory infection, unspecified: Secondary | ICD-10-CM | POA: Diagnosis not present

## 2016-09-29 DIAGNOSIS — E784 Other hyperlipidemia: Secondary | ICD-10-CM

## 2016-09-29 DIAGNOSIS — Z79899 Other long term (current) drug therapy: Secondary | ICD-10-CM | POA: Diagnosis not present

## 2016-09-29 LAB — URINALYSIS, ROUTINE W REFLEX MICROSCOPIC
Bilirubin Urine: NEGATIVE
GLUCOSE, UA: NEGATIVE mg/dL
Hgb urine dipstick: NEGATIVE
Ketones, ur: NEGATIVE mg/dL
LEUKOCYTES UA: NEGATIVE
Nitrite: NEGATIVE
PH: 6 (ref 5.0–8.0)
Protein, ur: NEGATIVE mg/dL
Specific Gravity, Urine: 1.017 (ref 1.005–1.030)

## 2016-09-29 LAB — CBC
HCT: 42.3 % (ref 36.0–46.0)
HEMATOCRIT: 43.2 % (ref 36.0–46.0)
HEMOGLOBIN: 14.1 g/dL (ref 12.0–15.0)
Hemoglobin: 14.5 g/dL (ref 12.0–15.0)
MCH: 29.3 pg (ref 26.0–34.0)
MCH: 29.8 pg (ref 26.0–34.0)
MCHC: 33.3 g/dL (ref 30.0–36.0)
MCHC: 33.6 g/dL (ref 30.0–36.0)
MCV: 87.9 fL (ref 78.0–100.0)
MCV: 88.7 fL (ref 78.0–100.0)
Platelets: 228 10*3/uL (ref 150–400)
Platelets: 194 10*3/uL (ref 150–400)
RBC: 4.81 MIL/uL (ref 3.87–5.11)
RBC: 4.87 MIL/uL (ref 3.87–5.11)
RDW: 13.1 % (ref 11.5–15.5)
RDW: 13.3 % (ref 11.5–15.5)
WBC: 12 10*3/uL — ABNORMAL HIGH (ref 4.0–10.5)
WBC: 12.3 10*3/uL — AB (ref 4.0–10.5)

## 2016-09-29 LAB — I-STAT TROPONIN, ED: TROPONIN I, POC: 0 ng/mL (ref 0.00–0.08)

## 2016-09-29 LAB — BASIC METABOLIC PANEL
ANION GAP: 11 (ref 5–15)
BUN: 10 mg/dL (ref 6–20)
CALCIUM: 9.6 mg/dL (ref 8.9–10.3)
CO2: 22 mmol/L (ref 22–32)
Chloride: 105 mmol/L (ref 101–111)
Creatinine, Ser: 1.09 mg/dL — ABNORMAL HIGH (ref 0.44–1.00)
GFR calc non Af Amer: 52 mL/min — ABNORMAL LOW (ref >60)
GFR, EST AFRICAN AMERICAN: 60 mL/min — AB (ref >60)
Glucose, Bld: 136 mg/dL — ABNORMAL HIGH (ref 65–99)
POTASSIUM: 3.7 mmol/L (ref 3.5–5.1)
Sodium: 138 mmol/L (ref 135–145)

## 2016-09-29 LAB — RAPID URINE DRUG SCREEN, HOSP PERFORMED
Amphetamines: NOT DETECTED
BENZODIAZEPINES: NOT DETECTED
Barbiturates: NOT DETECTED
COCAINE: NOT DETECTED
OPIATES: NOT DETECTED
TETRAHYDROCANNABINOL: NOT DETECTED

## 2016-09-29 LAB — INFLUENZA PANEL BY PCR (TYPE A & B)
Influenza A By PCR: NEGATIVE
Influenza B By PCR: NEGATIVE

## 2016-09-29 LAB — TROPONIN I
Troponin I: <0.03 ng/mL (ref <0.03)
Troponin I: <0.03 ng/mL (ref <0.03)

## 2016-09-29 LAB — CREATININE, SERUM: CREATININE: 0.93 mg/dL (ref 0.44–1.00)

## 2016-09-29 LAB — TSH: TSH: 1.122 u[IU]/mL (ref 0.350–4.500)

## 2016-09-29 LAB — D-DIMER, QUANTITATIVE: D-Dimer, Quant: 0.32 ug/mL-FEU (ref 0.00–0.50)

## 2016-09-29 LAB — MAGNESIUM: MAGNESIUM: 1.9 mg/dL (ref 1.7–2.4)

## 2016-09-29 MED ORDER — FLUTICASONE PROPIONATE 50 MCG/ACT NA SUSP
1.0000 | Freq: Every day | NASAL | Status: DC
  Administered 2016-09-29 – 2016-10-01 (×3): 1 via NASAL
  Filled 2016-09-29: qty 16

## 2016-09-29 MED ORDER — MORPHINE SULFATE (PF) 4 MG/ML IV SOLN: 2.0000 mg | INTRAVENOUS | Status: DC | PRN

## 2016-09-29 MED ORDER — CIPROFLOXACIN-DEXAMETHASONE 0.3-0.1 % OT SUSP
4.0000 [drp] | Freq: Two times a day (BID) | OTIC | Status: DC
  Administered 2016-09-29 – 2016-09-30 (×2): 4 [drp] via OTIC
  Filled 2016-09-29: qty 7.5

## 2016-09-29 MED ORDER — METOPROLOL TARTRATE 25 MG PO TABS
25.0000 mg | ORAL_TABLET | Freq: Two times a day (BID) | ORAL | Status: DC
  Administered 2016-09-29 – 2016-10-01 (×5): 25 mg via ORAL
  Filled 2016-09-29 (×5): qty 1

## 2016-09-29 MED ORDER — HEPARIN SODIUM (PORCINE) 5000 UNIT/ML IJ SOLN
5000.0000 [IU] | Freq: Three times a day (TID) | INTRAMUSCULAR | Status: DC
  Administered 2016-09-29 – 2016-10-01 (×5): 5000 [IU] via SUBCUTANEOUS
  Filled 2016-09-29 (×5): qty 1

## 2016-09-29 MED ORDER — ONDANSETRON HCL 4 MG/2ML IJ SOLN
4.0000 mg | Freq: Four times a day (QID) | INTRAMUSCULAR | Status: DC | PRN
  Administered 2016-09-29 (×2): 4 mg via INTRAVENOUS
  Filled 2016-09-29 (×2): qty 2

## 2016-09-29 MED ORDER — SODIUM CHLORIDE 0.9 % IV BOLUS (SEPSIS)
500.0000 mL | Freq: Once | INTRAVENOUS | Status: AC
  Administered 2016-09-29: 500 mL via INTRAVENOUS

## 2016-09-29 MED ORDER — METOPROLOL TARTRATE 5 MG/5ML IV SOLN
2.5000 mg | Freq: Once | INTRAVENOUS | Status: AC
  Administered 2016-09-29: 2.5 mg via INTRAVENOUS
  Filled 2016-09-29: qty 5

## 2016-09-29 MED ORDER — FAMOTIDINE 20 MG PO TABS
20.0000 mg | ORAL_TABLET | Freq: Two times a day (BID) | ORAL | Status: DC
  Administered 2016-09-29 – 2016-10-01 (×4): 20 mg via ORAL
  Filled 2016-09-29 (×4): qty 1

## 2016-09-29 MED ORDER — ACETAMINOPHEN 325 MG PO TABS
650.0000 mg | ORAL_TABLET | ORAL | Status: DC | PRN
  Administered 2016-09-29 – 2016-10-01 (×8): 650 mg via ORAL
  Filled 2016-09-29 (×8): qty 2

## 2016-09-29 MED ORDER — GI COCKTAIL ~~LOC~~: 30.0000 mL | Freq: Four times a day (QID) | ORAL | Status: DC | PRN

## 2016-09-29 NOTE — ED Notes (Signed)
Pt ambulated down hall on Sp02 with standby assistance. Pt ambulated down hall. Pt resting heart rate was 95, while ambulating pt's heart rate jumped to 127. Pt's resting Sp02 was 91. While ambulating pt, pt's Sp02 dropped to 88 and then jumped back up to 96.

## 2016-09-29 NOTE — ED Triage Notes (Addendum)
Per [facility]  CP, center heavy in feeling with radiation to R shoulder and jaw on and off for 3 months and worsened tonight with SOB. Cough, dizziness, and weakness for past 3 days. Vagal manuvers failed.  170 HR  R arm restrictions  324 ASA  HR currently 137 upon arrival.  18 LAC.

## 2016-09-29 NOTE — H&P (Addendum)
History and Physical    [name] [id number] DOB: 06/10/1951 DOA: 09/29/2016  PCP: Loura Pardon, MD   Patient coming from: home  Chief Complaint: chest pain, rapid heart rate, cough, congestion   HPI: Ashley Rasmussen is a 66 y.o. female with medical history significant of breast carcinoma treated with surgery and chemotherapy approximately 25 years ago, Hyperlipidemia, GERD and thoracic and lumbosacral radiculopathy who presented to the ED with c/o chest pain and rapid heart rate. Her symptoms have been going on for Pager months, but lately it increased in frequency and intensity.  She describes it as a pressure/pain in the chest that starts from the the right shoulder radiating to the right upper neck and right side of the jaw line going down to the middle of the chest. It was really difficult to say what provokes pain palpitations or initially she experiences the pain followed by a rapid heart rate, but she describes her heart is racing and giving her sensation of fullness in the chest and in the throat. Few days ago she was having lunch with her daughter at the restaurant when she developed the same symptoms and felt like she was going to have a heart attack right at that place  She also complains of nasal congestion, pain in the left ear that has been going on for couple of weeks and during the last 2 or 3 days she started coughing, had low-grade fever at home, sore throat, myalgia. She has been in contact with her grandchildren who had flu  ED Course: On arrival to the emergency room patient was found to be tachycardic with heart rate of 158, blood pressure was stable 115/81 but soon after it increased to 157/102, her initial O2 sat is O2 sats were above 92% but she had the intermittent episodes of desaturation to 88% when she was ambulating well chair to the bed on arrival and almost instantaneously jumped back up to 96% on ambient air EKG on arrival showed tachycardia-SVT versus sinus  rhythm versus atrial flutter with LVH and some ST-T wave depression in inferior leads which could be repolarization abnormality. Chest x-ray didn't reveal any active disease Blood work showed elevated white blood cells count-12,000, mildly elevated creatinine 1.09 and elevated serum glucose at 136, troponin -0.00 Urinalysis was negative for UTI  Review of Systems: As per HPI otherwise 10 point review of systems negative.   Ambulatory Status: Independent  Past Medical History:  Diagnosis Date  . Allergic rhinitis, cause unspecified   . HLD (hyperlipidemia)   . Osteopenia   . Osteoporosis, unspecified   . Personal history of malignant neoplasm of breast    needs yearly mammogram and MRI every 2  . Thoracic or lumbosacral neuritis or radiculitis, unspecified     Past Surgical History:  Procedure Laterality Date  . Breast cancer excision    . LATISSIMUS FLAP TO BREAST     due to radiation burn    Social History   Social History  . Marital status: Married    Spouse name: N/A  . Number of children: N/A  . Years of education: N/A   Occupational History  . Not on file.   Social History Main Topics  . Smoking status: Former Research scientist (life sciences)  . Smokeless tobacco: Never Used     Comment: quit over 2yrs ago  . Alcohol use No  . Drug use: No  . Sexual activity: No   Other Topics Concern  . Not on file   Social  History Narrative   Married      4 children          Allergies  Allergen Reactions  . Alendronate Sodium     REACTION: GI side effects  . Pravachol     myalgias  . Raloxifene     REACTION: breast tenderness and leg pain  . Simvastatin     REACTION: myalgias    Family History  Problem Relation Age of Onset  . Other Mother     B-12 deficiency  . Breast cancer Mother   . Melanoma Mother   . Myelodysplastic syndrome Mother   . Osteoporosis Mother   . Other Sister     B-12 deficiency  . Breast cancer Sister   . Osteoporosis Sister   . Breast cancer       Multiple cousins    Prior to Admission medications   Medication Sig Start Date End Date Taking? Authorizing Provider  loratadine (CLARITIN) 10 MG tablet Take 10 mg by mouth daily as needed for allergies.   Yes Historical Provider, MD  Multiple Vitamin (MULTIVITAMIN) tablet Take 3 tablets by mouth daily.    Yes Historical Provider, MD  ranitidine (ZANTAC) 150 MG tablet Take 1 tablet (150 mg total) by mouth 2 (two) times daily. 08/25/16  Yes Abner Greenspan, MD    Physical Exam: Vitals:   09/29/16 0700 09/29/16 0730 09/29/16 0800 09/29/16 0830  BP: 138/97 144/92 140/95 146/83  Pulse: 89  110 103  Resp: 25 22 21 22   Temp:      TempSrc:      SpO2: 95%  98% 96%     General: Appears calm and comfortable Eyes: PERRLA, EOMI, normal lids, iris ENT:  grossly normal hearing, lips & tongue, oral mucous membranes moist and intact, left ear tympanic membrane erythematous and mildly bulging, left nasal mucosa irritated with clear discharge Neck: no lymphoadenopathy, masses or thyromegaly Cardiovascular: Rapid RR, no m/r/g. No JVD, carotid bruits. No LE edema.  Respiratory: bilateral no wheezes, rales, rhonchi or cracles. Normal respiratory effort. No accessory muscle use observed Abdomen: soft, non-tender, non-distended, no organomegaly or masses appreciated. BS present in all quadrants Skin: no rash, ulcers or induration seen on limited exam Musculoskeletal: grossly normal tone BUE/BLE, good ROM, no bony abnormality or joint deformities observed Psychiatric: grossly normal mood and affect, speech fluent and appropriate, alert and oriented x3 Neurologic: CN II-XII grossly intact, moves all extremities in coordinated fashion, sensation intact  Labs on Admission: I have personally reviewed following labs and imaging studies  CBC, BMP  GFR: CrCl cannot be calculated (Unknown ideal weight.).   Creatinine Clearance: CrCl cannot be calculated (Unknown ideal weight.).    Radiological Exams on  Admission: Dg Chest Portable 1 View  Result Date: 09/29/2016 CLINICAL DATA:  Right shoulder pain and chest pain tonight. EXAM: PORTABLE CHEST 1 VIEW COMPARISON:  None. FINDINGS: A single AP portable view of the chest demonstrates no focal airspace consolidation or alveolar edema. The lungs are grossly clear. There is no large effusion or pneumothorax. Cardiac and mediastinal contours appear unremarkable. IMPRESSION: No active disease. Electronically Signed   By: Andreas Newport M.D.   On: 09/29/2016 02:17    EKG: Independently reviewed - Sinus tachycardia, possible LVH with repolarization abnorrmality   Assessment/Plan Principal Problem:   Chest pain Active Problems:   Hyperlipidemia   BREAST CANCER, HX OF   Fasting hyperglycemia   GERD (gastroesophageal reflux disease)   Viral upper respiratory illness   Otitis media  of left ear   Chest pain with symptomatic tachyarrhythmia First set of cardiac enzymes negative Start oral Lopressor for rate control, monitor on telemetry - most likely hse has sinus tachy vs SVT vs Aflutter with 2-3:1 AVB NM stress test to r/o underlying ischemia scheduled for tomorrow morning and will take place if troponin remains negative and Echo to assess WMA and EF Risk factors: hyperlipidemia, gender, age. Telemetry still shows mild tachycardia, but with improvement of her HR from 156 to 102 telemetry now  shows normal SR  AKI  Continue to monitor renal function, start mild hydration Probably associated with poor oral intake during the last few days as she has not been feeling well d/t cold/flu symptoms  Hyperglycemia - Hgb A1C was 5.7% in March 2017 Will refresh Hgb A1C  Hyperlipidemia  Continue statin therapy  GERD On H2R blocker - Zantac at home  Left sided otitis media Initiated Ciprodex ear drops bid  Upper respiratory illness - suspect viral Will check flu PCR as patient is taking care of her grandchildren who were having flu symptoms and if  positive, start Tamiflu   DVT prophylaxis: heparin Code Status: full Family Communication: at bedside Disposition Plan: telemetry Consults called: none Admission status: observation   York Grice, [place] Pager: [phone] [facility]  If 7PM-7AM, please contact night-coverage [email or web address] Password [email or web address]  09/29/2016, 8:59 AM     Attending MD note  Patient was seen, examined,treatment plan was discussed with the  Advance Practice Provider.  I have personally reviewed the clinical findings, lab,EKG, imaging studies and management of this patient in detail.I have also reviewed the orders written for this patient which were under my direction. I agree with the documentation, as recorded by the Advance Practice Provider.   Ashley Rasmussen is a 66 y.o. female who presents with CP and tachycardia likely from viral process. Would benefit from NM stress.     Acelyn Basham, [place] Congress, MD Family Medicine See Amion for pager # [facility]

## 2016-09-29 NOTE — ED Provider Notes (Signed)
[facility] DEPT Provider Note   CSN: [id number] Arrival date & time: 09/29/16  0135     History   Chief Complaint Chief Complaint  Patient presents with  . Chest Pain  . Tachycardia    HPI Ashley Rasmussen is a 66 y.o. female with a hx of HLD, GERD, breast cancer (25 years ago Treated with surgery, chemotherapy and radiation - remission since that time) presents to the Emergency Department complaining of acute, persistent, palpitations onset just after midnight with associated SOB, lightheadedness, right arm pain and right jaw pain. Patient denies fever, chills, nausea, vomiting, syncope. No aggravating or alleviating factors at this time. Patient reports that she's had approximately 2 episodes of similar symptoms each week for the last 3 months. She reports symptoms last approximately 10 minutes and then resolve without intervention. Patient reports that tonight this woke her from sleep and it has not resolved. Per EMS they all maneuvers were not helpful in route. Patient reports associated URI symptoms for several days but no other illnesses. No estrogen usage, leg swelling,. The immobilization or history of DVT.  She denies previous cardiac history.   The history is provided by the patient and medical records. No language interpreter was used.    Past Medical History:  Diagnosis Date  . Allergic rhinitis, cause unspecified   . HLD (hyperlipidemia)   . Osteopenia   . Osteoporosis, unspecified   . Personal history of malignant neoplasm of breast    needs yearly mammogram and MRI every 2  . Thoracic or lumbosacral neuritis or radiculitis, unspecified     Patient Active Problem List   Diagnosis Date Noted  . Chest pain 09/29/2016  . Pain, head and face 03/23/2016  . Hearing loss in left ear 10/02/2015  . Left foot pain 09/27/2015  . GERD (gastroesophageal reflux disease) 05/08/2015  . UTI (urinary tract infection) 01/03/2015  . Obesity 12/13/2012  . Encounter for routine  gynecological examination 08/16/2012  . Colon cancer screening 08/16/2012  . Fasting hyperglycemia 08/16/2012  . Routine general medical examination at a health care facility 08/08/2012  . Uterine prolapse 07/06/2012  . BACK PAIN, LUMBAR, WITH RADICULOPATHY 08/15/2010  . Hyperlipidemia 04/13/2008  . Osteoporosis 04/06/2008  . Allergic rhinitis 04/15/2007  . BREAST CANCER, HX OF 04/15/2007    Past Surgical History:  Procedure Laterality Date  . Breast cancer excision    . LATISSIMUS FLAP TO BREAST     due to radiation burn    OB History    No data available       Home Medications    Prior to Admission medications   Medication Sig Start Date End Date Taking? Authorizing Provider  loratadine (CLARITIN) 10 MG tablet Take 10 mg by mouth daily as needed for allergies.   Yes Historical Provider, MD  Multiple Vitamin (MULTIVITAMIN) tablet Take 3 tablets by mouth daily.    Yes Historical Provider, MD  ranitidine (ZANTAC) 150 MG tablet Take 1 tablet (150 mg total) by mouth 2 (two) times daily. 08/25/16  Yes Abner Greenspan, MD    Family History Family History  Problem Relation Age of Onset  . Other Mother     B-12 deficiency  . Breast cancer Mother   . Melanoma Mother   . Myelodysplastic syndrome Mother   . Osteoporosis Mother   . Other Sister     B-12 deficiency  . Breast cancer Sister   . Osteoporosis Sister   . Breast cancer      Multiple  cousins    Social History Social History  Substance Use Topics  . Smoking status: Former Research scientist (life sciences)  . Smokeless tobacco: Never Used     Comment: quit over 51yrs ago  . Alcohol use No     Allergies   Alendronate sodium; Pravachol; Raloxifene; and Simvastatin   Review of Systems Review of Systems  Cardiovascular: Positive for chest pain and palpitations. Negative for leg swelling.  Musculoskeletal: Positive for arthralgias ( Right UE).  Neurological: Positive for light-headedness.  All other systems reviewed and are  negative.    Physical Exam Updated Vital Signs BP (!) 157/102   Pulse (!) 158   Temp 99.7 F (37.6 C) (Oral)   Resp 22   SpO2 98%   Physical Exam  Constitutional: She appears well-developed and well-nourished. No distress.  Awake, alert, nontoxic appearance  HENT:  Head: Normocephalic and atraumatic.  Mouth/Throat: Oropharynx is clear and moist. No oropharyngeal exudate.  Eyes: Conjunctivae are normal. No scleral icterus.  Neck: Normal range of motion. Neck supple.  Cardiovascular: Regular rhythm and intact distal pulses.  Tachycardia present.   Pulses:      Radial pulses are 2+ on the right side, and 2+ on the left side.       Dorsalis pedis pulses are 2+ on the right side, and 2+ on the left side.  Pulmonary/Chest: Effort normal and breath sounds normal. Tachypnea noted. No respiratory distress. She has no wheezes.  Equal chest expansion  Abdominal: Soft. Bowel sounds are normal. She exhibits no mass. There is no tenderness. There is no rebound and no guarding.  Musculoskeletal: Normal range of motion. She exhibits no edema.  No edema, calf tenderness Negative Homans sign  Neurological: She is alert.  Speech is clear and goal oriented Moves extremities without ataxia  Skin: Skin is warm and dry. She is not diaphoretic.  Psychiatric: She has a normal mood and affect.  Nursing note and vitals reviewed.    ED Treatments / Results  Labs (all labs ordered are listed, but only abnormal results are displayed) Labs Reviewed  BASIC METABOLIC PANEL - Abnormal; Notable for the following:       Result Value   Glucose, Bld 136 (*)    Creatinine, Ser 1.09 (*)    GFR calc non Af Amer 52 (*)    GFR calc Af Amer 60 (*)    All other components within normal limits  CBC - Abnormal; Notable for the following:    WBC 12.0 (*)    All other components within normal limits  D-DIMER, QUANTITATIVE (NOT AT [facility])  RAPID URINE DRUG SCREEN, HOSP PERFORMED  URINALYSIS, ROUTINE W REFLEX  MICROSCOPIC  TSH  T4  I-STAT TROPOININ, ED    EKG  EKG Interpretation  Date/Time:  Tuesday September 29 2016 01:42:43 EST Ventricular Rate:  126 PR Interval:    QRS Duration: 78 QT Interval:  295 QTC Calculation: 427 R Axis:   66 Text Interpretation:  Sinus tachycardia LAE, consider biatrial enlargement Probable LVH with secondary repol abnrm No old tracing to compare Confirmed by [facility]  MD, DAVID ([id number]) on 09/29/2016 1:56:09 AM       EKG Interpretation  Date/Time:  Tuesday September 29 2016 02:57:31 EST Ventricular Rate:  123 PR Interval:    QRS Duration: 71 QT Interval:  298 QTC Calculation: 427 R Axis:   66 Text Interpretation:  Sinus tachycardia Consider right atrial enlargement Probable LVH with secondary repol abnrm When compared with ECG of EARLIER SAME DATE  No significant change was found Confirmed by [facility]  MD, DAVID ([id number]) on 09/29/2016 3:02:56 AM       EKG Interpretation  Date/Time:  Tuesday September 29 2016 04:19:44 EST Ventricular Rate:  99 PR Interval:    QRS Duration: 91 QT Interval:  358 QTC Calculation: 460 R Axis:   69 Text Interpretation:  Sinus rhythm Consider left ventricular hypertrophy When compared with ECG of EARLIER SAME DATE HEART RATE has decreased Confirmed by Roxanne Mins  MD, DAVID ([id number]) on 09/29/2016 4:31:59 AM       Radiology Dg Chest Portable 1 View  Result Date: 09/29/2016 CLINICAL DATA:  Right shoulder pain and chest pain tonight. EXAM: PORTABLE CHEST 1 VIEW COMPARISON:  None. FINDINGS: A single AP portable view of the chest demonstrates no focal airspace consolidation or alveolar edema. The lungs are grossly clear. There is no large effusion or pneumothorax. Cardiac and mediastinal contours appear unremarkable. IMPRESSION: No active disease. Electronically Signed   By: Andreas Newport M.D.   On: 09/29/2016 02:17    Procedures Procedures (including critical care time)  Medications Ordered in ED Medications  sodium chloride 0.9 % bolus 500 mL  (0 mLs Intravenous Stopped 09/29/16 0358)  metoprolol (LOPRESSOR) injection 2.5 mg (2.5 mg Intravenous Given 09/29/16 0229)     Initial Impression / Assessment and Plan / ED Course  I have reviewed the triage vital signs and the nursing notes.  Pertinent labs & imaging results that were available during my care of the patient were reviewed by me and considered in my medical decision making (see chart for details).  Clinical Course as of Sep 30 555  Tue Sep 29, 2016  0224 Hypertension and significant tachycardia on exam.  [HM]  0224 EKG with sinus tachycardia  [HM]  0321 Patient with improvement in her tachycardia. Her chest pain has resolved but she remained short of breath. Heart rate 112  [HM]  0440 Pt with tachycardia to 130 while walking and persistent SOB.    [HM]  0543 D-dimer negative.  Low risk PE.    [HM]  0543 Neg trop Troponin i, poc: 0.00 [HM]  0556 Discussed with Dr. Hal Hope who will admit.  Thyroid pending  [HM]    Clinical Course User Index [HM] Abigail Butts, PA-C    Patient presents with tachycardia and associated symptoms. SVT versus a flutter on EKG. Patient will get fluid bolus, chest x-ray, metoprolol and screening lab work.  Labs reassuring. D-dimer negative. Troponin negative. Patient's heart rate improves with IV metoprolol and rhythm is sinus without evidence of A. fib or a flutter. Patient's chest pain resolved completely at rest. With ambulation she develops tachycardia and shortness of breath. Oxygen saturation drops to 88% with ambulation. Returns to normal at rest.  No history of same. She will need admission for further evaluation.  The patient was discussed with and seen by Dr. Roxanne Mins who agrees with the treatment plan.   Final Clinical Impressions(s) / ED Diagnoses   Final diagnoses:  Tachycardia  Palpitations  Chest pain, unspecified type    New Prescriptions New Prescriptions   No medications on file     Abigail Butts,  PA-C [id number]    David Glick, MD [id number]

## 2016-09-29 NOTE — ED Notes (Signed)
Heart healthy breakfast ordered for patient.

## 2016-09-30 ENCOUNTER — Encounter (HOSPITAL_COMMUNITY): Payer: Self-pay

## 2016-09-30 ENCOUNTER — Observation Stay (HOSPITAL_BASED_OUTPATIENT_CLINIC_OR_DEPARTMENT_OTHER): Payer: PPO

## 2016-09-30 DIAGNOSIS — R079 Chest pain, unspecified: Secondary | ICD-10-CM | POA: Diagnosis not present

## 2016-09-30 DIAGNOSIS — R000 Tachycardia, unspecified: Secondary | ICD-10-CM

## 2016-09-30 DIAGNOSIS — R509 Fever, unspecified: Secondary | ICD-10-CM

## 2016-09-30 DIAGNOSIS — R0789 Other chest pain: Secondary | ICD-10-CM

## 2016-09-30 DIAGNOSIS — R002 Palpitations: Secondary | ICD-10-CM

## 2016-09-30 LAB — NM MYOCAR SINGLE W/SPECT
CHL CUP NUCLEAR SRS: 6
CHL CUP STRESS STAGE 1 GRADE: 0 %
CHL CUP STRESS STAGE 2 HR: 102 {beats}/min
CHL CUP STRESS STAGE 2 SPEED: 0 mph
CHL CUP STRESS STAGE 3 DBP: 66 mmHg
CHL CUP STRESS STAGE 3 HR: 112 {beats}/min
CHL CUP STRESS STAGE 3 SBP: 138 mmHg
CHL CUP STRESS STAGE 4 DBP: 67 mmHg
Exercise duration (min): 5 min
LVDIAVOL: 68 mL (ref 46–106)
LVSYSVOL: 13 mL
MPHR: 154 {beats}/min
Percent HR: 74 %
RATE: 0
Rest HR: 98 {beats}/min
SDS: 3
SSS: 9
Stage 1 HR: 102 {beats}/min
Stage 1 Speed: 0 mph
Stage 2 Grade: 0 %
Stage 3 Grade: 0 %
Stage 3 Speed: 0 mph
Stage 4 Grade: 0 %
Stage 4 HR: 107 {beats}/min
Stage 4 SBP: 130 mmHg
Stage 4 Speed: 0 mph
TID: 1.09

## 2016-09-30 LAB — HEMOGLOBIN A1C
Hgb A1c MFr Bld: 5.6 % (ref 4.8–5.6)
Mean Plasma Glucose: 114 mg/dL

## 2016-09-30 LAB — PROCALCITONIN: PROCALCITONIN: 0.38 ng/mL

## 2016-09-30 LAB — T4: T4 TOTAL: 8 ug/dL (ref 4.5–12.0)

## 2016-09-30 MED ORDER — GUAIFENESIN ER 600 MG PO TB12
600.0000 mg | ORAL_TABLET | Freq: Two times a day (BID) | ORAL | Status: DC
  Administered 2016-09-30 – 2016-10-01 (×2): 600 mg via ORAL
  Filled 2016-09-30 (×2): qty 1

## 2016-09-30 MED ORDER — REGADENOSON 0.4 MG/5ML IV SOLN
0.4000 mg | Freq: Once | INTRAVENOUS | Status: AC
  Administered 2016-09-30: 0.4 mg via INTRAVENOUS
  Filled 2016-09-30: qty 5

## 2016-09-30 MED ORDER — LEVOFLOXACIN 750 MG PO TABS
750.0000 mg | ORAL_TABLET | Freq: Every day | ORAL | Status: DC
  Administered 2016-09-30 – 2016-10-01 (×2): 750 mg via ORAL
  Filled 2016-09-30 (×2): qty 1

## 2016-09-30 MED ORDER — SODIUM CHLORIDE 0.9 % IV SOLN
INTRAVENOUS | Status: DC
  Administered 2016-09-30 – 2016-10-01 (×2): via INTRAVENOUS

## 2016-09-30 MED ORDER — TECHNETIUM TC 99M TETROFOSMIN IV KIT
10.0000 | PACK | Freq: Once | INTRAVENOUS | Status: AC | PRN
  Administered 2016-09-30: 10 via INTRAVENOUS

## 2016-09-30 MED ORDER — TECHNETIUM TC 99M TETROFOSMIN IV KIT
30.0000 | PACK | Freq: Once | INTRAVENOUS | Status: AC | PRN
  Administered 2016-09-30: 30 via INTRAVENOUS

## 2016-09-30 MED ORDER — REGADENOSON 0.4 MG/5ML IV SOLN
INTRAVENOUS | Status: AC
  Administered 2016-09-30: 0.4 mg via INTRAVENOUS
  Filled 2016-09-30: qty 5

## 2016-09-30 MED ORDER — LEVALBUTEROL HCL 0.63 MG/3ML IN NEBU: 0.6300 mg | INHALATION_SOLUTION | Freq: Four times a day (QID) | RESPIRATORY_TRACT | Status: DC | PRN

## 2016-09-30 NOTE — Progress Notes (Signed)
   09/30/16 1125  Clinical Encounter Type  Visited With Patient and family together  Visit Type Initial  Referral From Nurse  Consult/Referral To Chaplain  Recommendations (follow up as needed)  Spiritual Encounters  Spiritual Needs Literature;Brochure  Stress Factors  Patient Stress Factors None identified  Family Stress Factors None identified  Pt was out of the room, not available, gone for routine testing.  Spoke with Spouse of the pt, already knew about the AD and mentioned that the pt did request the information.  Pt will call Pastoral Care when ready to complete Ad.  Left information and forms with Spouse.  Will follow up as needed.  Chaplain Zionna Homewood A. Fulton Merry , MA-PC , BA-REL/PHIL , [phone]

## 2016-09-30 NOTE — Care Management Obs Status (Signed)
[facility] NOTIFICATION   Patient Details  Name: Ashley Rasmussen MRN: [id number] Date of Birth: 03-Nov-1950   Medicare Observation Status Notification Given:  Yes    Bethena Roys, RN 09/30/2016, 5:17 PM

## 2016-09-30 NOTE — Plan of Care (Signed)
Problem: Health Behavior/Discharge Planning: Goal: Ability to manage health-related needs will improve Spiritual consult in to help pt with AD.

## 2016-09-30 NOTE — Progress Notes (Signed)
   Ashley Rasmussen presented for a Lexiscan cardiolite today.  No immediate complications.  Stress imaging is pending at this time.  Reino Bellis, NP 09/30/2016, 10:07 AM

## 2016-09-30 NOTE — Plan of Care (Signed)
Problem: Spiritual Needs Goal: Ability to function at adequate level Outcome: Progressing Pt has a consult in to speak with spiritual care about  AD. Pt off the floor when caplain came by. Will retry later.

## 2016-09-30 NOTE — Progress Notes (Addendum)
PROGRESS NOTE    Ashley Rasmussen  HUT:[id number] DOB: 12/29/1950 DOA: 09/29/2016 PCP: Loura Pardon, MD   Outpatient Specialists:     Brief Narrative:  66 yo female c/o pressure/pain in the chest that starts from the the right shoulder radiating to the right upper neck and right side of the jaw line going down to the middle of the chest. It was really difficult to say what provokes pain palpitations or initially she experiences the pain followed by a rapid heart rate, but she describes her heart is racing and giving her sensation of fullness in the chest and in the throat. Few days ago she was having lunch with her daughter at the restaurant when she developed the same symptoms and felt like she was going to have a heart attack right at that place  She also complains of nasal congestion, pain in the left ear that has been going on for couple of weeks and during the last 2 or 3 days she started coughing, had low-grade fever at home, sore throat, myalgia. She has been in contact with her grandchildren who had flu   Assessment & Plan:   Principal Problem:   Chest pain Active Problems:   Hyperlipidemia   BREAST CANCER, HX OF   Fasting hyperglycemia   GERD (gastroesophageal reflux disease)   Viral upper respiratory illness   Otitis media of left ear   Atypical chest pain   Palpitations   Tachycardia   Chest pain with symptomatic tachyarrhythmia CE negative -continue tele -stress test results pending  Upper respiratory illness with Left sided otitis media Flu negative -start levaquin as symptoms >7 days -repeat chest x ray in AM -check procalcitonin  Hyperglycemia - Hgb A1C was 5.7% in March 2017 Will refresh Hgb A1C  Hyperlipidemia  Continue statin therapy  GERD On H2 blocker - Zantac at home      DVT prophylaxis:  SQ Heparin  Code Status: Full Code   Family Communication: Patient and family at bedside  Disposition Plan:     Consultants:        Subjective: Still with cough and fever  Objective: Vitals:   09/30/16 1004 09/30/16 1006 09/30/16 1007 09/30/16 1158  BP: 138/67 138/66 130/67 128/83  Pulse: (!) 113 (!) 108 (!) 109 (!) 104  Resp:      Temp:      TempSrc:      SpO2:      Weight:      Height:        Intake/Output Summary (Last 24 hours) at 09/30/16 1328 Last data filed at 09/30/16 0732  Gross per 24 hour  Intake              540 ml  Output                1 ml  Net              539 ml   Filed Weights   09/29/16 2000 09/30/16 0443  Weight: 87.1 kg (192 lb) 86.9 kg (191 lb 9.6 oz)    Examination:  General exam: Appears calm and comfortable  Respiratory system: no wheezing Cardiovascular system: S1 & S2 heard, RRR. No JVD, murmurs, rubs, gallops or clicks. No pedal edema. Gastrointestinal system: Abdomen is nondistended, soft and nontender. No organomegaly or masses felt. Normal bowel sounds heard. Central nervous system: Alert and oriented. No focal neurological deficits.     Data Reviewed: I have personally reviewed following labs and imaging  studies  CBC:  Recent Labs Lab 09/29/16 0150 09/29/16 0801  WBC 12.0* 12.3*  HGB 14.1 14.5  HCT 42.3 43.2  MCV 87.9 88.7  PLT 228 767   Basic Metabolic Panel:  Recent Labs Lab 09/29/16 0150 09/29/16 0801 09/29/16 1058  NA 138  --   --   K 3.7  --   --   CL 105  --   --   CO2 22  --   --   GLUCOSE 136*  --   --   BUN 10  --   --   CREATININE 1.09* 0.93  --   CALCIUM 9.6  --   --   MG  --   --  1.9   GFR: Estimated Creatinine Clearance: 66 mL/min (by C-G formula based on SCr of 0.93 mg/dL). Liver Function Tests: No results for input(s): AST, ALT, ALKPHOS, BILITOT, PROT, ALBUMIN in the last 168 hours. No results for input(s): LIPASE, AMYLASE in the last 168 hours. No results for input(s): AMMONIA in the last 168 hours. Coagulation Profile: No results for input(s): INR, PROTIME in the last 168 hours. Cardiac Enzymes:  Recent  Labs Lab 09/29/16 0801 09/29/16 1058 09/29/16 1409  TROPONINI <0.03 <0.03 <0.03   BNP (last 3 results) No results for input(s): PROBNP in the last 8760 hours. HbA1C:  Recent Labs  09/29/16 0801  HGBA1C 5.6   CBG: No results for input(s): GLUCAP in the last 168 hours. Lipid Profile: No results for input(s): CHOL, HDL, LDLCALC, TRIG, CHOLHDL, LDLDIRECT in the last 72 hours. Thyroid Function Tests:  Recent Labs  09/29/16 0623  TSH 1.122  T4TOTAL 8.0   Anemia Panel: No results for input(s): VITAMINB12, FOLATE, FERRITIN, TIBC, IRON, RETICCTPCT in the last 72 hours. Urine analysis:    Component Value Date/Time   COLORURINE YELLOW 09/29/2016 0503   APPEARANCEUR CLEAR 09/29/2016 0503   LABSPEC 1.017 09/29/2016 0503   PHURINE 6.0 09/29/2016 0503   GLUCOSEU NEGATIVE 09/29/2016 0503   HGBUR NEGATIVE 09/29/2016 0503   BILIRUBINUR NEGATIVE 09/29/2016 0503   BILIRUBINUR 3+ 09/09/2016 1218   KETONESUR NEGATIVE 09/29/2016 0503   PROTEINUR NEGATIVE 09/29/2016 0503   UROBILINOGEN 2.0 09/09/2016 1218   NITRITE NEGATIVE 09/29/2016 0503   LEUKOCYTESUR NEGATIVE 09/29/2016 0503    )No results found for this or any previous visit (from the past 240 hour(s)).    Anti-infectives    Start     Dose/Rate Route Frequency Ordered Stop   09/30/16 1245  levofloxacin (LEVAQUIN) tablet 750 mg     750 mg Oral Daily 09/30/16 1233         Radiology Studies: Dg Chest Portable 1 View  Result Date: 09/29/2016 CLINICAL DATA:  Right shoulder pain and chest pain tonight. EXAM: PORTABLE CHEST 1 VIEW COMPARISON:  None. FINDINGS: A single AP portable view of the chest demonstrates no focal airspace consolidation or alveolar edema. The lungs are grossly clear. There is no large effusion or pneumothorax. Cardiac and mediastinal contours appear unremarkable. IMPRESSION: No active disease. Electronically Signed   By: Andreas Newport M.D.   On: 09/29/2016 02:17        Scheduled Meds: . famotidine   20 mg Oral BID  . fluticasone  1 spray Each Nare Daily  . heparin  5,000 Units Subcutaneous Q8H  . levofloxacin  750 mg Oral Daily  . metoprolol tartrate  25 mg Oral BID   Continuous Infusions:   LOS: 0 days    Time spent: 25 min  [place], DO [facility] Pager [phone]  If 7PM-7AM, please contact night-coverage [email or web address] Password [email or web address] 09/30/2016, 1:28 PM

## 2016-10-01 ENCOUNTER — Observation Stay (HOSPITAL_COMMUNITY): Payer: PPO

## 2016-10-01 DIAGNOSIS — R079 Chest pain, unspecified: Secondary | ICD-10-CM

## 2016-10-01 DIAGNOSIS — R509 Fever, unspecified: Secondary | ICD-10-CM | POA: Diagnosis not present

## 2016-10-01 LAB — BASIC METABOLIC PANEL
Anion gap: 8 (ref 5–15)
BUN: 12 mg/dL (ref 6–20)
CALCIUM: 8.8 mg/dL — AB (ref 8.9–10.3)
CHLORIDE: 109 mmol/L (ref 101–111)
CO2: 22 mmol/L (ref 22–32)
Creatinine, Ser: 0.86 mg/dL (ref 0.44–1.00)
Glucose, Bld: 104 mg/dL — ABNORMAL HIGH (ref 65–99)
Potassium: 3.3 mmol/L — ABNORMAL LOW (ref 3.5–5.1)
SODIUM: 139 mmol/L (ref 135–145)

## 2016-10-01 LAB — CBC
HCT: 38 % (ref 36.0–46.0)
HEMOGLOBIN: 12.4 g/dL (ref 12.0–15.0)
MCH: 28.9 pg (ref 26.0–34.0)
MCHC: 32.6 g/dL (ref 30.0–36.0)
MCV: 88.6 fL (ref 78.0–100.0)
PLATELETS: 190 10*3/uL (ref 150–400)
RBC: 4.29 MIL/uL (ref 3.87–5.11)
RDW: 13.5 % (ref 11.5–15.5)
WBC: 15.4 10*3/uL — ABNORMAL HIGH (ref 4.0–10.5)

## 2016-10-01 MED ORDER — AZITHROMYCIN 500 MG PO TABS: 500.0000 mg | ORAL_TABLET | Freq: Every day | ORAL | 0 refills | Status: DC

## 2016-10-01 MED ORDER — GUAIFENESIN ER 600 MG PO TB12: 600.0000 mg | ORAL_TABLET | Freq: Two times a day (BID) | ORAL | 0 refills | Status: DC

## 2016-10-01 MED ORDER — METOPROLOL TARTRATE 25 MG PO TABS: 25.0000 mg | ORAL_TABLET | Freq: Two times a day (BID) | ORAL | 0 refills | Status: DC

## 2016-10-01 NOTE — Discharge Instructions (Signed)
Fever, Adult A fever is an increase in the bodys temperature. It is usually defined as a temperature of 100F (38C) or higher. Brief mild or moderate fevers generally have no long-term effects, and they often do not require treatment. Moderate or high fevers may make you feel uncomfortable and can sometimes be a sign of a serious illness or disease. The sweating that may occur with repeated or prolonged fever may also cause dehydration. Fever is confirmed by taking a temperature with a thermometer. A measured temperature can vary with:  Age.  Time of day.  Location of the thermometer:  Mouth (oral).  Rectum (rectal).  Ear (tympanic).  Underarm (axillary).  Forehead (temporal). Follow these instructions at home: Pay attention to any changes in your symptoms. Take these actions to help with your condition:  Take over-the counter and prescription medicines only as told by your health care provider. Follow the dosing instructions carefully.  If you were prescribed an antibiotic medicine, take it as told by your health care provider. Do not stop taking the antibiotic even if you start to feel better.  Rest as needed.  Drink enough fluid to keep your urine clear or pale yellow. This helps to prevent dehydration.  Sponge yourself or bathe with room-temperature water to help reduce your body temperature as needed. Do not use ice water.  Do not overbundle yourself in blankets or heavy clothes. Contact a health care provider if:  You vomit.  You cannot eat or drink without vomiting.  You have diarrhea.  You have pain when you urinate.  Your symptoms do not improve with treatment.  You develop new symptoms.  You develop excessive weakness. Get help right away if:  You have shortness of breath or have trouble breathing.  You are dizzy or you faint.  You are disoriented or confused.  You develop signs of dehydration, such as a dry mouth, decreased urination, or  paleness.  You develop severe pain in your abdomen.  You have persistent vomiting or diarrhea.  You develop a skin rash.  Your symptoms suddenly get worse. This information is not intended to replace advice given to you by your health care provider. Make sure you discuss any questions you have with your health care provider. Document Released: 01/06/2001 Document Revised: 12/19/2015 Document Reviewed: 09/06/2014 Elsevier Interactive Patient Education  2017 [employer]   Nonspecific Chest Pain Chest pain can be caused by many different conditions. There is always a chance that your pain could be related to something serious, such as a heart attack or a blood clot in your lungs. Chest pain can also be caused by conditions that are not life-threatening. If you have chest pain, it is very important to follow up with your health care provider. What are the causes? Causes of this condition include:  Heartburn.  Pneumonia or bronchitis.  Anxiety or stress.  Inflammation around your heart (pericarditis) or lung (pleuritis or pleurisy).  A blood clot in your lung.  A collapsed lung (pneumothorax). This can develop suddenly on its own (spontaneous pneumothorax) or from trauma to the chest.  Shingles infection (varicella-zoster virus).  Heart attack.  Damage to the bones, muscles, and cartilage that make up your chest wall. This can include:  Bruised bones due to injury.  Strained muscles or cartilage due to frequent or repeated coughing or overwork.  Fracture to one or more ribs.  Sore cartilage due to inflammation (costochondritis). What increases the risk? Risk factors for this condition may include:  Activities  that increase your risk for trauma or injury to your chest.  Respiratory infections or conditions that cause frequent coughing.  Medical conditions or overeating that can cause heartburn.  Heart disease or family history of heart disease.  Conditions or  health behaviors that increase your risk of developing a blood clot.  Having had chicken pox (varicella zoster). What are the signs or symptoms? Chest pain can feel like:  Burning or tingling on the surface of your chest or deep in your chest.  Crushing, pressure, aching, or squeezing pain.  Dull or sharp pain that is worse when you move, cough, or take a deep breath.  Pain that is also felt in your back, neck, shoulder, or arm, or pain that spreads to any of these areas. Your chest pain may come and go, or it may stay constant. How is this diagnosed? Lab tests or other studies may be needed to find the cause of your pain. Your health care provider may have you take a test called an ECG (electrocardiogram). An ECG records your heartbeat patterns at the time the test is performed. You may also have other tests, such as:  Transthoracic echocardiogram (TTE). In this test, sound waves are used to create a picture of the heart structures and to look at how blood flows through your heart.  Transesophageal echocardiogram (TEE).This is a more advanced imaging test that takes images from inside your body. It allows your health care provider to see your heart in finer detail.  Cardiac monitoring. This allows your health care provider to monitor your heart rate and rhythm in real time.  Holter monitor. This is a portable device that records your heartbeat and can help to diagnose abnormal heartbeats. It allows your health care provider to track your heart activity for several days, if needed.  Stress tests. These can be done through exercise or by taking medicine that makes your heart beat more quickly.  Blood tests.  Other imaging tests. How is this treated? Treatment depends on what is causing your chest pain. Treatment may include:  Medicines. These may include:  Acid blockers for heartburn.  Anti-inflammatory medicine.  Pain medicine for inflammatory conditions.  Antibiotic  medicine, if an infection is present.  Medicines to dissolve blood clots.  Medicines to treat coronary artery disease (CAD).  Supportive care for conditions that do not require medicines. This may include:  Resting.  Applying heat or cold packs to injured areas.  Limiting activities until pain decreases. Follow these instructions at home: Medicines   If you were prescribed an antibiotic, take it as told by your health care provider. Do not stop taking the antibiotic even if you start to feel better.  Take over-the-counter and prescription medicines only as told by your health care provider. Lifestyle   Do not use any products that contain nicotine or tobacco, such as cigarettes and e-cigarettes. If you need help quitting, ask your health care provider.  Do not drink alcohol.  Make lifestyle changes as directed by your health care provider. These may include:  Getting regular exercise. Ask your health care provider to suggest some activities that are safe for you.  Eating a heart-healthy diet. A registered dietitian can help you to learn healthy eating options.  Maintaining a healthy weight.  Managing diabetes, if necessary.  Reducing stress, such as with yoga or relaxation techniques. General instructions   Avoid any activities that bring on chest pain.  If heartburn is the cause for your chest pain, raise (  elevate) the head of your bed about 6 inches (15 cm) by putting blocks under the legs. Sleeping with more pillows does not effectively relieve heartburn because it only changes the position of your head.  Keep all follow-up visits as told by your health care provider. This is important. This includes any further testing if your chest pain does not go away. Contact a health care provider if:  Your chest pain does not go away.  You have a rash with blisters on your chest.  You have a fever.  You have chills. Get help right away if:  Your chest pain is  worse.  You have a cough that gets worse, or you cough up blood.  You have severe pain in your abdomen.  You have severe weakness.  You faint.  You have sudden, unexplained chest discomfort.  You have sudden, unexplained discomfort in your arms, back, neck, or jaw.  You have shortness of breath at any time.  You suddenly start to sweat, or your skin gets clammy.  You feel nauseous or you vomit.  You suddenly feel light-headed or dizzy.  Your heart begins to beat quickly, or it feels like it is skipping beats. These symptoms may represent a serious problem that is an emergency. Do not wait to see if the symptoms will go away. Get medical help right away. Call your local emergency services (911 in the [place] Do not drive yourself to the hospital. This information is not intended to replace advice given to you by your health care provider. Make sure you discuss any questions you have with your health care provider. Document Released: 04/22/2005 Document Revised: 04/06/2016 Document Reviewed: 04/06/2016 Elsevier Interactive Patient Education  2017 [employer].

## 2016-10-01 NOTE — Plan of Care (Signed)
Problem: Health Behavior/Discharge Planning: Goal: Ability to manage health-related needs will improve Outcome: Completed/Met Date Met: 10/01/16 Both the pt & her husband were educated on pt's diagnosis as well as treatment.   Problem: Pain Managment: Goal: General experience of comfort will improve Outcome: Adequate for Discharge Pt still having a headache however at discharge pt stated her headache was gone & this is the best she has felt in a while. Pt educated on what to take for pain & fever.   Problem: Cardiac: Goal: Ability to achieve and maintain adequate cardiovascular perfusion will improve Outcome: Completed/Met Date Met: 10/01/16 Pt walks well & did not experience sob nor cp upon ambulation.  Problem: Education: Goal: Understanding of cardiac disease, CV risk reduction, and recovery process will improve Outcome: Completed/Met Date Met: 10/01/16 Pt & husband educated on s/s of MI. Learning interpreted via teachback.

## 2016-10-01 NOTE — Discharge Summary (Signed)
Physician Discharge Summary  Ashley Rasmussen WUX:[id number] DOB: 09-Nov-1950 DOA: 09/29/2016  PCP: Loura Pardon, MD  Admit date: 09/29/2016 Discharge date: 10/01/2016  Time spent: 45 minutes  Recommendations for Outpatient Follow-up:  1. PCP in 1 week   Discharge Diagnoses:  Principal Problem:   Chest pain   URI/bronchitis   Hyperlipidemia   BREAST CANCER, HX OF   Fasting hyperglycemia   GERD (gastroesophageal reflux disease)   Viral upper respiratory illness   Otitis media of left ear   Atypical chest pain   Palpitations   Tachycardia   Discharge Condition: stable  Diet recommendation: heart healthy  Filed Weights   09/29/16 2000 09/30/16 0443 10/01/16 0600  Weight: 87.1 kg (192 lb) 86.9 kg (191 lb 9.6 oz) 87.2 kg (192 lb 3.2 oz)    History of present illness:  66 yo female c/o pressure/pain in the chest that starts from the the right shoulder radiating to the right upper neck and right side of the jaw line going down to the middle of the chest. It was really difficult to say what provokes pain palpitations or initially she experiences the pain followed by a rapid heart rate, but she describes her heart is racing and giving hersensation of fullness in the chest and in the throat. She also complains of nasal congestion,pain in the left ear that has been going on for couple of weeks and during the last 2 or 3 days she started coughing, had low-grade fever at home, sore throat, myalgia  Hospital Course:  Chest pain with symptomatic tachyarrhythmia -EKG and Troponins negative -Cardiology consulted, started on low dose Metoprolol, ECHo showed normal EF and Myoview was negative with no inducible ischemia -improved and stable now, discharged home in a stable condition  Upper respiratory illness with Left sided otitis media -Flu negative -CXR negative for acute findings -started on 5day course of Azithromycin for bacterial URI  Hyperglycemia -Hgb A1C was 5.7% in March  2017  Hyperlipidemia  Continue statin therapy  GERD On H2 blocker - Zantac at home  Procedures:  Myoview-low risk, no inducible ischemia  Consultations:  Cards  Discharge Exam: Vitals:   10/01/16 0600 10/01/16 0835  BP: 124/79 128/64  Pulse: 87 98  Resp:    Temp: 98.1 F (36.7 C)     General: AAOx3 Cardiovascular: S1S2/RRR Respiratory: CTAB  Discharge Instructions   Discharge Instructions    Diet - low sodium heart healthy    Complete by:  As directed    Increase activity slowly    Complete by:  As directed      Discharge Medication List as of 10/01/2016 10:34 AM    START taking these medications   Details  azithromycin (ZITHROMAX) 500 MG tablet Take 1 tablet (500 mg total) by mouth daily. For 3days, Starting Thu 10/01/2016, Normal    guaiFENesin (MUCINEX) 600 MG 12 hr tablet Take 1 tablet (600 mg total) by mouth 2 (two) times daily., Starting Thu 10/01/2016, Normal    metoprolol tartrate (LOPRESSOR) 25 MG tablet Take 1 tablet (25 mg total) by mouth 2 (two) times daily., Starting Thu 10/01/2016, Normal      CONTINUE these medications which have NOT CHANGED   Details  loratadine (CLARITIN) 10 MG tablet Take 10 mg by mouth daily as needed for allergies., Historical Med    Multiple Vitamin (MULTIVITAMIN) tablet Take 3 tablets by mouth daily. , Historical Med    ranitidine (ZANTAC) 150 MG tablet Take 1 tablet (150 mg total) by mouth 2 (  two) times daily., Starting Tue 08/25/2016, Normal       Allergies  Allergen Reactions  . Alendronate Sodium     REACTION: GI side effects  . Pravachol     myalgias  . Raloxifene     REACTION: breast tenderness and leg pain  . Simvastatin     REACTION: myalgias   Follow-up Information    Loura Pardon, MD. Go on 10/07/2016.   Specialties:  Family Medicine, Radiology Why:  hospital follow up @ 1215 arrive @ 12pm Contact information: [address] [place] [address] [phone]            The results of  significant diagnostics from this hospitalization (including imaging, microbiology, ancillary and laboratory) are listed below for reference.    Significant Diagnostic Studies: Dg Chest 2 View  Result Date: 10/01/2016 CLINICAL DATA:  Fever x1 day, history of breast cancer EXAM: CHEST  2 VIEW COMPARISON:  09/29/2016 FINDINGS: Increased interstitial markings with streaky infrahilar opacities in the bilateral lower lobes. No focal consolidation. No pleural effusion or pneumothorax. The heart is normal in size. Surgical clips in the right chest wall/axilla. IMPRESSION: No evidence of acute cardiopulmonary disease. Electronically Signed   By: Julian Hy M.D.   On: 10/01/2016 08:36   Nm Myocar Single W/spect W/wall Motion And Ef  Result Date: 09/30/2016  There was no ST segment deviation noted during stress.  No T wave inversion was noted during stress.  The study is normal.  This is a low risk study.  The left ventricular ejection fraction is hyperdynamic (>65%).  Normal pharmacologic nuclear stress test with no evidence of prior infarct or ischemia.   Dg Chest Portable 1 View  Result Date: 09/29/2016 CLINICAL DATA:  Right shoulder pain and chest pain tonight. EXAM: PORTABLE CHEST 1 VIEW COMPARISON:  None. FINDINGS: A single AP portable view of the chest demonstrates no focal airspace consolidation or alveolar edema. The lungs are grossly clear. There is no large effusion or pneumothorax. Cardiac and mediastinal contours appear unremarkable. IMPRESSION: No active disease. Electronically Signed   By: Andreas Newport M.D.   On: 09/29/2016 02:17    Microbiology: No results found for this or any previous visit (from the past 240 hour(s)).   Labs: Basic Metabolic Panel:  Recent Labs Lab 09/29/16 0150 09/29/16 0801 09/29/16 1058 10/01/16 0328  NA 138  --   --  139  K 3.7  --   --  3.3*  CL 105  --   --  109  CO2 22  --   --  22  GLUCOSE 136*  --   --  104*  BUN 10  --   --  12   CREATININE 1.09* 0.93  --  0.86  CALCIUM 9.6  --   --  8.8*  MG  --   --  1.9  --    Liver Function Tests: No results for input(s): AST, ALT, ALKPHOS, BILITOT, PROT, ALBUMIN in the last 168 hours. No results for input(s): LIPASE, AMYLASE in the last 168 hours. No results for input(s): AMMONIA in the last 168 hours. CBC:  Recent Labs Lab 09/29/16 0150 09/29/16 0801 10/01/16 0328  WBC 12.0* 12.3* 15.4*  HGB 14.1 14.5 12.4  HCT 42.3 43.2 38.0  MCV 87.9 88.7 88.6  PLT 228 194 190   Cardiac Enzymes:  Recent Labs Lab 09/29/16 0801 09/29/16 1058 09/29/16 1409  TROPONINI <0.03 <0.03 <0.03   BNP: BNP (last 3 results) No results  for input(s): BNP in the last 8760 hours.  ProBNP (last 3 results) No results for input(s): PROBNP in the last 8760 hours.  CBG: No results for input(s): GLUCAP in the last 168 hours.     SignedDomenic Polite MD.  [facility] 10/01/2016, 3:40 PM

## 2016-10-01 NOTE — Progress Notes (Signed)
Both pt & husband given discharge instructions & education. All of the pt's belongings were gathered & taken with the pt. All questions were answered. Pt's IV & telemetry monitor were removed & CCMD was notified of pt being discharged. Hoover Brunette, RN

## 2016-10-02 ENCOUNTER — Telehealth: Payer: Self-pay

## 2016-10-02 NOTE — Telephone Encounter (Signed)
Transition Care Management Follow-up Telephone Call    Date discharged? 10/01/2016  How have you been since you were released from the hospital? [place].   Any patient concerns? Patient wants to discuss long-term use of Metoprolol.    Items Reviewed:  Medications reviewed: Yes  Allergies reviewed: Yes  Dietary changes reviewed: Yes  Referrals reviewed: N/A   Functional Questionnaire:  Independent - I Dependent - D    Activities of Daily Living (ADLs):    Personal hygiene - I Dressing - I Eating - I Maintaining continence - I Transferring - I   Independent Activities of Daily Living (iADLs): Basic communication skills - I Transportation - I Meal preparation  - I Shopping - I Housework - I Managing medications - I  Managing personal finances - I   Confirmed importance and date/time of follow-up visits scheduled YES  Provider Appointment booked with PCP 10/07/16 @ 1215  Confirmed with patient if condition begins to worsen call PCP or go to the ER.  Patient was given the office number and encouraged to call back with question or concerns: YES

## 2016-10-07 ENCOUNTER — Ambulatory Visit (INDEPENDENT_AMBULATORY_CARE_PROVIDER_SITE_OTHER): Payer: PPO | Admitting: Family Medicine

## 2016-10-07 ENCOUNTER — Encounter: Payer: Self-pay | Admitting: Family Medicine

## 2016-10-07 VITALS — BP 132/76 | HR 81 | Temp 97.9°F | Ht 66.0 in | Wt 191.8 lb

## 2016-10-07 DIAGNOSIS — E784 Other hyperlipidemia: Secondary | ICD-10-CM

## 2016-10-07 DIAGNOSIS — R079 Chest pain, unspecified: Secondary | ICD-10-CM

## 2016-10-07 DIAGNOSIS — B9789 Other viral agents as the cause of diseases classified elsewhere: Secondary | ICD-10-CM | POA: Diagnosis not present

## 2016-10-07 DIAGNOSIS — K219 Gastro-esophageal reflux disease without esophagitis: Secondary | ICD-10-CM | POA: Diagnosis not present

## 2016-10-07 DIAGNOSIS — E7849 Other hyperlipidemia: Secondary | ICD-10-CM

## 2016-10-07 DIAGNOSIS — R000 Tachycardia, unspecified: Secondary | ICD-10-CM | POA: Diagnosis not present

## 2016-10-07 DIAGNOSIS — J069 Acute upper respiratory infection, unspecified: Secondary | ICD-10-CM

## 2016-10-07 NOTE — Patient Instructions (Addendum)
I'm glad you are feeling better  If your cough or chest pain return please let me know  Stay off the metoprolol if you do not have racing heart beat or palpitations   Stick with heart healthy diet for cholesterol Avoid red meat/ fried foods/ egg yolks/ fatty breakfast meats/ butter, cheese and high fat dairy/ and shellfish    Start exercising when you are up to it   We will see how your labs look in April      Fat and Cholesterol Restricted Diet Getting too much fat and cholesterol in your diet may cause health problems. Following this diet helps keep your fat and cholesterol at normal levels. This can keep you from getting sick. What types of fat should I choose?  Choose monosaturated and polyunsaturated fats. These are found in foods such as olive oil, canola oil, flaxseeds, walnuts, almonds, and seeds.  Eat more omega-3 fats. Good choices include salmon, mackerel, sardines, tuna, flaxseed oil, and ground flaxseeds.  Limit saturated fats. These are in animal products such as meats, butter, and cream. They can also be in plant products such as palm oil, palm kernel oil, and coconut oil.  Avoid foods with partially hydrogenated oils in them. These contain trans fats. Examples of foods that have trans fats are stick margarine, some tub margarines, cookies, crackers, and other baked goods. What general guidelines do I need to follow?  Check food labels. Look for the words "trans fat" and "saturated fat."  When preparing a meal:  Fill half of your plate with vegetables and green salads.  Fill one fourth of your plate with whole grains. Look for the word "whole" as the first word in the ingredient list.  Fill one fourth of your plate with lean protein foods.  Eat more foods that have fiber, like apples, carrots, beans, peas, and barley.  Eat more home-cooked foods. Eat less at restaurants and buffets.  Limit or avoid alcohol.  Limit foods high in starch and sugar.  Limit  fried foods.  Cook foods without frying them. Baking, boiling, grilling, and broiling are all great options.  Lose weight if you are overweight. Losing even a small amount of weight can help your overall health. It can also help prevent diseases such as diabetes and heart disease. What foods can I eat? Grains  Whole grains, such as whole wheat or whole grain breads, crackers, cereals, and pasta. Unsweetened oatmeal, bulgur, barley, quinoa, or brown rice. Corn or whole wheat flour tortillas. Vegetables  Fresh or frozen vegetables (raw, steamed, roasted, or grilled). Green salads. Fruits  All fresh, canned (in natural juice), or frozen fruits. Meat and Other Protein Products  Ground beef (85% or leaner), grass-fed beef, or beef trimmed of fat. Skinless chicken or [place]. Ground chicken or [place]. Pork trimmed of fat. All fish and seafood. Eggs. Dried beans, peas, or lentils. Unsalted nuts or seeds. Unsalted canned or dry beans. Dairy  Low-fat dairy products, such as skim or 1% milk, 2% or reduced-fat cheeses, low-fat ricotta or cottage cheese, or plain low-fat yogurt. Fats and Oils  Tub margarines without trans fats. Light or reduced-fat mayonnaise and salad dressings. Avocado. Olive, canola, sesame, or safflower oils. Natural peanut or almond butter (choose ones without added sugar and oil). The items listed above may not be a complete list of recommended foods or beverages. Contact your dietitian for more options.  What foods are not recommended? Grains  White bread. White pasta. White rice. Cornbread. Bagels, pastries, and  croissants. Crackers that contain trans fat. Vegetables  White potatoes. Corn. Creamed or fried vegetables. Vegetables in a cheese sauce. Fruits  Dried fruits. Canned fruit in light or heavy syrup. Fruit juice. Meat and Other Protein Products  Fatty cuts of meat. Ribs, chicken wings, bacon, sausage, bologna, salami, chitterlings, fatback, hot dogs, bratwurst, and  packaged luncheon meats. Liver and organ meats. Dairy  Whole or 2% milk, cream, half-and-half, and cream cheese. Whole milk cheeses. Whole-fat or sweetened yogurt. Full-fat cheeses. Nondairy creamers and whipped toppings. Processed cheese, cheese spreads, or cheese curds. Sweets and Desserts  Corn syrup, sugars, honey, and molasses. Candy. Jam and jelly. Syrup. Sweetened cereals. Cookies, pies, cakes, donuts, muffins, and ice cream. Fats and Oils  Butter, stick margarine, lard, shortening, ghee, or bacon fat. Coconut, palm kernel, or palm oils. Beverages  Alcohol. Sweetened drinks (such as sodas, lemonade, and fruit drinks or punches). The items listed above may not be a complete list of foods and beverages to avoid. Contact your dietitian for more information.  This information is not intended to replace advice given to you by your health care provider. Make sure you discuss any questions you have with your health care provider. Document Released: 01/12/2012 Document Revised: 03/19/2016 Document Reviewed: 10/12/2013 Elsevier Interactive Patient Education  2017 [employer].

## 2016-10-07 NOTE — Assessment & Plan Note (Signed)
Suspect caused by uri (along with tachycardia) Neg cardiac w/u  Also nl cxr  Now resolved after imp from [facility]  Reviewed hospital records, lab results and studies in detail

## 2016-10-07 NOTE — Progress Notes (Signed)
Pre visit review using our clinic review tool, if applicable. No additional management support is needed unless otherwise documented below in the visit note. 

## 2016-10-07 NOTE — Assessment & Plan Note (Signed)
Suspect this was cause of her cp and rapid HR  Much imp after supp care and azithromycin Reviewed hospital records, lab results and studies in detail   Re assuring exam  Will check labs next mo as planned

## 2016-10-07 NOTE — Assessment & Plan Note (Signed)
With neg cardiac w/u in hospital No recurrence after resolution of URI  Reviewed hospital records, lab results and studies in detail   She stopped metoprolol since symptoms resolved Stable HR now  Will continue to follow

## 2016-10-07 NOTE — Assessment & Plan Note (Signed)
Much improved with ranitidine

## 2016-10-07 NOTE — Progress Notes (Signed)
Subjective:    Patient ID: Ashley Rasmussen, female    DOB: 03-25-1951, 66 y.o.   MRN: [id number]  HPI Here for f/u of hospitalization from 3/6 to 10/01/16 for chest pain and palpitations   She presented with chest pressure and pain on the R side (rad to shoulder and back and jaw) as well as some rapid HR  She was also exp symptoms of URI at that time   Work up was re assuring EKG and troponin neg  Echo with nl EF  myoview neg  Cardiology consulted  Flu test and cxr neg   (was put emp on zpak for uri) - as wbc was slt elevated  Statin tx was continued for lipids   BP Readings from Last 3 Encounters:  10/07/16 132/76  10/01/16 128/64  09/09/16 136/80   Pulse Readings from Last 3 Encounters:  10/07/16 81  10/01/16 98  09/09/16 (!) 108   Metoprolol was started to help with palpitations  She stopped it because she does not feel her heart racing anymore    Dg Chest 2 View  Result Date: 10/01/2016 CLINICAL DATA:  Fever x1 day, history of breast cancer EXAM: CHEST  2 VIEW COMPARISON:  09/29/2016 FINDINGS: Increased interstitial markings with streaky infrahilar opacities in the bilateral lower lobes. No focal consolidation. No pleural effusion or pneumothorax. The heart is normal in size. Surgical clips in the right chest wall/axilla. IMPRESSION: No evidence of acute cardiopulmonary disease. Electronically Signed   By: Julian Hy M.D.   On: 10/01/2016 08:36   Nm Myocar Single W/spect W/wall Motion And Ef  Result Date: 09/30/2016  There was no ST segment deviation noted during stress.  No T wave inversion was noted during stress.  The study is normal.  This is a low risk study.  The left ventricular ejection fraction is hyperdynamic (>65%).  Normal pharmacologic nuclear stress test with no evidence of prior infarct or ischemia.   Dg Chest Portable 1 View  Result Date: 09/29/2016 CLINICAL DATA:  Right shoulder pain and chest pain tonight. EXAM: PORTABLE CHEST 1 VIEW COMPARISON:   None. FINDINGS: A single AP portable view of the chest demonstrates no focal airspace consolidation or alveolar edema. The lungs are grossly clear. There is no large effusion or pneumothorax. Cardiac and mediastinal contours appear unremarkable. IMPRESSION: No active disease. Electronically Signed   By: Andreas Newport M.D.   On: 09/29/2016 02:17    Results for orders placed or performed during the hospital encounter of 09/29/16  NM Myocar Single W/Spect W/Wall Motion And EF  Result Value Ref Range   Rest HR 98 bpm   Rest BP 129/68 mmHg   Percent HR 74 %   Exercise duration (min) 5 min   Estimated workload  METS   Peak HR  BPM   Peak BP  mmHg   MPHR 154 bpm   SSS 9    SRS 6    SDS 3    LHR 0.00    TID 1.09    LV sys vol 13 mL   LV dias vol 68 46 - 106 mL   Phase 1 name PRETEST    Stage 1 Name SUPINE    Stage 1 Time 00:19:45    Stage 1 Speed 0.0 mph   Stage 1 Grade 0.0 %   Stage 1 HR 102 bpm   Phase 2 Name Infusion    Stage 2 Name Infusion    Stage 2 Attribute Baseline  Stage 2 Time 00:00:01    Stage 2 Speed 0.0 mph   Stage 2 Grade 0.0 %   Stage 2 HR 102 bpm   Phase 3 Name Infusion    Stage 3 Name Infusion    Stage 3 Time 00:03:00    Stage 3 Speed 0.0 mph   Stage 3 Grade 0.0 %   Stage 3 HR 112 bpm   Stage 3 SBP 138 mmHg   Stage 3 DBP 66 mmHg   Phase 4 Name Infusion    Stage 4 Name Infusion    Stage 4 Time 00:02:01    Stage 4 Speed 0.0 mph   Stage 4 Grade 0.0 %   Stage 4 HR 107 bpm   Stage 4 SBP 130 mmHg   Stage 4 DBP 67 mmHg   Percent of predicted max HR  %  Basic metabolic panel  Result Value Ref Range   Sodium 138 135 - 145 mmol/L   Potassium 3.7 3.5 - 5.1 mmol/L   Chloride 105 101 - 111 mmol/L   CO2 22 22 - 32 mmol/L   Glucose, Bld 136 (H) 65 - 99 mg/dL   BUN 10 6 - 20 mg/dL   Creatinine, Ser 1.09 (H) 0.44 - 1.00 mg/dL   Calcium 9.6 8.9 - 10.3 mg/dL   GFR calc non Af Amer 52 (L) >60 mL/min   GFR calc Af Amer 60 (L) >60 mL/min   Anion gap 11 5 - 15    CBC  Result Value Ref Range   WBC 12.0 (H) 4.0 - 10.5 K/uL   RBC 4.81 3.87 - 5.11 MIL/uL   Hemoglobin 14.1 12.0 - 15.0 g/dL   HCT 42.3 36.0 - 46.0 %   MCV 87.9 78.0 - 100.0 fL   MCH 29.3 26.0 - 34.0 pg   MCHC 33.3 30.0 - 36.0 g/dL   RDW 13.1 11.5 - 15.5 %   Platelets 228 150 - 400 K/uL  D-dimer, quantitative (not at [facility])  Result Value Ref Range   D-Dimer, Quant 0.32 0.00 - 0.50 ug/mL-FEU  Rapid urine drug screen (hospital performed)  Result Value Ref Range   Opiates NONE DETECTED NONE DETECTED   Cocaine NONE DETECTED NONE DETECTED   Benzodiazepines NONE DETECTED NONE DETECTED   Amphetamines NONE DETECTED NONE DETECTED   Tetrahydrocannabinol NONE DETECTED NONE DETECTED   Barbiturates NONE DETECTED NONE DETECTED  Urinalysis, Routine w reflex microscopic  Result Value Ref Range   Color, Urine YELLOW YELLOW   APPearance CLEAR CLEAR   Specific Gravity, Urine 1.017 1.005 - 1.030   pH 6.0 5.0 - 8.0   Glucose, UA NEGATIVE NEGATIVE mg/dL   Hgb urine dipstick NEGATIVE NEGATIVE   Bilirubin Urine NEGATIVE NEGATIVE   Ketones, ur NEGATIVE NEGATIVE mg/dL   Protein, ur NEGATIVE NEGATIVE mg/dL   Nitrite NEGATIVE NEGATIVE   Leukocytes, UA NEGATIVE NEGATIVE  TSH  Result Value Ref Range   TSH 1.122 0.350 - 4.500 uIU/mL  T4  Result Value Ref Range   T4, Total 8.0 4.5 - 12.0 ug/dL  CBC  Result Value Ref Range   WBC 12.3 (H) 4.0 - 10.5 K/uL   RBC 4.87 3.87 - 5.11 MIL/uL   Hemoglobin 14.5 12.0 - 15.0 g/dL   HCT 43.2 36.0 - 46.0 %   MCV 88.7 78.0 - 100.0 fL   MCH 29.8 26.0 - 34.0 pg   MCHC 33.6 30.0 - 36.0 g/dL   RDW 13.3 11.5 - 15.5 %   Platelets 194 150 -  400 K/uL  Creatinine, serum  Result Value Ref Range   Creatinine, Ser 0.93 0.44 - 1.00 mg/dL   GFR calc non Af Amer >60 >60 mL/min   GFR calc Af Amer >60 >60 mL/min  Troponin I-serum (0, 3, 6 hours)  Result Value Ref Range   Troponin I <0.03 <0.03 ng/mL  Troponin I-serum (0, 3, 6 hours)  Result Value Ref Range   Troponin I  <0.03 <0.03 ng/mL  Troponin I-serum (0, 3, 6 hours)  Result Value Ref Range   Troponin I <0.03 <0.03 ng/mL  Influenza panel by PCR (type A & B)  Result Value Ref Range   Influenza A By PCR NEGATIVE NEGATIVE   Influenza B By PCR NEGATIVE NEGATIVE  Hemoglobin A1c  Result Value Ref Range   Hgb A1c MFr Bld 5.6 4.8 - 5.6 %   Mean Plasma Glucose 114 mg/dL  Magnesium  Result Value Ref Range   Magnesium 1.9 1.7 - 2.4 mg/dL  Procalcitonin - Baseline  Result Value Ref Range   Procalcitonin 0.38 ng/mL  CBC  Result Value Ref Range   WBC 15.4 (H) 4.0 - 10.5 K/uL   RBC 4.29 3.87 - 5.11 MIL/uL   Hemoglobin 12.4 12.0 - 15.0 g/dL   HCT 38.0 36.0 - 46.0 %   MCV 88.6 78.0 - 100.0 fL   MCH 28.9 26.0 - 34.0 pg   MCHC 32.6 30.0 - 36.0 g/dL   RDW 13.5 11.5 - 15.5 %   Platelets 190 150 - 400 K/uL  Basic metabolic panel  Result Value Ref Range   Sodium 139 135 - 145 mmol/L   Potassium 3.3 (L) 3.5 - 5.1 mmol/L   Chloride 109 101 - 111 mmol/L   CO2 22 22 - 32 mmol/L   Glucose, Bld 104 (H) 65 - 99 mg/dL   BUN 12 6 - 20 mg/dL   Creatinine, Ser 0.86 0.44 - 1.00 mg/dL   Calcium 8.8 (L) 8.9 - 10.3 mg/dL   GFR calc non Af Amer >60 >60 mL/min   GFR calc Af Amer >60 >60 mL/min   Anion gap 8 5 - 15  I-stat troponin, ED  Result Value Ref Range   Troponin i, poc 0.00 0.00 - 0.08 ng/mL   Comment 3             In retrospect she thinks the uri made her chest hurt and her heart race   Lab Results  Component Value Date   CHOL 252 (H) 09/27/2015   HDL 41.60 09/27/2015   LDLCALC 158 (H) 04/06/2008   LDLDIRECT 177.0 09/27/2015   TRIG 237.0 (H) 09/27/2015   CHOLHDL 6 09/27/2015   Would consider chol med if this does not improve at her labs in April   Feels much better  Taking mucinex only at night  Her cough is much improved/almost gone  Drinking water  No fever or sob or wheezing  Energy level is not quite back to normal but getting better   Eating better also - heart healthy diet  Wt Readings  from Last 3 Encounters:  10/07/16 191 lb 12 oz (87 kg)  10/01/16 192 lb 3.2 oz (87.2 kg)  09/09/16 193 lb 8 oz (87.8 kg)    Patient Active Problem List   Diagnosis Date Noted  . Tachycardia   . Chest pain 09/29/2016  . Viral upper respiratory illness 09/29/2016  . Hearing loss in left ear 10/02/2015  . Left foot pain 09/27/2015  . GERD (gastroesophageal reflux  disease) 05/08/2015  . Obesity 12/13/2012  . Encounter for routine gynecological examination 08/16/2012  . Colon cancer screening 08/16/2012  . Fasting hyperglycemia 08/16/2012  . Routine general medical examination at a health care facility 08/08/2012  . Uterine prolapse 07/06/2012  . BACK PAIN, LUMBAR, WITH RADICULOPATHY 08/15/2010  . Hyperlipidemia 04/13/2008  . Osteoporosis 04/06/2008  . Allergic rhinitis 04/15/2007  . BREAST CANCER, HX OF 04/15/2007   Past Medical History:  Diagnosis Date  . Allergic rhinitis, cause unspecified   . HLD (hyperlipidemia)   . Osteopenia   . Osteoporosis, unspecified   . Personal history of malignant neoplasm of breast    needs yearly mammogram and MRI every 2  . Thoracic or lumbosacral neuritis or radiculitis, unspecified    Past Surgical History:  Procedure Laterality Date  . Breast cancer excision    . LATISSIMUS FLAP TO BREAST     due to radiation burn   Social History  Substance Use Topics  . Smoking status: Former Research scientist (life sciences)  . Smokeless tobacco: Never Used     Comment: quit over 58yrs ago  . Alcohol use No   Family History  Problem Relation Age of Onset  . Other Mother     B-12 deficiency  . Breast cancer Mother   . Melanoma Mother   . Myelodysplastic syndrome Mother   . Osteoporosis Mother   . Other Sister     B-12 deficiency  . Breast cancer Sister   . Osteoporosis Sister   . Breast cancer      Multiple cousins   Allergies  Allergen Reactions  . Alendronate Sodium     REACTION: GI side effects  . Pravachol     myalgias  . Raloxifene     REACTION:  breast tenderness and leg pain  . Simvastatin     REACTION: myalgias   Current Outpatient Prescriptions on File Prior to Visit  Medication Sig Dispense Refill  . guaiFENesin (MUCINEX) 600 MG 12 hr tablet Take 1 tablet (600 mg total) by mouth 2 (two) times daily. 6 tablet 0  . loratadine (CLARITIN) 10 MG tablet Take 10 mg by mouth daily as needed for allergies.    . Multiple Vitamin (MULTIVITAMIN) tablet Take 3 tablets by mouth daily.     . ranitidine (ZANTAC) 150 MG tablet Take 1 tablet (150 mg total) by mouth 2 (two) times daily. 60 tablet 2   No current facility-administered medications on file prior to visit.     Review of Systems Review of Systems  Constitutional: Negative for fever, appetite change,  and unexpected weight change. pos for improving generalized fatigue  Eyes: Negative for pain and visual disturbance.  Respiratory: Negative for wheeze and shortness of breath.  pos for mild improving residual cough Cardiovascular: Negative for cp or palpitations    Gastrointestinal: Negative for nausea, diarrhea and constipation.  Genitourinary: Negative for urgency and frequency.  Skin: Negative for pallor or rash   Neurological: Negative for weakness, light-headedness, numbness and headaches.  Hematological: Negative for adenopathy. Does not bruise/bleed easily.  Psychiatric/Behavioral: Negative for dysphoric mood. The patient is not nervous/anxious.         Objective:   Physical Exam  Constitutional: She appears well-developed and well-nourished. No distress.  overwt and well app  HENT:  Head: Normocephalic and atraumatic.  Right Ear: External ear normal.  Left Ear: External ear normal.  Mouth/Throat: Oropharynx is clear and moist.  Nares are boggy  Eyes: Conjunctivae and EOM are normal. Pupils are equal, round,  and reactive to light. Right eye exhibits no discharge. Left eye exhibits no discharge.  Neck: Normal range of motion. Neck supple. No JVD present. Carotid bruit is  not present. No thyromegaly present.  Cardiovascular: Normal rate, regular rhythm, normal heart sounds and intact distal pulses.  Exam reveals no gallop.   Pulmonary/Chest: Effort normal and breath sounds normal. No respiratory distress. She has no wheezes. She has no rales. She exhibits no tenderness.  No crackles  No rales or rhonchi  Good air exch  Abdominal: Soft. Bowel sounds are normal. She exhibits no distension, no abdominal bruit and no mass. There is no tenderness.  Musculoskeletal: She exhibits no edema or tenderness.  No edema or palp cords in legs  Lymphadenopathy:    She has no cervical adenopathy.  Neurological: She is alert. She has normal reflexes. No cranial nerve deficit.  Skin: Skin is warm and dry. No rash noted. No pallor.  Psychiatric: She has a normal mood and affect.          Assessment & Plan:   Problem List Items Addressed This Visit      Respiratory   Viral upper respiratory illness - Primary    Suspect this was cause of her cp and rapid HR  Much imp after supp care and azithromycin Reviewed hospital records, lab results and studies in detail   Re assuring exam  Will check labs next mo as planned          Digestive   GERD (gastroesophageal reflux disease)    Much improved with ranitidine         Other   Chest pain    Suspect caused by uri (along with tachycardia) Neg cardiac w/u  Also nl cxr  Now resolved after imp from [facility]  Reviewed hospital records, lab results and studies in detail        Hyperlipidemia    Recent nl cardiac w/u  Disc goals for lipids and reasons to control them Rev labs with pt Rev low sat fat diet in detail Will re check this in April as planned Disc imp of diet and handout given       Tachycardia    With neg cardiac w/u in hospital No recurrence after resolution of URI  Reviewed hospital records, lab results and studies in detail   She stopped metoprolol since symptoms resolved Stable HR now  Will  continue to follow

## 2016-10-07 NOTE — Assessment & Plan Note (Signed)
Recent nl cardiac w/u  Disc goals for lipids and reasons to control them Rev labs with pt Rev low sat fat diet in detail Will re check this in April as planned Disc imp of diet and handout given

## 2016-11-02 ENCOUNTER — Telehealth: Payer: Self-pay | Admitting: *Deleted

## 2016-11-02 NOTE — Telephone Encounter (Signed)
Pt said this happened on Friday and Saturday is when she flushed her eyes out, pt will get OTC eye drops and update [place] if any vision/ irritation problems but she said for now she seems okay

## 2016-11-02 NOTE — Telephone Encounter (Signed)
She did the correct thing to flush her eyes - she can also use artificial tears drops  If irritation persists today or any vision problems-please alert [place] (go to ED if symptoms are severe)  Use caution not to do this again

## 2016-11-02 NOTE — Telephone Encounter (Signed)
Pt left vm on triage, stating she accidentally placed ear drops in her eyes. She states it was ciprodex, which is not currently on her medication list. She has since flushed her eye repeatedly, but wants to know if there is something additional she is needing to do. pls advise

## 2016-11-09 ENCOUNTER — Encounter: Payer: PPO | Admitting: Family Medicine

## 2016-11-24 ENCOUNTER — Encounter: Payer: Self-pay | Admitting: Family Medicine

## 2016-11-24 ENCOUNTER — Ambulatory Visit (INDEPENDENT_AMBULATORY_CARE_PROVIDER_SITE_OTHER): Payer: PPO | Admitting: Family Medicine

## 2016-11-24 VITALS — BP 126/78 | HR 94 | Temp 97.8°F | Ht 66.25 in | Wt 192.5 lb

## 2016-11-24 DIAGNOSIS — Z808 Family history of malignant neoplasm of other organs or systems: Secondary | ICD-10-CM | POA: Insufficient documentation

## 2016-11-24 DIAGNOSIS — K219 Gastro-esophageal reflux disease without esophagitis: Secondary | ICD-10-CM

## 2016-11-24 DIAGNOSIS — E6609 Other obesity due to excess calories: Secondary | ICD-10-CM

## 2016-11-24 DIAGNOSIS — R7301 Impaired fasting glucose: Secondary | ICD-10-CM | POA: Diagnosis not present

## 2016-11-24 DIAGNOSIS — Z23 Encounter for immunization: Secondary | ICD-10-CM

## 2016-11-24 DIAGNOSIS — Z853 Personal history of malignant neoplasm of breast: Secondary | ICD-10-CM | POA: Diagnosis not present

## 2016-11-24 DIAGNOSIS — M810 Age-related osteoporosis without current pathological fracture: Secondary | ICD-10-CM | POA: Diagnosis not present

## 2016-11-24 DIAGNOSIS — E784 Other hyperlipidemia: Secondary | ICD-10-CM

## 2016-11-24 DIAGNOSIS — E7849 Other hyperlipidemia: Secondary | ICD-10-CM

## 2016-11-24 DIAGNOSIS — Z1211 Encounter for screening for malignant neoplasm of colon: Secondary | ICD-10-CM | POA: Diagnosis not present

## 2016-11-24 DIAGNOSIS — Z6830 Body mass index (BMI) 30.0-30.9, adult: Secondary | ICD-10-CM | POA: Diagnosis not present

## 2016-11-24 DIAGNOSIS — Z0000 Encounter for general adult medical examination without abnormal findings: Secondary | ICD-10-CM | POA: Diagnosis not present

## 2016-11-24 LAB — COMPREHENSIVE METABOLIC PANEL
ALBUMIN: 4.3 g/dL (ref 3.5–5.2)
ALK PHOS: 99 U/L (ref 39–117)
ALT: 12 U/L (ref 0–35)
AST: 13 U/L (ref 0–37)
BILIRUBIN TOTAL: 0.5 mg/dL (ref 0.2–1.2)
BUN: 14 mg/dL (ref 6–23)
CALCIUM: 10 mg/dL (ref 8.4–10.5)
CO2: 29 mEq/L (ref 19–32)
CREATININE: 0.9 mg/dL (ref 0.40–1.20)
Chloride: 105 mEq/L (ref 96–112)
GFR: 66.48 mL/min (ref >60.00)
Glucose, Bld: 109 mg/dL — ABNORMAL HIGH (ref 70–99)
Potassium: 4.1 mEq/L (ref 3.5–5.1)
Sodium: 141 mEq/L (ref 135–145)
TOTAL PROTEIN: 7.1 g/dL (ref 6.0–8.3)

## 2016-11-24 LAB — LDL CHOLESTEROL, DIRECT: Direct LDL: 153 mg/dL

## 2016-11-24 LAB — CBC WITH DIFFERENTIAL/PLATELET
BASOS PCT: 0.5 % (ref 0.0–3.0)
Basophils Absolute: 0 10*3/uL (ref 0.0–0.1)
EOS ABS: 0.1 10*3/uL (ref 0.0–0.7)
EOS PCT: 1.6 % (ref 0.0–5.0)
HEMATOCRIT: 42.6 % (ref 36.0–46.0)
HEMOGLOBIN: 14.3 g/dL (ref 12.0–15.0)
LYMPHS PCT: 26.6 % (ref 12.0–46.0)
Lymphs Abs: 2 10*3/uL (ref 0.7–4.0)
MCHC: 33.6 g/dL (ref 30.0–36.0)
MCV: 88.5 fl (ref 78.0–100.0)
MONOS PCT: 7.7 % (ref 3.0–12.0)
Monocytes Absolute: 0.6 10*3/uL (ref 0.1–1.0)
NEUTROS ABS: 4.8 10*3/uL (ref 1.4–7.7)
Neutrophils Relative %: 63.6 % (ref 43.0–77.0)
PLATELETS: 259 10*3/uL (ref 150.0–400.0)
RBC: 4.81 Mil/uL (ref 3.87–5.11)
RDW: 14 % (ref 11.5–15.5)
WBC: 7.6 10*3/uL (ref 4.0–10.5)

## 2016-11-24 LAB — LIPID PANEL
CHOLESTEROL: 221 mg/dL — AB (ref 0–200)
HDL: 45.9 mg/dL (ref >39.00)
NONHDL: 175.37
Total CHOL/HDL Ratio: 5
Triglycerides: 203 mg/dL — ABNORMAL HIGH (ref 0.0–149.0)
VLDL: 40.6 mg/dL — ABNORMAL HIGH (ref 0.0–40.0)

## 2016-11-24 LAB — TSH: TSH: 1.78 u[IU]/mL (ref 0.35–4.50)

## 2016-11-24 MED ORDER — TETANUS-DIPHTH-ACELL PERTUSSIS 5-2.5-18.5 LF-MCG/0.5 IM SUSP
0.5000 mL | Freq: Once | INTRAMUSCULAR | Status: AC
  Administered 2016-11-24: 0.5 mL via INTRAMUSCULAR

## 2016-11-24 MED ORDER — RANITIDINE HCL 150 MG PO TABS: 150.0000 mg | ORAL_TABLET | Freq: Two times a day (BID) | ORAL | 11 refills | Status: DC

## 2016-11-24 MED ORDER — PNEUMOCOCCAL VAC POLYVALENT 25 MCG/0.5ML IJ INJ
0.5000 mL | INJECTION | Freq: Once | INTRAMUSCULAR | Status: AC
Start: 2016-11-24 — End: 2016-11-24
  Administered 2016-11-24: 0.5 mL via INTRAMUSCULAR

## 2016-11-24 NOTE — Assessment & Plan Note (Signed)
Reviewed health habits including diet and exercise and skin cancer prevention Reviewed appropriate screening tests for age  Also reviewed health mt list, fam hx and immunization status , as well as social and family history   See HPI Will schedule AMW visit when able  Referred for screening colonoscopy  utd mammograms  PPSV23 and Tdap vaccines today  She will check into coverage of [employer] today  Enc exercise and wt loss

## 2016-11-24 NOTE — Assessment & Plan Note (Signed)
Refer for first screening colonoscopy

## 2016-11-24 NOTE — Assessment & Plan Note (Signed)
Refer to dermatology for skin screening   Many sks  Uses sun protection always

## 2016-11-24 NOTE — Assessment & Plan Note (Signed)
No change in exam  Hx of surgery/chemo and radiation Getting yearly mammograms Cannot afford genetic testing

## 2016-11-24 NOTE — Assessment & Plan Note (Signed)
Last dexa osteopenia Some kyphosis  No falls or fx On ca and D  Will plan re check next year (since she is going to do some other referrals today)  Disc need for calcium/ vitamin D/ wt bearing exercise and bone density test every 2 y to monitor Disc safety/ fracture risk in detail

## 2016-11-24 NOTE — Progress Notes (Signed)
Subjective:    Patient ID: Ashley Rasmussen, female    DOB: 11-Mar-1951, 66 y.o.   MRN: [id number]  HPI  Here for health maintenance exam and to review chronic medical problems    A little diarrhea today- ate too many strawberries yesterday  No fever or other symptoms  A little tired today   Has not had AMW yet   Wt Readings from Last 3 Encounters:  11/24/16 192 lb 8 oz (87.3 kg)  10/07/16 191 lb 12 oz (87 kg)  10/01/16 192 lb 3.2 oz (87.2 kg)  trying to eat a better diet-more fruits and veggies / occ sweets  Walking lately-walks the dog every day -about 25-30 minutes at least (getting outdoors also) bmi 30.8  Colon cancer screening -never got around to it  Needs to get back on the schedule   Had her eye exam- it was ok /re assuring/ has a px for glasses   Tetanus shot 06 Wants to get that today   Due for PPSV23- wants to get that today   Flu shot 10/17  Mammogram 10/17 negative Strong family history of breast cancer   (cannot afford genetic consult or testing unfortunately)  Personal hx of breast cancer -s/p surgery/ radiation and tomoxifen  Self breast exam -no lumps /changes   Family hx of melanoma - has not been to see dermatology    dexa 9/09-osteopenia  Taking her ca and D  No falls or fractures  She is careful  Will consider dexa at next PE  Pap nl 3/17 with neg HPV  No symptoms or new sexual partners    Zoster vaccine - would consider Shingrix if covered   Hx of hyperlipidemia Lab Results  Component Value Date   CHOL 252 (H) 09/27/2015   CHOL 206 (H) 08/09/2012   CHOL 218 (H) 03/06/2010   Lab Results  Component Value Date   HDL 41.60 09/27/2015   HDL 41.50 08/09/2012   HDL 46.70 03/06/2010   Lab Results  Component Value Date   LDLCALC 158 (H) 04/06/2008   Lab Results  Component Value Date   TRIG 237.0 (H) 09/27/2015   TRIG 131.0 08/09/2012   TRIG 121.0 03/06/2010   Lab Results  Component Value Date   CHOLHDL 6 09/27/2015   CHOLHDL  5 08/09/2012   CHOLHDL 5 03/06/2010   Lab Results  Component Value Date   LDLDIRECT 177.0 09/27/2015   LDLDIRECT 140.9 08/09/2012   LDLDIRECT 158.5 03/06/2010  due for a cholesterol check    Hx of hyperglycemia Lab Results  Component Value Date   HGBA1C 5.6 09/29/2016       Chemistry      Component Value Date/Time   NA 139 10/01/2016 0328   K 3.3 (L) 10/01/2016 0328   CL 109 10/01/2016 0328   CO2 22 10/01/2016 0328   BUN 12 10/01/2016 0328   CREATININE 0.86 10/01/2016 0328      Component Value Date/Time   CALCIUM 8.8 (L) 10/01/2016 0328   ALKPHOS 86 09/27/2015 1225   AST 16 09/27/2015 1225   ALT 17 09/27/2015 1225   BILITOT 0.6 09/27/2015 1225     glucose is 104 Lab Results  Component Value Date   TSH 1.122 09/29/2016   Lab Results  Component Value Date   WBC 15.4 (H) 10/01/2016   HGB 12.4 10/01/2016   HCT 38.0 10/01/2016   MCV 88.6 10/01/2016   PLT 190 10/01/2016     Wbc has been increasing (was  prev in the 12s)-sick at that time /bad uri   We do not have a diff or path report  She was sick with uri at that time  Review of Systems Review of Systems  Constitutional: Negative for fever, appetite change, fatigue and unexpected weight change.  Eyes: Negative for pain and visual disturbance.  Respiratory: Negative for cough and shortness of breath.   Cardiovascular: Negative for cp or palpitations    Gastrointestinal: Negative for nausea,  and constipation. mild diarrhea today from eating fruit  Genitourinary: Negative for urgency and frequency.  Skin: Negative for pallor or rash   Neurological: Negative for weakness, light-headedness, numbness and headaches.  Hematological: Negative for adenopathy. Does not bruise/bleed easily.  Psychiatric/Behavioral: Negative for dysphoric mood. The patient is not nervous/anxious.         Objective:   Physical Exam  Constitutional: She appears well-developed and well-nourished. No distress.  obese and well  appearing   HENT:  Head: Normocephalic and atraumatic.  Right Ear: External ear normal.  Left Ear: External ear normal.  Mouth/Throat: Oropharynx is clear and moist.  Eyes: Conjunctivae and EOM are normal. Pupils are equal, round, and reactive to light. No scleral icterus.  Neck: Normal range of motion. Neck supple. No JVD present. Carotid bruit is not present. No thyromegaly present.  Cardiovascular: Normal rate, regular rhythm, normal heart sounds and intact distal pulses.  Exam reveals no gallop.   Pulmonary/Chest: Effort normal and breath sounds normal. No respiratory distress. She has no wheezes. She exhibits no tenderness.  Abdominal: Soft. Bowel sounds are normal. She exhibits no distension, no abdominal bruit and no mass. There is no tenderness.  Genitourinary: No breast swelling, tenderness, discharge or bleeding.  Genitourinary Comments: Breast exam: No mass, nodules, thickening, tenderness, bulging, retraction, inflamation, nipple discharge or skin changes noted.  No axillary or clavicular LA.    R breast- unchanged surgical changes with skin flap and scar tissue   Musculoskeletal: Normal range of motion. She exhibits no edema or tenderness.  Mild kyphosis   Lymphadenopathy:    She has no cervical adenopathy.  Neurological: She is alert. She has normal reflexes. No cranial nerve deficit. She exhibits normal muscle tone. Coordination normal.  Skin: Skin is warm and dry. No rash noted. No erythema. No pallor.  Solar lentigines diffusely  sks diffusely  Psychiatric: She has a normal mood and affect.          Assessment & Plan:   Problem List Items Addressed This Visit      Digestive   GERD (gastroesophageal reflux disease)    Doing much better with ranitidine Disc GERD diet  Also working on wt loss      Relevant Medications   ranitidine (ZANTAC) 150 MG tablet     Musculoskeletal and Integument   Osteoporosis    Last dexa osteopenia Some kyphosis  No falls or  fx On ca and D  Will plan re check next year (since she is going to do some other referrals today)  Disc need for calcium/ vitamin D/ wt bearing exercise and bone density test every 2 y to monitor Disc safety/ fracture risk in detail          Other   BREAST CANCER, HX OF    No change in exam  Hx of surgery/chemo and radiation Getting yearly mammograms Cannot afford genetic testing      Colon cancer screening    Refer for first screening colonoscopy      Relevant  Orders   Ambulatory referral to Gastroenterology   Family history of melanoma    Refer to dermatology for skin screening   Many sks  Uses sun protection always       Relevant Orders   Ambulatory referral to Dermatology   Fasting hyperglycemia    Lab Results  Component Value Date   HGBA1C 5.6 09/29/2016   This is stable  disc imp of low glycemic diet and wt loss to prevent DM2       Hyperlipidemia    Disc goals for lipids and reasons to control them Rev labs with pt (from a year ago) Lipid panel today Rev low sat fat diet in detail       Obesity    Discussed how this problem influences overall health and the risks it imposes  Reviewed plan for weight loss with lower calorie diet (via better food choices and also portion control or program like weight watchers) and exercise building up to or more than 30 minutes 5 days per week including some aerobic activity   Doing better with exercise  Disc dec portions and sweets        Routine general medical examination at a health care facility - Primary    Reviewed health habits including diet and exercise and skin cancer prevention Reviewed appropriate screening tests for age  Also reviewed health mt list, fam hx and immunization status , as well as social and family history   See HPI Will schedule AMW visit when able  Referred for screening colonoscopy  utd mammograms  PPSV23 and Tdap vaccines today  She will check into coverage of [facility] [facility]  today  Enc exercise and wt loss        Relevant Orders   CBC with Differential/Platelet   Comprehensive metabolic panel   Lipid panel   TSH

## 2016-11-24 NOTE — Patient Instructions (Addendum)
We will refer you for a colonoscopy and dermatologist   There is a new shingles vaccine - called Shingrix  If you are interested in a shingles/zoster vaccine - call your insurance to check on coverage,( you should not get it within 1 month of other vaccines) , then call us for a prescription  for it to take to a pharmacy that gives the shot , or make a nurse visit to get it here depending on your coverage  I do recommend getting immunized   Tetanus (Tdap) vaccine today  Also pneumonia vaccine (PPSV23)   Labs today   We will schedule your AMW (medicare interview) at check out

## 2016-11-24 NOTE — Assessment & Plan Note (Signed)
Disc goals for lipids and reasons to control them Rev labs with pt (from a year ago) Lipid panel today Rev low sat fat diet in detail

## 2016-11-24 NOTE — Progress Notes (Signed)
Pre visit review using our clinic review tool, if applicable. No additional management support is needed unless otherwise documented below in the visit note. 

## 2016-11-24 NOTE — Assessment & Plan Note (Signed)
Discussed how this problem influences overall health and the risks it imposes  Reviewed plan for weight loss with lower calorie diet (via better food choices and also portion control or program like weight watchers) and exercise building up to or more than 30 minutes 5 days per week including some aerobic activity   Doing better with exercise  Disc dec portions and sweets

## 2016-11-24 NOTE — Assessment & Plan Note (Signed)
Doing much better with ranitidine Disc GERD diet  Also working on wt loss

## 2016-11-24 NOTE — Assessment & Plan Note (Signed)
Lab Results  Component Value Date   HGBA1C 5.6 09/29/2016   This is stable  disc imp of low glycemic diet and wt loss to prevent DM2

## 2016-11-30 ENCOUNTER — Other Ambulatory Visit: Payer: Self-pay

## 2016-11-30 DIAGNOSIS — Z1211 Encounter for screening for malignant neoplasm of colon: Secondary | ICD-10-CM

## 2016-11-30 NOTE — Progress Notes (Signed)
Gastroenterology Pre-Procedure Review  Request Date: 01/19/17 Requesting Physician: Dr. Allen Norris  PATIENT REVIEW QUESTIONS: The patient responded to the following health history questions as indicated:    1. Are you having any GI issues? no 2. Do you have a personal history of Polyps? no 3. Do you have a family history of Colon Cancer or Polyps? no 4. Diabetes Mellitus? no 5. Joint replacements in the past 12 months?no 6. Major health problems in the past 3 months?yes (03/06 Pt had ER visit for tachycardia but has resolved) 7. Any artificial heart valves, MVP, or defibrillator?no    MEDICATIONS & ALLERGIES:    Patient reports the following regarding taking any anticoagulation/antiplatelet therapy:   Plavix, Coumadin, Eliquis, Xarelto, Lovenox, Pradaxa, Brilinta, or Effient? no Aspirin? no  Patient confirms/reports the following medications:  Current Outpatient Prescriptions  Medication Sig Dispense Refill  . fluticasone (FLONASE) 50 MCG/ACT nasal spray Place into both nostrils daily as needed for allergies or rhinitis.    Marland Kitchen guaiFENesin (MUCINEX) 600 MG 12 hr tablet Take 1 tablet (600 mg total) by mouth 2 (two) times daily. 6 tablet 0  . loratadine (CLARITIN) 10 MG tablet Take 10 mg by mouth daily as needed for allergies.    . Multiple Vitamin (MULTIVITAMIN) tablet Take 3 tablets by mouth daily.     . ranitidine (ZANTAC) 150 MG tablet Take 1 tablet (150 mg total) by mouth 2 (two) times daily. 60 tablet 11   No current facility-administered medications for this visit.     Patient confirms/reports the following allergies:  Allergies  Allergen Reactions  . Alendronate Sodium     REACTION: GI side effects  . Pravachol     myalgias  . Raloxifene     REACTION: breast tenderness and leg pain  . Simvastatin     REACTION: myalgias    No orders of the defined types were placed in this encounter.   AUTHORIZATION INFORMATION Primary Insurance: 1D#: Group #:  Secondary  Insurance: 1D#: Group #:  SCHEDULE INFORMATION: Date:  Time: Location:

## 2016-12-01 ENCOUNTER — Telehealth: Payer: Self-pay | Admitting: Gastroenterology

## 2016-12-01 NOTE — Telephone Encounter (Signed)
12/01/16 Faxed Prior Auth form to [facility] for Screening Colonoscopy [phone] / [id number]

## 2017-01-06 ENCOUNTER — Telehealth: Payer: Self-pay

## 2017-01-06 NOTE — Telephone Encounter (Signed)
Attempted to reach patient to reschedule AWV appt. No response on home phone. Mobile phone number is not valid.

## 2017-01-11 ENCOUNTER — Ambulatory Visit: Payer: PPO

## 2017-01-11 NOTE — Telephone Encounter (Signed)
Spoke with patient. Will schedule same day 2019 AWV and CPE to remain on same cycle. Pt verbally agreed.

## 2017-01-19 ENCOUNTER — Ambulatory Visit: Admission: RE | Admit: 2017-01-19 | Payer: PPO | Source: Ambulatory Visit | Admitting: Gastroenterology

## 2017-01-19 ENCOUNTER — Encounter: Admission: RE | Payer: Self-pay | Source: Ambulatory Visit

## 2017-01-19 SURGERY — COLONOSCOPY WITH PROPOFOL
Anesthesia: General

## 2017-03-01 ENCOUNTER — Ambulatory Visit: Payer: PPO | Admitting: Family Medicine

## 2017-03-03 ENCOUNTER — Ambulatory Visit: Payer: PPO | Admitting: Family Medicine

## 2017-03-10 ENCOUNTER — Ambulatory Visit (INDEPENDENT_AMBULATORY_CARE_PROVIDER_SITE_OTHER): Payer: PPO | Admitting: Family Medicine

## 2017-03-10 ENCOUNTER — Encounter: Payer: Self-pay | Admitting: Family Medicine

## 2017-03-10 VITALS — BP 116/68 | HR 101 | Temp 97.9°F | Ht 66.25 in | Wt 189.8 lb

## 2017-03-10 DIAGNOSIS — J302 Other seasonal allergic rhinitis: Secondary | ICD-10-CM

## 2017-03-10 DIAGNOSIS — K219 Gastro-esophageal reflux disease without esophagitis: Secondary | ICD-10-CM

## 2017-03-10 MED ORDER — AZELASTINE HCL 0.15 % NA SOLN: 1.0000 | Freq: Two times a day (BID) | NASAL | 11 refills | Status: DC

## 2017-03-10 MED ORDER — FLUTICASONE PROPIONATE 50 MCG/ACT NA SUSP: 2.0000 | Freq: Every day | NASAL | 11 refills | Status: DC

## 2017-03-10 NOTE — Progress Notes (Signed)
Subjective:    Patient ID: Ashley Rasmussen, female    DOB: 1951-02-18, 66 y.o.   MRN: [id number]  HPI Here for uri symptoms/ allergy  Had a tight feeling/discomfort in throat  Ears feel clogged  Eyes feel itchy/watery  Felt like LN swollen pnd and runny nose  All clear d/c  Some sinus pain - helped by mucinex (around eyes)  Lot of sneezing  occ headache (general)  A little cough  A little better today   mucinex prn -helps some  No longer takes claritin  Still uses flonase on and off    Heartburn is worse lately  Takes zantac on and off  No n/v  Patient Active Problem List   Diagnosis Date Noted  . Family history of melanoma 11/24/2016  . Hearing loss in left ear 10/02/2015  . GERD (gastroesophageal reflux disease) 05/08/2015  . Obesity 12/13/2012  . Encounter for routine gynecological examination 08/16/2012  . Colon cancer screening 08/16/2012  . Fasting hyperglycemia 08/16/2012  . Routine general medical examination at a health care facility 08/08/2012  . Uterine prolapse 07/06/2012  . BACK PAIN, LUMBAR, WITH RADICULOPATHY 08/15/2010  . Hyperlipidemia 04/13/2008  . Osteoporosis 04/06/2008  . Allergic rhinitis 04/15/2007  . BREAST CANCER, HX OF 04/15/2007   Past Medical History:  Diagnosis Date  . Allergic rhinitis, cause unspecified   . HLD (hyperlipidemia)   . Osteopenia   . Osteoporosis, unspecified   . Personal history of malignant neoplasm of breast    needs yearly mammogram and MRI every 2  . Thoracic or lumbosacral neuritis or radiculitis, unspecified    Past Surgical History:  Procedure Laterality Date  . Breast cancer excision    . LATISSIMUS FLAP TO BREAST     due to radiation burn   Social History  Substance Use Topics  . Smoking status: Former Research scientist (life sciences)  . Smokeless tobacco: Never Used     Comment: quit over 85yrs ago  . Alcohol use No   Family History  Problem Relation Age of Onset  . Other Mother        B-12 deficiency  . Breast  cancer Mother   . Melanoma Mother   . Myelodysplastic syndrome Mother   . Osteoporosis Mother   . Other Sister        B-12 deficiency  . Breast cancer Sister   . Osteoporosis Sister   . Breast cancer Unknown        Multiple cousins   Allergies  Allergen Reactions  . Alendronate Sodium     REACTION: GI side effects  . Pravachol     myalgias  . Raloxifene     REACTION: breast tenderness and leg pain  . Simvastatin     REACTION: myalgias   Current Outpatient Prescriptions on File Prior to Visit  Medication Sig Dispense Refill  . guaiFENesin (MUCINEX) 600 MG 12 hr tablet Take 1 tablet (600 mg total) by mouth 2 (two) times daily. 6 tablet 0  . Multiple Vitamin (MULTIVITAMIN) tablet Take 3 tablets by mouth daily.     . ranitidine (ZANTAC) 150 MG tablet Take 1 tablet (150 mg total) by mouth 2 (two) times daily. 60 tablet 11   No current facility-administered medications on file prior to visit.     Review of Systems Review of Systems  Constitutional: Negative for fever, appetite change, fatigue and unexpected weight change.  ENT neg for sinus pain or colored nasal d/c or nosebleed  Eyes: Negative for pain and visual  disturbance. neg for tongue or mouth swelling Respiratory: Negative for wheeze  and shortness of breath.   Cardiovascular: Negative for cp or palpitations    Gastrointestinal: Negative for nausea, diarrhea and constipation.  Genitourinary: Negative for urgency and frequency.  Skin: Negative for pallor or rash   Neurological: Negative for weakness, light-headedness, numbness and headaches.  Hematological: Negative for adenopathy. Does not bruise/bleed easily.  Psychiatric/Behavioral: Negative for dysphoric mood. The patient is not nervous/anxious.         Objective:   Physical Exam  Constitutional: She appears well-developed and well-nourished. No distress.  Well appearing   HENT:  Head: Normocephalic and atraumatic.  Right Ear: External ear normal.  Left Ear:  External ear normal.  Mouth/Throat: Oropharynx is clear and moist.  Nares are injected and congested  No sinus tenderness Clear rhinorrhea and post nasal drip    Eyes: Pupils are equal, round, and reactive to light. Conjunctivae and EOM are normal. Right eye exhibits no discharge. Left eye exhibits no discharge.  Neck: Normal range of motion. Neck supple.  Cardiovascular: Normal rate and normal heart sounds.   Pulmonary/Chest: Effort normal and breath sounds normal. No respiratory distress. She has no wheezes. She has no rales. She exhibits no tenderness.  Abdominal: Soft. Bowel sounds are normal. She exhibits no distension. There is no tenderness.  Lymphadenopathy:    She has no cervical adenopathy.  Neurological: She is alert.  Skin: Skin is warm and dry. No rash noted.  Psychiatric: She has a normal mood and affect.          Assessment & Plan:   Problem List Items Addressed This Visit      Respiratory   Allergic rhinitis    Will start an antihistamine - zyrtec otc 10 mg daily  Get back on flonase daily  astepro px for bid use in season Allergen avoidance disc  Update if not starting to improve in a week or if worsening          Digestive   GERD (gastroesophageal reflux disease) - Primary    Some breakthrough symptoms lately  Stressed imp of compliance with ranitidine 150 mg bid  Also diet disc Update if not improved

## 2017-03-10 NOTE — Patient Instructions (Signed)
Start zyrtec 10 mg daily over the counter (store brand is fine)  Continue flonase once daily every day  Also add astepro nasal spray twice daily as needed  Drink lots fluids Don't miss zantac doses!   Update if not starting to improve in a week or if worsening   If sinus pain comes back or other new symptoms let us know

## 2017-03-11 NOTE — Assessment & Plan Note (Signed)
Some breakthrough symptoms lately  Stressed imp of compliance with ranitidine 150 mg bid  Also diet disc Update if not improved

## 2017-03-11 NOTE — Assessment & Plan Note (Signed)
Will start an antihistamine - zyrtec otc 10 mg daily  Get back on flonase daily  astepro px for bid use in season Allergen avoidance disc  Update if not starting to improve in a week or if worsening

## 2017-04-05 ENCOUNTER — Other Ambulatory Visit: Payer: Self-pay | Admitting: Family Medicine

## 2017-04-05 DIAGNOSIS — Z1231 Encounter for screening mammogram for malignant neoplasm of breast: Secondary | ICD-10-CM

## 2017-05-10 ENCOUNTER — Ambulatory Visit: Payer: PPO

## 2017-05-11 ENCOUNTER — Encounter: Payer: Self-pay | Admitting: Family Medicine

## 2017-05-11 ENCOUNTER — Ambulatory Visit (INDEPENDENT_AMBULATORY_CARE_PROVIDER_SITE_OTHER): Payer: PPO | Admitting: Family Medicine

## 2017-05-11 VITALS — BP 118/70 | HR 95 | Temp 97.7°F | Wt 188.2 lb

## 2017-05-11 DIAGNOSIS — N3000 Acute cystitis without hematuria: Secondary | ICD-10-CM

## 2017-05-11 LAB — POC URINALSYSI DIPSTICK (AUTOMATED)
Blood, UA: NEGATIVE
Glucose, UA: NEGATIVE
Ketones, UA: NEGATIVE
Nitrite, UA: NEGATIVE
PH UA: 6 (ref 5.0–8.0)
Protein, UA: NEGATIVE
Spec Grav, UA: >=1.03 — AB (ref 1.010–1.025)
Urobilinogen, UA: 2 E.U./dL — AB

## 2017-05-11 MED ORDER — CEPHALEXIN 250 MG PO CAPS: 250.0000 mg | ORAL_CAPSULE | Freq: Two times a day (BID) | ORAL | 0 refills | Status: DC

## 2017-05-11 NOTE — Progress Notes (Signed)
Subjective:    Patient ID: Ashley Rasmussen, female    DOB: 14-Sep-1950, 66 y.o.   MRN: [id number]  HPI Here with urinary symptoms - about a week ago   Used azo   Frequency and urgency  Some burning on urination - better with azo   No back or flank pain  Does have bladder pain and pressure   Urine smells funny   No n/v or fever   Has a bad habit of holding urine too long -knows better  Also changed soap  Also used prep H for hemorrhoids -may have spread to the anterior area   Does not always drink enough water   ua with azo  Pos for leukocytes and nitrates and bilirubin   Results for orders placed or performed in visit on 05/11/17  POCT Urinalysis Dipstick (Automated)  Result Value Ref Range   Color, UA orange    Clarity, UA clear    Glucose, UA neg    Bilirubin, UA 1+    Ketones, UA neg    Spec Grav, UA >=1.030 (A) 1.010 - 1.025   Blood, UA neg    pH, UA 6.0 5.0 - 8.0   Protein, UA neg    Urobilinogen, UA 2.0 (A) 0.2 or 1.0 E.U./dL   Nitrite, UA neg    Leukocytes, UA Moderate (2+) (A) Negative      Patient Active Problem List   Diagnosis Date Noted  . Acute cystitis 05/11/2017  . Family history of melanoma 11/24/2016  . Hearing loss in left ear 10/02/2015  . GERD (gastroesophageal reflux disease) 05/08/2015  . Obesity 12/13/2012  . Encounter for routine gynecological examination 08/16/2012  . Colon cancer screening 08/16/2012  . Fasting hyperglycemia 08/16/2012  . Routine general medical examination at a health care facility 08/08/2012  . Uterine prolapse 07/06/2012  . BACK PAIN, LUMBAR, WITH RADICULOPATHY 08/15/2010  . Hyperlipidemia 04/13/2008  . Osteoporosis 04/06/2008  . Allergic rhinitis 04/15/2007  . BREAST CANCER, HX OF 04/15/2007   Past Medical History:  Diagnosis Date  . Allergic rhinitis, cause unspecified   . HLD (hyperlipidemia)   . Osteopenia   . Osteoporosis, unspecified   . Personal history of malignant neoplasm of breast    needs  yearly mammogram and MRI every 2  . Thoracic or lumbosacral neuritis or radiculitis, unspecified    Past Surgical History:  Procedure Laterality Date  . Breast cancer excision    . LATISSIMUS FLAP TO BREAST     due to radiation burn   Social History  Substance Use Topics  . Smoking status: Former Research scientist (life sciences)  . Smokeless tobacco: Never Used     Comment: quit over 20yrs ago  . Alcohol use No   Family History  Problem Relation Age of Onset  . Other Mother        B-12 deficiency  . Breast cancer Mother   . Melanoma Mother   . Myelodysplastic syndrome Mother   . Osteoporosis Mother   . Other Sister        B-12 deficiency  . Breast cancer Sister   . Osteoporosis Sister   . Breast cancer Unknown        Multiple cousins   Allergies  Allergen Reactions  . Alendronate Sodium     REACTION: GI side effects  . Pravachol     myalgias  . Raloxifene     REACTION: breast tenderness and leg pain  . Simvastatin     REACTION: myalgias   Current  Outpatient Prescriptions on File Prior to Visit  Medication Sig Dispense Refill  . Azelastine HCl 0.15 % SOLN Place 1 spray into the nose 2 (two) times daily. For allergies 30 mL 11  . cetirizine (ZYRTEC) 10 MG tablet Take 10 mg by mouth daily.    . fluticasone (FLONASE) 50 MCG/ACT nasal spray Place 2 sprays into both nostrils daily. 16 g 11  . guaiFENesin (MUCINEX) 600 MG 12 hr tablet Take 1 tablet (600 mg total) by mouth 2 (two) times daily. 6 tablet 0  . Multiple Vitamin (MULTIVITAMIN) tablet Take 3 tablets by mouth daily.     . ranitidine (ZANTAC) 150 MG tablet Take 1 tablet (150 mg total) by mouth 2 (two) times daily. 60 tablet 11   No current facility-administered medications on file prior to visit.     Review of Systems  Constitutional: Positive for fatigue. Negative for activity change, appetite change and fever.  HENT: Negative for congestion and sore throat.   Eyes: Negative for itching and visual disturbance.  Respiratory: Negative  for cough and shortness of breath.   Cardiovascular: Negative for leg swelling.  Gastrointestinal: Negative for abdominal distention, abdominal pain, constipation, diarrhea and nausea.  Endocrine: Negative for cold intolerance and polydipsia.  Genitourinary: Positive for dysuria, frequency and urgency. Negative for difficulty urinating, flank pain and hematuria.  Musculoskeletal: Negative for myalgias.  Skin: Negative for rash.  Allergic/Immunologic: Negative for immunocompromised state.  Neurological: Negative for dizziness and weakness.  Hematological: Negative for adenopathy.       Objective:   Physical Exam  Constitutional: She appears well-developed and well-nourished. No distress.  overwt and well app  HENT:  Head: Normocephalic and atraumatic.  Eyes: Pupils are equal, round, and reactive to light. Conjunctivae and EOM are normal.  Neck: Normal range of motion. Neck supple.  Cardiovascular: Normal rate, regular rhythm and normal heart sounds.   Pulmonary/Chest: Effort normal and breath sounds normal.  Abdominal: Soft. Bowel sounds are normal. She exhibits no distension. There is tenderness. There is no rebound.  No cva tenderness  Mild suprapubic tenderness  Musculoskeletal: She exhibits no edema.  Lymphadenopathy:    She has no cervical adenopathy.  Neurological: She is alert.  Skin: No rash noted.  Psychiatric: She has a normal mood and affect.          Assessment & Plan:   Problem List Items Addressed This Visit      Genitourinary   Acute cystitis    Uncomplicated Pos ua Pend cx  Keflex 250 bid  Azo prn  Inc fluids Update if not starting to improve in a week or if worsening        Relevant Orders   Urine Culture   POCT Urinalysis Dipstick (Automated) (Completed)

## 2017-05-11 NOTE — Patient Instructions (Signed)
Take the keflex as directed  Drink lots of water  Azo is ok if you need it  We will culture urine and contact you with a result   Update if not starting to improve in a week or if worsening

## 2017-05-12 NOTE — Assessment & Plan Note (Signed)
Uncomplicated Pos ua Pend cx  Keflex 250 bid  Azo prn  Inc fluids Update if not starting to improve in a week or if worsening

## 2017-05-14 LAB — URINE CULTURE
MICRO NUMBER:: 81153103
SPECIMEN QUALITY: ADEQUATE

## 2017-05-20 ENCOUNTER — Ambulatory Visit: Payer: PPO

## 2017-06-01 ENCOUNTER — Ambulatory Visit: Payer: PPO

## 2017-06-03 ENCOUNTER — Ambulatory Visit (INDEPENDENT_AMBULATORY_CARE_PROVIDER_SITE_OTHER): Payer: PPO

## 2017-06-03 ENCOUNTER — Ambulatory Visit: Payer: PPO

## 2017-06-03 DIAGNOSIS — Z23 Encounter for immunization: Secondary | ICD-10-CM

## 2017-06-08 ENCOUNTER — Ambulatory Visit
Admission: RE | Admit: 2017-06-08 | Discharge: 2017-06-08 | Disposition: A | Discharge status: Home / Self Care | Payer: PPO | Source: Ambulatory Visit | Attending: Family Medicine | Admitting: Family Medicine

## 2017-06-08 DIAGNOSIS — Z1231 Encounter for screening mammogram for malignant neoplasm of breast: Secondary | ICD-10-CM

## 2017-06-08 HISTORY — DX: Personal history of antineoplastic chemotherapy: Z92.21

## 2017-06-08 HISTORY — DX: Personal history of irradiation: Z92.3

## 2017-09-10 ENCOUNTER — Ambulatory Visit: Payer: PPO | Admitting: Family Medicine

## 2017-09-10 DIAGNOSIS — Z0289 Encounter for other administrative examinations: Secondary | ICD-10-CM

## 2017-09-15 ENCOUNTER — Ambulatory Visit: Payer: PPO | Admitting: Family Medicine

## 2017-09-17 ENCOUNTER — Encounter: Payer: Self-pay | Admitting: Family Medicine

## 2017-09-17 ENCOUNTER — Ambulatory Visit (INDEPENDENT_AMBULATORY_CARE_PROVIDER_SITE_OTHER): Payer: PPO | Admitting: Family Medicine

## 2017-09-17 VITALS — BP 128/82 | HR 90 | Temp 97.8°F | Ht 66.25 in | Wt 192.5 lb

## 2017-09-17 DIAGNOSIS — L290 Pruritus ani: Secondary | ICD-10-CM | POA: Insufficient documentation

## 2017-09-17 MED ORDER — HYDROCORTISONE 2.5 % RE CREA: TOPICAL_CREAM | RECTAL | 0 refills | Status: DC

## 2017-09-17 NOTE — Patient Instructions (Signed)
You may have some external hemorrhoids off and on  Avoid straining and avoid wiping aggressively or scratching  Keep area cool and clean  After bowel movements-clean with a non scented baby wipe and then pat dry   Also -TUKS medicated pads are helpful (they have witch hazel)   Try the anusol hc

## 2017-09-17 NOTE — Progress Notes (Signed)
Subjective:    Patient ID: Ashley Rasmussen, female    DOB: December 27, 1950, 67 y.o.   MRN: [id number]  HPI Here for concerns of hemorrhoids with itching   She has frequent bms (nl for her)  Ends up with itching and irritation externally (? From wiping) No pain or d/c or bleeding   She occ strains /not often  moreso has loose bowels  No abdominal pain or discomfort at all   Prep H does not helped  Has not tried TUKS pads  Uses caution- sometimes wiping stings   Symptoms come and go   Wt Readings from Last 3 Encounters:  09/17/17 192 lb 8 oz (87.3 kg)  05/11/17 188 lb 4 oz (85.4 kg)  03/10/17 189 lb 12 oz (86.1 kg)   30.84 kg/m   Patient Active Problem List   Diagnosis Date Noted  . Anal itching 09/17/2017  . Acute cystitis 05/11/2017  . Family history of melanoma 11/24/2016  . Hearing loss in left ear 10/02/2015  . GERD (gastroesophageal reflux disease) 05/08/2015  . Obesity 12/13/2012  . Encounter for routine gynecological examination 08/16/2012  . Colon cancer screening 08/16/2012  . Fasting hyperglycemia 08/16/2012  . Routine general medical examination at a health care facility 08/08/2012  . Uterine prolapse 07/06/2012  . BACK PAIN, LUMBAR, WITH RADICULOPATHY 08/15/2010  . Hyperlipidemia 04/13/2008  . Osteoporosis 04/06/2008  . Allergic rhinitis 04/15/2007  . BREAST CANCER, HX OF 04/15/2007   Past Medical History:  Diagnosis Date  . Allergic rhinitis, cause unspecified   . HLD (hyperlipidemia)   . Osteopenia   . Osteoporosis, unspecified   . Personal history of chemotherapy   . Personal history of malignant neoplasm of breast    needs yearly mammogram and MRI every 2  . Personal history of radiation therapy   . Thoracic or lumbosacral neuritis or radiculitis, unspecified    Past Surgical History:  Procedure Laterality Date  . BREAST BIOPSY Left 03/2015  . Breast cancer excision    . BREAST LUMPECTOMY Right   . LATISSIMUS FLAP TO BREAST     due to  radiation burn   Social History   Tobacco Use  . Smoking status: Former Research scientist (life sciences)  . Smokeless tobacco: Never Used  . Tobacco comment: quit over 70yrs ago  Substance Use Topics  . Alcohol use: No    Alcohol/week: 0.0 oz  . Drug use: No   Family History  Problem Relation Age of Onset  . Other Mother        B-12 deficiency  . Breast cancer Mother   . Melanoma Mother   . Myelodysplastic syndrome Mother   . Osteoporosis Mother   . Other Sister        B-12 deficiency  . Breast cancer Sister   . Osteoporosis Sister   . Breast cancer Unknown        Multiple cousins   Allergies  Allergen Reactions  . Alendronate Sodium     REACTION: GI side effects  . Pravachol     myalgias  . Raloxifene     REACTION: breast tenderness and leg pain  . Simvastatin     REACTION: myalgias   Current Outpatient Medications on File Prior to Visit  Medication Sig Dispense Refill  . Azelastine HCl 0.15 % SOLN Place 1 spray into the nose 2 (two) times daily. For allergies 30 mL 11  . cetirizine (ZYRTEC) 10 MG tablet Take 10 mg by mouth daily.    . fluticasone (FLONASE)  50 MCG/ACT nasal spray Place 2 sprays into both nostrils daily. 16 g 11  . guaiFENesin (MUCINEX) 600 MG 12 hr tablet Take 1 tablet (600 mg total) by mouth 2 (two) times daily. 6 tablet 0  . Multiple Vitamin (MULTIVITAMIN) tablet Take 3 tablets by mouth daily.     . ranitidine (ZANTAC) 150 MG tablet Take 1 tablet (150 mg total) by mouth 2 (two) times daily. 60 tablet 11   No current facility-administered medications on file prior to visit.     Review of Systems  Constitutional: Negative for activity change, appetite change, fatigue, fever and unexpected weight change.  HENT: Negative for congestion, ear pain, rhinorrhea, sinus pressure and sore throat.   Eyes: Negative for pain, redness and visual disturbance.  Respiratory: Negative for cough, shortness of breath and wheezing.   Cardiovascular: Negative for chest pain and  palpitations.  Gastrointestinal: Negative for abdominal distention, abdominal pain, anal bleeding, blood in stool, constipation, diarrhea, nausea and rectal pain.  Endocrine: Negative for polydipsia and polyuria.  Genitourinary: Negative for dysuria, frequency and urgency.  Musculoskeletal: Negative for arthralgias, back pain and myalgias.  Skin: Negative for pallor and rash.       Pos for itching in anal area   Allergic/Immunologic: Negative for environmental allergies.  Neurological: Negative for dizziness, syncope and headaches.  Hematological: Negative for adenopathy. Does not bruise/bleed easily.  Psychiatric/Behavioral: Negative for decreased concentration and dysphoric mood. The patient is not nervous/anxious.        Objective:   Physical Exam  Constitutional: She appears well-developed and well-nourished. No distress.  obese and well appearing   HENT:  Head: Normocephalic and atraumatic.  Mouth/Throat: Oropharynx is clear and moist.  Eyes: Conjunctivae and EOM are normal. Pupils are equal, round, and reactive to light. No scleral icterus.  Neck: Normal range of motion. Neck supple.  Cardiovascular: Normal rate, regular rhythm and normal heart sounds.  Pulmonary/Chest: Effort normal and breath sounds normal. No respiratory distress. She has no wheezes. She has no rales.  Abdominal: Soft. Bowel sounds are normal. She exhibits no distension and no mass. There is no tenderness. There is no rebound and no guarding.  Genitourinary:  Genitourinary Comments: Anal area is mildly injected w/o skin breakdown or drainage or lesions No prolapsed hemorrhoids  Nl anal tone  No M or tenderness   Musculoskeletal: She exhibits no edema.  Lymphadenopathy:    She has no cervical adenopathy.  Neurological: She is alert.  Skin: Skin is warm and dry. No rash noted. No erythema. No pallor.  Psychiatric: She has a normal mood and affect.          Assessment & Plan:   Problem List Items  Addressed This Visit      Musculoskeletal and Integument   Anal itching - Primary    Suspect intermittent ext hemorrhoids from staining and frequent bms  Disc using unscented baby wipes after bm and patting dry  TUKS pads otc are also an option  Px for anusol hc cream to use externally prn as well  Update if not starting to improve in a week or if worsening

## 2017-09-17 NOTE — Assessment & Plan Note (Signed)
Suspect intermittent ext hemorrhoids from staining and frequent bms  Disc using unscented baby wipes after bm and patting dry  TUKS pads otc are also an option  Px for anusol hc cream to use externally prn as well  Update if not starting to improve in a week or if worsening

## 2017-11-16 ENCOUNTER — Ambulatory Visit: Payer: PPO | Admitting: Family Medicine

## 2017-11-17 ENCOUNTER — Other Ambulatory Visit: Payer: Self-pay | Admitting: *Deleted

## 2017-11-17 MED ORDER — RANITIDINE HCL 150 MG PO TABS: 150.0000 mg | ORAL_TABLET | Freq: Two times a day (BID) | ORAL | 0 refills | Status: DC

## 2017-11-25 ENCOUNTER — Other Ambulatory Visit: Payer: PPO

## 2017-11-30 ENCOUNTER — Ambulatory Visit: Payer: PPO | Admitting: Family Medicine

## 2017-11-30 ENCOUNTER — Ambulatory Visit: Payer: PPO

## 2017-12-08 ENCOUNTER — Telehealth: Payer: Self-pay | Admitting: Family Medicine

## 2017-12-08 DIAGNOSIS — M810 Age-related osteoporosis without current pathological fracture: Secondary | ICD-10-CM

## 2017-12-08 DIAGNOSIS — Z0000 Encounter for general adult medical examination without abnormal findings: Secondary | ICD-10-CM

## 2017-12-08 DIAGNOSIS — R7309 Other abnormal glucose: Secondary | ICD-10-CM

## 2017-12-08 DIAGNOSIS — E7849 Other hyperlipidemia: Secondary | ICD-10-CM

## 2017-12-08 DIAGNOSIS — R7303 Prediabetes: Secondary | ICD-10-CM | POA: Insufficient documentation

## 2017-12-08 NOTE — Telephone Encounter (Signed)
-----   Message from Eustace Pen, LPN sent at 6/70/1410  3:15 PM EDT ----- Regarding: Labs 5/16 Lab orders needed. Thank you.  Insurance:  [employer]

## 2017-12-09 ENCOUNTER — Ambulatory Visit (INDEPENDENT_AMBULATORY_CARE_PROVIDER_SITE_OTHER): Payer: PPO

## 2017-12-09 ENCOUNTER — Other Ambulatory Visit: Payer: Self-pay

## 2017-12-09 VITALS — BP 130/84 | HR 85 | Temp 98.4°F | Ht 65.75 in | Wt 190.8 lb

## 2017-12-09 DIAGNOSIS — M810 Age-related osteoporosis without current pathological fracture: Secondary | ICD-10-CM

## 2017-12-09 DIAGNOSIS — R7309 Other abnormal glucose: Secondary | ICD-10-CM | POA: Diagnosis not present

## 2017-12-09 DIAGNOSIS — Z0000 Encounter for general adult medical examination without abnormal findings: Secondary | ICD-10-CM

## 2017-12-09 DIAGNOSIS — E7849 Other hyperlipidemia: Secondary | ICD-10-CM | POA: Diagnosis not present

## 2017-12-09 LAB — CBC WITH DIFFERENTIAL/PLATELET
Basophils Absolute: 0.1 10*3/uL (ref 0.0–0.1)
Basophils Relative: 2.2 % (ref 0.0–3.0)
EOS PCT: 1.8 % (ref 0.0–5.0)
Eosinophils Absolute: 0.1 10*3/uL (ref 0.0–0.7)
HCT: 41.9 % (ref 36.0–46.0)
Hemoglobin: 14.1 g/dL (ref 12.0–15.0)
Lymphocytes Relative: 31.6 % (ref 12.0–46.0)
Lymphs Abs: 1.9 10*3/uL (ref 0.7–4.0)
MCHC: 33.5 g/dL (ref 30.0–36.0)
MCV: 88.9 fl (ref 78.0–100.0)
MONO ABS: 0.6 10*3/uL (ref 0.1–1.0)
Monocytes Relative: 10.4 % (ref 3.0–12.0)
Neutro Abs: 3.2 10*3/uL (ref 1.4–7.7)
Neutrophils Relative %: 54 % (ref 43.0–77.0)
PLATELETS: 280 10*3/uL (ref 150.0–400.0)
RBC: 4.72 Mil/uL (ref 3.87–5.11)
RDW: 13.3 % (ref 11.5–15.5)
WBC: 6 10*3/uL (ref 4.0–10.5)

## 2017-12-09 LAB — COMPREHENSIVE METABOLIC PANEL
ALK PHOS: 83 U/L (ref 39–117)
ALT: 12 U/L (ref 0–35)
AST: 14 U/L (ref 0–37)
Albumin: 4 g/dL (ref 3.5–5.2)
BUN: 15 mg/dL (ref 6–23)
CO2: 27 mEq/L (ref 19–32)
CREATININE: 0.8 mg/dL (ref 0.40–1.20)
Calcium: 9.8 mg/dL (ref 8.4–10.5)
Chloride: 106 mEq/L (ref 96–112)
GFR: 75.92 mL/min (ref >60.00)
Glucose, Bld: 98 mg/dL (ref 70–99)
POTASSIUM: 3.7 meq/L (ref 3.5–5.1)
SODIUM: 141 meq/L (ref 135–145)
TOTAL PROTEIN: 7 g/dL (ref 6.0–8.3)
Total Bilirubin: 0.4 mg/dL (ref 0.2–1.2)

## 2017-12-09 LAB — HEMOGLOBIN A1C: Hgb A1c MFr Bld: 5.9 % (ref 4.6–6.5)

## 2017-12-09 LAB — LIPID PANEL
CHOLESTEROL: 209 mg/dL — AB (ref 0–200)
HDL: 42 mg/dL (ref >39.00)
LDL CALC: 130 mg/dL — AB (ref 0–99)
NonHDL: 166.99
Total CHOL/HDL Ratio: 5
Triglycerides: 185 mg/dL — ABNORMAL HIGH (ref 0.0–149.0)
VLDL: 37 mg/dL (ref 0.0–40.0)

## 2017-12-09 LAB — TSH: TSH: 1.46 u[IU]/mL (ref 0.35–4.50)

## 2017-12-09 LAB — VITAMIN D 25 HYDROXY (VIT D DEFICIENCY, FRACTURES): VITD: 29.01 ng/mL — AB (ref 30.00–100.00)

## 2017-12-09 MED ORDER — FLUTICASONE PROPIONATE 50 MCG/ACT NA SUSP: 2.0000 | Freq: Every day | NASAL | 11 refills | Status: DC

## 2017-12-09 MED ORDER — RANITIDINE HCL 150 MG PO TABS: 150.0000 mg | ORAL_TABLET | Freq: Two times a day (BID) | ORAL | 5 refills | Status: DC

## 2017-12-09 NOTE — Telephone Encounter (Signed)
Refill request for Flonase to be sent to [id number] - [place], [place].

## 2017-12-09 NOTE — Addendum Note (Signed)
Addended by: Tammi Sou on: 12/09/2017 04:57 PM   Modules accepted: Orders

## 2017-12-09 NOTE — Progress Notes (Signed)
Subjective:   Ashley Rasmussen is a 67 y.o. female who presents for Medicare Annual (Subsequent) preventive examination.  Review of Systems:  N/A Cardiac Risk Factors include: advanced age (>53men, >76 women);dyslipidemia     Objective:     Vitals: BP 130/84 (BP Location: Right Arm, Patient Position: Sitting, Cuff Size: Normal)   Pulse 85   Temp 98.4 F (36.9 C) (Oral)   Ht 5' 5.75" (1.67 m) Comment: no shoes  Wt 190 lb 12 oz (86.5 kg)   SpO2 98%   BMI 31.02 kg/m   Body mass index is 31.02 kg/m.  Advanced Directives 12/09/2017 09/29/2016 09/29/2016 09/26/2015  Does Patient Have a Medical Advance Directive? No No No No  Would patient like information on creating a medical advance directive? Yes (MAU/Ambulatory/Procedural Areas - Information given) Yes (Inpatient - patient requests chaplain consult to create a medical advance directive) No - Patient declined Yes - Educational materials given    Tobacco Social History   Tobacco Use  Smoking Status Former Smoker  Smokeless Tobacco Never Used  Tobacco Comment   quit over 34yrs ago     Counseling given: No Comment: quit over 47yrs ago   Clinical Intake:  Pre-visit preparation completed: Yes  Pain : No/denies pain Pain Score: 0-No pain     Nutritional Status: BMI > 30  Obese Nutritional Risks: None  How often do you need to have someone help you when you read instructions, pamphlets, or other written materials from your doctor or pharmacy?: 1 - Never What is the last grade level you completed in school?: 12th grade  Interpreter Needed?: No  Comments: pt lives with spouse Information entered by :: LPinson, LPN  Past Medical History:  Diagnosis Date  . Allergic rhinitis, cause unspecified   . HLD (hyperlipidemia)   . Osteopenia   . Osteoporosis, unspecified   . Personal history of chemotherapy   . Personal history of malignant neoplasm of breast    needs yearly mammogram and MRI every 2  . Personal history of  radiation therapy   . Thoracic or lumbosacral neuritis or radiculitis, unspecified    Past Surgical History:  Procedure Laterality Date  . BREAST BIOPSY Left 03/2015  . Breast cancer excision    . BREAST LUMPECTOMY Right   . LATISSIMUS FLAP TO BREAST     due to radiation burn   Family History  Problem Relation Age of Onset  . Other Mother        B-12 deficiency  . Breast cancer Mother   . Melanoma Mother   . Myelodysplastic syndrome Mother   . Osteoporosis Mother   . Other Sister        B-12 deficiency  . Breast cancer Sister   . Osteoporosis Sister   . Breast cancer Unknown        Multiple cousins   Social History   Socioeconomic History  . Marital status: Married    Spouse name: Not on file  . Number of children: Not on file  . Years of education: Not on file  . Highest education level: Not on file  Occupational History  . Not on file  Social Needs  . Financial resource strain: Not on file  . Food insecurity:    Worry: Not on file    Inability: Not on file  . Transportation needs:    Medical: Not on file    Non-medical: Not on file  Tobacco Use  . Smoking status: Former Research scientist (life sciences)  .  Smokeless tobacco: Never Used  . Tobacco comment: quit over 82yrs ago  Substance and Sexual Activity  . Alcohol use: No    Alcohol/week: 0.0 oz  . Drug use: No  . Sexual activity: Never  Lifestyle  . Physical activity:    Days per week: Not on file    Minutes per session: Not on file  . Stress: Not on file  Relationships  . Social connections:    Talks on phone: Not on file    Gets together: Not on file    Attends religious service: Not on file    Active member of club or organization: Not on file    Attends meetings of clubs or organizations: Not on file    Relationship status: Not on file  Other Topics Concern  . Not on file  Social History Narrative   Married      4 children          Outpatient Encounter Medications as of 12/09/2017  Medication Sig  .  fluticasone (FLONASE) 50 MCG/ACT nasal spray Place 2 sprays into both nostrils daily.  . hydrocortisone (ANUSOL-HC) 2.5 % rectal cream Apply to external anal area for itch once daily as needed  . Multiple Vitamin (MULTIVITAMIN) tablet Take 3 tablets by mouth daily.   . ranitidine (ZANTAC) 150 MG tablet Take 1 tablet (150 mg total) by mouth 2 (two) times daily.  . [DISCONTINUED] Azelastine HCl 0.15 % SOLN Place 1 spray into the nose 2 (two) times daily. For allergies  . [DISCONTINUED] guaiFENesin (MUCINEX) 600 MG 12 hr tablet Take 1 tablet (600 mg total) by mouth 2 (two) times daily.  . cetirizine (ZYRTEC) 10 MG tablet Take 10 mg by mouth daily.   No facility-administered encounter medications on file as of 12/09/2017.     Activities of Daily Living In your present state of health, do you have any difficulty performing the following activities: 12/09/2017  Hearing? Y  Vision? Y  Difficulty concentrating or making decisions? N  Walking or climbing stairs? N  Dressing or bathing? N  Doing errands, shopping? N  Preparing Food and eating ? N  Using the Toilet? N  In the past six months, have you accidently leaked urine? N  Do you have problems with loss of bowel control? N  Managing your Medications? N  Managing your Finances? N  Housekeeping or managing your Housekeeping? N  Some recent data might be hidden    Patient Care Team: Tower, [name], MD as PCP - General    Assessment:   This is a routine wellness examination for Ashley Rasmussen.   Hearing Screening   125Hz  250Hz  500Hz  1000Hz  2000Hz  3000Hz  4000Hz  6000Hz  8000Hz   Right ear:   0 0 40  0    Left ear:   0 0 0  0      Visual Acuity Screening   Right eye Left eye Both eyes  Without correction: 20/40-1 20/50 20/25-2  With correction:     Comments: Mar 2018 with Dr. Maryruth Hancock B.    Exercise Activities and Dietary recommendations Current Exercise Habits: Home exercise routine, Type of exercise: walking, Time (Minutes): 15, Frequency  (Times/Week): 7, Weekly Exercise (Minutes/Week): 105, Intensity: Mild, Exercise limited by: None identified  Goals    . Exercise 3x per week (30 min per time)     When weather permits, I will continue to walk 15- 30 minutes daily.        Fall Risk Fall Risk  12/09/2017 11/24/2016 09/26/2015  Falls in the past year? No No No   Depression Screen PHQ 2/9 Scores 12/09/2017 11/24/2016 09/26/2015  PHQ - 2 Score 0 0 0  PHQ- 9 Score 0 0 -     Cognitive Function MMSE - Mini Mental State Exam 12/09/2017 09/26/2015  Orientation to time 5 5  Orientation to Place 5 5  Registration 3 3  Attention/ Calculation 0 5  Recall 2 3  Language- name 2 objects 0 0  Language- repeat 1 1  Language- follow 3 step command 3 3  Language- read & follow direction 0 1  Write a sentence 0 0  Copy design 0 0  Total score 19 26     PLEASE NOTE: A Mini-Cog screen was completed. Maximum score is 20. A value of 0 denotes this part of Folstein MMSE was not completed or the patient failed this part of the Mini-Cog screening.   Mini-Cog Screening Orientation to Time - Max 5 pts Orientation to Place - Max 5 pts Registration - Max 3 pts Recall - Max 3 pts Language Repeat - Max 1 pts Language Follow 3 Step Command - Max 3 pts     Immunization History  Administered Date(s) Administered  . Influenza Split 06/21/2012  . Influenza Whole 07/13/2002  . Influenza,inj,Quad PF,6+ Mos 05/23/2013, 09/26/2015, 05/22/2016, 06/03/2017  . Pneumococcal Conjugate-13 09/26/2015  . Pneumococcal Polysaccharide-23 11/24/2016  . Td 05/27/2005  . Tdap 11/24/2016    Screening Tests Health Maintenance  Topic Date Due  . COLONOSCOPY  12/31/2017 (Originally 05/25/2000)  . INFLUENZA VACCINE  02/24/2018  . MAMMOGRAM  06/08/2018  . TETANUS/TDAP  11/25/2026  . DEXA SCAN  Completed  . Hepatitis C Screening  Completed  . PNA vac Low Risk Adult  Completed       Plan:     I have personally reviewed, addressed, and noted the following  in the patient's chart:  A. Medical and social history B. Use of alcohol, tobacco or illicit drugs  C. Current medications and supplements D. Functional ability and status E.  Nutritional status F.  Physical activity G. Advance directives H. List of other physicians I.  Hospitalizations, surgeries, and ER visits in previous 12 months J.  [place] to include hearing, vision, cognitive, depression L. Referrals and appointments - none  In addition, I have reviewed and discussed with patient certain preventive protocols, quality metrics, and best practice recommendations. A written personalized care plan for preventive services as well as general preventive health recommendations were provided to patient.  See attached scanned questionnaire for additional information.   Signed,   Lindell Noe, MHA, BS, LPN Health Coach

## 2017-12-09 NOTE — Progress Notes (Signed)
PCP notes:   Health maintenance:  No gaps identified.  Abnormal screenings:   Mini-Cog score: 19/20 MMSE - Mini Mental State Exam 12/09/2017 09/26/2015  Orientation to time 5 5  Orientation to Place 5 5  Registration 3 3  Attention/ Calculation 0 5  Recall 2 3  Language- name 2 objects 0 0  Language- repeat 1 1  Language- follow 3 step command 3 3  Language- read & follow direction 0 1  Write a sentence 0 0  Copy design 0 0  Total score 19 26       Hearing - failed  Hearing Screening   125Hz  250Hz  500Hz  1000Hz  2000Hz  3000Hz  4000Hz  6000Hz  8000Hz   Right ear:   0 0 40  0    Left ear:   0 0 0  0     Patient concerns:   Refill requested for Flonase. PCP notified.   Nurse concerns:  None  Next PCP appt:   I reviewed health advisor's note, was available for consultation, and agree with documentation and plan. Loura Pardon MD   12/14/17 @ 1515

## 2017-12-09 NOTE — Patient Instructions (Signed)
Marland Kitchen  Ashley Rasmussen , Thank you for taking time to come for your Medicare Wellness Visit. I appreciate your ongoing commitment to your health goals. Please review the following plan we discussed and let me know if I can assist you in the future.   These are the goals we discussed: Goals    . Exercise 3x per week (30 min per time)     When weather permits, I will continue to walk 15- 30 minutes daily.        This is a list of the screening recommended for you and due dates:  Health Maintenance  Topic Date Due  . Colon Cancer Screening  12/31/2017*  . Flu Shot  02/24/2018  . Mammogram  06/08/2018  . Tetanus Vaccine  11/25/2026  . DEXA scan (bone density measurement)  Completed  .  Hepatitis C: One time screening is recommended by Center for Disease Control  (CDC) for  adults born from [birth year] through 1966.   Completed  . Pneumonia vaccines  Completed  *Topic was postponed. The date shown is not the original due date.   Preventive Care for Adults  A healthy lifestyle and preventive care can promote health and wellness. Preventive health guidelines for adults include the following key practices.  . A routine yearly physical is a good way to check with your health care provider about your health and preventive screening. It is a chance to share any concerns and updates on your health and to receive a thorough exam.  . Visit your dentist for a routine exam and preventive care every 6 months. Brush your teeth twice a day and floss once a day. Good oral hygiene prevents tooth decay and gum disease.  . The frequency of eye exams is based on your age, health, family medical history, use  of contact lenses, and other factors. Follow your health care provider's recommendations for frequency of eye exams.  . Eat a healthy diet. Foods like vegetables, fruits, whole grains, low-fat dairy products, and lean protein foods contain the nutrients you need without too many calories. Decrease your intake of foods  high in solid fats, added sugars, and salt. Eat the right amount of calories for you. Get information about a proper diet from your health care provider, if necessary.  . Regular physical exercise is one of the most important things you can do for your health. Most adults should get at least 150 minutes of moderate-intensity exercise (any activity that increases your heart rate and causes you to sweat) each week. In addition, most adults need muscle-strengthening exercises on 2 or more days a week.  Silver Sneakers may be a benefit available to you. To determine eligibility, you may visit the website: www.silversneakers.com or contact program at 934-022-2901 Mon-Fri between 8AM-8PM.   . Maintain a healthy weight. The body mass index (BMI) is a screening tool to identify possible weight problems. It provides an estimate of body fat based on height and weight. Your health care provider can find your BMI and can help you achieve or maintain a healthy weight.   For adults 20 years and older: ? A BMI below 18.5 is considered underweight. ? A BMI of 18.5 to 24.9 is normal. ? A BMI of 25 to 29.9 is considered overweight. ? A BMI of 30 and above is considered obese.   . Maintain normal blood lipids and cholesterol levels by exercising and minimizing your intake of saturated fat. Eat a balanced diet with plenty of fruit and  vegetables. Blood tests for lipids and cholesterol should begin at age 16 and be repeated every 5 years. If your lipid or cholesterol levels are high, you are over 50, or you are at high risk for heart disease, you may need your cholesterol levels checked more frequently. Ongoing high lipid and cholesterol levels should be treated with medicines if diet and exercise are not working.  . If you smoke, find out from your health care provider how to quit. If you do not use tobacco, please do not start.  . If you choose to drink alcohol, please do not consume more than 2 drinks per day.  One drink is considered to be 12 ounces (355 mL) of beer, 5 ounces (148 mL) of wine, or 1.5 ounces (44 mL) of liquor.  . If you are 101-53 years old, ask your health care provider if you should take aspirin to prevent strokes.  . Use sunscreen. Apply sunscreen liberally and repeatedly throughout the day. You should seek shade when your shadow is shorter than you. Protect yourself by wearing long sleeves, pants, a wide-brimmed hat, and sunglasses year round, whenever you are outdoors.  . Once a month, do a whole body skin exam, using a mirror to look at the skin on your back. Tell your health care provider of new moles, moles that have irregular borders, moles that are larger than a pencil eraser, or moles that have changed in shape or color.

## 2017-12-14 ENCOUNTER — Encounter: Payer: PPO | Admitting: Family Medicine

## 2018-01-26 ENCOUNTER — Encounter: Payer: Self-pay | Admitting: Family Medicine

## 2018-01-26 ENCOUNTER — Ambulatory Visit (INDEPENDENT_AMBULATORY_CARE_PROVIDER_SITE_OTHER): Payer: PPO | Admitting: Family Medicine

## 2018-01-26 VITALS — BP 128/80 | HR 98 | Temp 98.0°F | Ht 65.75 in | Wt 192.5 lb

## 2018-01-26 DIAGNOSIS — R7309 Other abnormal glucose: Secondary | ICD-10-CM

## 2018-01-26 DIAGNOSIS — M810 Age-related osteoporosis without current pathological fracture: Secondary | ICD-10-CM

## 2018-01-26 DIAGNOSIS — Z0000 Encounter for general adult medical examination without abnormal findings: Secondary | ICD-10-CM | POA: Diagnosis not present

## 2018-01-26 DIAGNOSIS — Z6831 Body mass index (BMI) 31.0-31.9, adult: Secondary | ICD-10-CM

## 2018-01-26 DIAGNOSIS — Z1211 Encounter for screening for malignant neoplasm of colon: Secondary | ICD-10-CM

## 2018-01-26 DIAGNOSIS — E2839 Other primary ovarian failure: Secondary | ICD-10-CM | POA: Diagnosis not present

## 2018-01-26 DIAGNOSIS — R7301 Impaired fasting glucose: Secondary | ICD-10-CM

## 2018-01-26 DIAGNOSIS — E6609 Other obesity due to excess calories: Secondary | ICD-10-CM | POA: Diagnosis not present

## 2018-01-26 DIAGNOSIS — E7849 Other hyperlipidemia: Secondary | ICD-10-CM | POA: Diagnosis not present

## 2018-01-26 NOTE — Progress Notes (Signed)
Subjective:    Patient ID: Ashley Rasmussen, female    DOB: 02/05/1951, 67 y.o.   MRN: [id number]  HPI Here for health maintenance exam and to review chronic medical problems    More aches and pains- esp in legs  Poss some arthritis  Hard to exercise due to this   Had amw on 5/16  Wt Readings from Last 3 Encounters:  01/26/18 192 lb 8 oz (87.3 kg)  12/09/17 190 lb 12 oz (86.5 kg)  09/17/17 192 lb 8 oz (87.3 kg)  eating better -more salads and produce Needs to cut back sugar/sweets  31.31 kg/m   Colon cancer screening  More bloating in her abdomen  Never got her colonoscopy -wants to re schedule   Mammogram 11/18 -neg Very high risk  Also past hx of breast cancer Self breast exam - no new lumps   dexa 9/09 osteopenia (past hx of OP) Wants to get dexa set up  No falls or fx Has not taken D for 2 weeks- ran out =level is 29.1 Was on tamoxifen for 2 y with breast cancer   No gyn problems  No post menopausal bleeding   Zoster status -interested in shingrix   BP Readings from Last 3 Encounters:  01/26/18 140/78  12/09/17 130/84  09/17/17 128/82    Hx of elevated glucose Lab Results  Component Value Date   HGBA1C 5.9 12/09/2017  up from 5.6 Does eat sweets   Hyperlipidemia Lab Results  Component Value Date   CHOL 209 (H) 12/09/2017   CHOL 221 (H) 11/24/2016   CHOL 252 (H) 09/27/2015   Lab Results  Component Value Date   HDL 42.00 12/09/2017   HDL 45.90 11/24/2016   HDL 41.60 09/27/2015   Lab Results  Component Value Date   LDLCALC 130 (H) 12/09/2017   LDLCALC 158 (H) 04/06/2008   Lab Results  Component Value Date   TRIG 185.0 (H) 12/09/2017   TRIG 203.0 (H) 11/24/2016   TRIG 237.0 (H) 09/27/2015   Lab Results  Component Value Date   CHOLHDL 5 12/09/2017   CHOLHDL 5 11/24/2016   CHOLHDL 6 09/27/2015   Lab Results  Component Value Date   LDLDIRECT 153.0 11/24/2016   LDLDIRECT 177.0 09/27/2015   LDLDIRECT 140.9 08/09/2012   LDL improved    Eating less fats - fried food and red meat    Patient Active Problem List   Diagnosis Date Noted  . Estrogen deficiency 01/26/2018  . Elevated glucose level 12/08/2017  . Anal itching 09/17/2017  . Family history of melanoma 11/24/2016  . Hearing loss in left ear 10/02/2015  . GERD (gastroesophageal reflux disease) 05/08/2015  . Obesity 12/13/2012  . Encounter for routine gynecological examination 08/16/2012  . Colon cancer screening 08/16/2012  . Fasting hyperglycemia 08/16/2012  . Routine general medical examination at a health care facility 08/08/2012  . Uterine prolapse 07/06/2012  . BACK PAIN, LUMBAR, WITH RADICULOPATHY 08/15/2010  . Hyperlipidemia 04/13/2008  . Osteoporosis 04/06/2008  . Allergic rhinitis 04/15/2007  . BREAST CANCER, HX OF 04/15/2007   Past Medical History:  Diagnosis Date  . Allergic rhinitis, cause unspecified   . HLD (hyperlipidemia)   . Osteopenia   . Osteoporosis, unspecified   . Personal history of chemotherapy   . Personal history of malignant neoplasm of breast    needs yearly mammogram and MRI every 2  . Personal history of radiation therapy   . Thoracic or lumbosacral neuritis or radiculitis, unspecified  Past Surgical History:  Procedure Laterality Date  . BREAST BIOPSY Left 03/2015  . Breast cancer excision    . BREAST LUMPECTOMY Right   . LATISSIMUS FLAP TO BREAST     due to radiation burn   Social History   Tobacco Use  . Smoking status: Former Research scientist (life sciences)  . Smokeless tobacco: Never Used  . Tobacco comment: quit over 52yrs ago  Substance Use Topics  . Alcohol use: No    Alcohol/week: 0.0 oz  . Drug use: No   Family History  Problem Relation Age of Onset  . Other Mother        B-12 deficiency  . Breast cancer Mother   . Melanoma Mother   . Myelodysplastic syndrome Mother   . Osteoporosis Mother   . Other Sister        B-12 deficiency  . Breast cancer Sister   . Osteoporosis Sister   . Breast cancer Unknown         Multiple cousins   Allergies  Allergen Reactions  . Alendronate Sodium     REACTION: GI side effects  . Pravachol     myalgias  . Raloxifene     REACTION: breast tenderness and leg pain  . Simvastatin     REACTION: myalgias   Current Outpatient Medications on File Prior to Visit  Medication Sig Dispense Refill  . cetirizine (ZYRTEC) 10 MG tablet Take 10 mg by mouth daily.    . fluticasone (FLONASE) 50 MCG/ACT nasal spray Place 2 sprays into both nostrils daily. 16 g 11  . hydrocortisone (ANUSOL-HC) 2.5 % rectal cream Apply to external anal area for itch once daily as needed 30 g 0  . Multiple Vitamin (MULTIVITAMIN) tablet Take 3 tablets by mouth daily.     . ranitidine (ZANTAC) 150 MG tablet Take 1 tablet (150 mg total) by mouth 2 (two) times daily. 60 tablet 5   No current facility-administered medications on file prior to visit.     Review of Systems  Constitutional: Negative for activity change, appetite change, fatigue, fever and unexpected weight change.  HENT: Negative for congestion, ear pain, rhinorrhea, sinus pressure and sore throat.   Eyes: Negative for pain, redness and visual disturbance.  Respiratory: Negative for cough, shortness of breath and wheezing.   Cardiovascular: Negative for chest pain and palpitations.  Gastrointestinal: Negative for abdominal pain, blood in stool, constipation and diarrhea.  Endocrine: Negative for polydipsia and polyuria.  Genitourinary: Negative for dysuria, frequency and urgency.  Musculoskeletal: Positive for arthralgias and myalgias. Negative for back pain.       Leg/knee pain  Skin: Negative for pallor and rash.  Allergic/Immunologic: Negative for environmental allergies.  Neurological: Negative for dizziness, syncope and headaches.  Hematological: Negative for adenopathy. Does not bruise/bleed easily.  Psychiatric/Behavioral: Negative for decreased concentration and dysphoric mood. The patient is not nervous/anxious.          Objective:   Physical Exam  Constitutional: She appears well-developed and well-nourished. No distress.  obese and well appearing   HENT:  Head: Normocephalic and atraumatic.  Right Ear: External ear normal.  Left Ear: External ear normal.  Mouth/Throat: Oropharynx is clear and moist.  Eyes: Pupils are equal, round, and reactive to light. Conjunctivae and EOM are normal. No scleral icterus.  Neck: Normal range of motion. Neck supple. No JVD present. Carotid bruit is not present. No thyromegaly present.  Cardiovascular: Normal rate, regular rhythm, normal heart sounds and intact distal pulses. Exam reveals no gallop.  Pulmonary/Chest: Effort normal and breath sounds normal. No respiratory distress. She has no wheezes. She exhibits no tenderness. No breast tenderness, discharge or bleeding.  Abdominal: Soft. Bowel sounds are normal. She exhibits no distension, no abdominal bruit and no mass. There is no tenderness.  Genitourinary: No breast tenderness, discharge or bleeding.  Genitourinary Comments: Breast exam: No mass, nodules, thickening, tenderness, bulging, retraction, inflamation, nipple discharge or skin changes noted.  No axillary or clavicular LA.    Baseline skin changes/scar/skin flap repair in R breast from past surgery     Musculoskeletal: Normal range of motion. She exhibits no edema or tenderness.  Mild kyphosis   Lymphadenopathy:    She has no cervical adenopathy.  Neurological: She is alert. She has normal reflexes. No cranial nerve deficit. She exhibits normal muscle tone. Coordination normal.  Skin: Skin is warm and dry. No rash noted. No erythema. No pallor.  Tanned Solar lentigines diffusely   Psychiatric: She has a normal mood and affect.  Pleasant           Assessment & Plan:   Problem List Items Addressed This Visit      Musculoskeletal and Integument   Osteoporosis    Enc to re start vit D dexa ordered No falls or fx Enc exercise         Other    Colon cancer screening    Refer for screening colonoscopy       Relevant Orders   Ambulatory referral to Gastroenterology   Elevated glucose level    Lab Results  Component Value Date   HGBA1C 5.9 12/09/2017   disc imp of low glycemic diet and wt loss to prevent DM2       Estrogen deficiency   Relevant Orders   DG Bone Density   Hyperlipidemia    Disc goals for lipids and reasons to control them Rev last labs with pt Rev low sat fat diet in detail slt improved  Enc to keep working on better diet      Obesity    .Discussed how this problem influences overall health and the risks it imposes  Reviewed plan for weight loss with lower calorie diet (via better food choices and also portion control or program like weight watchers) and exercise building up to or more than 30 minutes 5 days per week including some aerobic activity         Routine general medical examination at a health care facility - Primary    Reviewed health habits including diet and exercise and skin cancer prevention Reviewed appropriate screening tests for age  Also reviewed health mt list, fam hx and immunization status , as well as social and family history    Rev amw See HPI Labs reviewed  Ref for dexa and colonoscopy  Disc shingrix vaccine  Wt loss enc       Other Visit Diagnoses    Fasting hyperglycemia

## 2018-01-26 NOTE — Patient Instructions (Addendum)
Exercise (low impact) may help your aches and pains- walking/pain or water exercise   Consider weight watchers for weight loss -it really works  Try to get most of your carbohydrates from produce (with the exception of white potatoes)  Eat less bread/pasta/rice/snack foods/cereals/sweets and other items from the middle of the grocery store (processed carbs)   Start back on your vitamin D for bone health   We will refer you for bone density test  Also for a colonoscopy   If you are interested in the new shingles vaccine (Shingrix) - call your local pharmacy to check on coverage and availability  If affordable - get on a wait list   Cholesterol is a little improved  Avoid red meat/ fried foods/ egg yolks/ fatty breakfast meats/ butter, cheese and high fat dairy/ and shellfish

## 2018-01-28 NOTE — Assessment & Plan Note (Signed)
Disc goals for lipids and reasons to control them Rev last labs with pt Rev low sat fat diet in detail slt improved  Enc to keep working on better diet

## 2018-01-28 NOTE — Assessment & Plan Note (Signed)
Enc to re start vit D dexa ordered No falls or fx Enc exercise

## 2018-01-28 NOTE — Assessment & Plan Note (Signed)
Reviewed health habits including diet and exercise and skin cancer prevention Reviewed appropriate screening tests for age  Also reviewed health mt list, fam hx and immunization status , as well as social and family history    Rev amw See HPI Labs reviewed  Ref for dexa and colonoscopy  Disc shingrix vaccine  Wt loss enc

## 2018-01-28 NOTE — Assessment & Plan Note (Signed)
Lab Results  Component Value Date   HGBA1C 5.9 12/09/2017   disc imp of low glycemic diet and wt loss to prevent DM2

## 2018-01-28 NOTE — Assessment & Plan Note (Signed)
Discussed how this problem influences overall health and the risks it imposes  Reviewed plan for weight loss with lower calorie diet (via better food choices and also portion control or program like weight watchers) and exercise building up to or more than 30 minutes 5 days per week including some aerobic activity    

## 2018-01-28 NOTE — Assessment & Plan Note (Signed)
Refer for screening colonoscopy 

## 2018-02-14 ENCOUNTER — Ambulatory Visit (INDEPENDENT_AMBULATORY_CARE_PROVIDER_SITE_OTHER): Payer: PPO | Admitting: Family Medicine

## 2018-02-14 ENCOUNTER — Encounter: Payer: Self-pay | Admitting: Family Medicine

## 2018-02-14 VITALS — BP 126/80 | HR 104 | Temp 98.4°F | Ht 65.75 in | Wt 191.0 lb

## 2018-02-14 DIAGNOSIS — R300 Dysuria: Secondary | ICD-10-CM | POA: Diagnosis not present

## 2018-02-14 LAB — POC URINALSYSI DIPSTICK (AUTOMATED)
BILIRUBIN UA: NEGATIVE
Glucose, UA: NEGATIVE
Ketones, UA: NEGATIVE
Protein, UA: NEGATIVE
RBC UA: NEGATIVE
Spec Grav, UA: 1.025 (ref 1.010–1.025)
UROBILINOGEN UA: 0.2 U/dL
pH, UA: 5 (ref 5.0–8.0)

## 2018-02-14 MED ORDER — SULFAMETHOXAZOLE-TRIMETHOPRIM 800-160 MG PO TABS: 1.0000 | ORAL_TABLET | Freq: Two times a day (BID) | ORAL | 0 refills | Status: AC

## 2018-02-14 NOTE — Progress Notes (Signed)
Dr. Frederico Hamman T. Siddhi Dornbush, MD, [facility] [place], [address] Phone: [phone] Fax: [phone]  02/14/2018  Patient: Ashley Rasmussen, MRN: [id number], DOB: September 25, 1950, 67 y.o.  Primary Physician:  Tower, [name], MD   Chief Complaint  Patient presents with  . Dysuria    frequency and urgency   Subjective:   [name] presents with burning, urgency.  No vaginal discharge or external irritation.  No STD exposure. No abd pain, no flank pain. Has been taking some AZO.  Has had UTI in the past.    The PMH, PSH, Social History, Family History, Medications, and allergies have been reviewed in [facility], and have been updated if relevant.  Patient Active Problem List   Diagnosis Date Noted  . Estrogen deficiency 01/26/2018  . Elevated glucose level 12/08/2017  . Anal itching 09/17/2017  . Family history of melanoma 11/24/2016  . Hearing loss in left ear 10/02/2015  . GERD (gastroesophageal reflux disease) 05/08/2015  . Obesity 12/13/2012  . Encounter for routine gynecological examination 08/16/2012  . Colon cancer screening 08/16/2012  . Routine general medical examination at a health care facility 08/08/2012  . Uterine prolapse 07/06/2012  . BACK PAIN, LUMBAR, WITH RADICULOPATHY 08/15/2010  . Hyperlipidemia 04/13/2008  . Osteoporosis 04/06/2008  . Allergic rhinitis 04/15/2007  . BREAST CANCER, HX OF 04/15/2007    Past Medical History:  Diagnosis Date  . Allergic rhinitis, cause unspecified   . HLD (hyperlipidemia)   . Osteopenia   . Osteoporosis, unspecified   . Personal history of chemotherapy   . Personal history of malignant neoplasm of breast    needs yearly mammogram and MRI every 2  . Personal history of radiation therapy   . Thoracic or lumbosacral neuritis or radiculitis, unspecified     Past Surgical History:  Procedure Laterality Date  . BREAST BIOPSY Left 03/2015  . Breast  cancer excision    . BREAST LUMPECTOMY Right   . LATISSIMUS FLAP TO BREAST     due to radiation burn    Social History   Socioeconomic History  . Marital status: Married    Spouse name: Not on file  . Number of children: Not on file  . Years of education: Not on file  . Highest education level: Not on file  Occupational History  . Not on file  Social Needs  . Financial resource strain: Not on file  . Food insecurity:    Worry: Not on file    Inability: Not on file  . Transportation needs:    Medical: Not on file    Non-medical: Not on file  Tobacco Use  . Smoking status: Former Research scientist (life sciences)  . Smokeless tobacco: Never Used  . Tobacco comment: quit over 2yrs ago  Substance and Sexual Activity  . Alcohol use: No    Alcohol/week: 0.0 oz  . Drug use: No  . Sexual activity: Never  Lifestyle  . Physical activity:    Days per week: Not on file    Minutes per session: Not on file  . Stress: Not on file  Relationships  . Social connections:    Talks on phone: Not on file    Gets together: Not on file    Attends religious service: Not on file    Active member of club or organization: Not on file    Attends meetings of clubs or organizations: Not on file    Relationship  status: Not on file  . Intimate partner violence:    Fear of current or ex partner: Not on file    Emotionally abused: Not on file    Physically abused: Not on file    Forced sexual activity: Not on file  Other Topics Concern  . Not on file  Social History Narrative   Married      4 children          Family History  Problem Relation Age of Onset  . Other Mother        B-12 deficiency  . Breast cancer Mother   . Melanoma Mother   . Myelodysplastic syndrome Mother   . Osteoporosis Mother   . Other Sister        B-12 deficiency  . Breast cancer Sister   . Osteoporosis Sister   . Breast cancer Unknown        Multiple cousins    Allergies  Allergen Reactions  . Alendronate Sodium      REACTION: GI side effects  . Pravachol     myalgias  . Raloxifene     REACTION: breast tenderness and leg pain  . Simvastatin     REACTION: myalgias    Medication list reviewed and updated in full in [facility].  GEN:  no fevers, chills. GI: No n/v/d, eating normally Otherwise, ROS is as per the HPI.  Objective:   Blood pressure 126/80, pulse (!) 104, temperature 98.4 F (36.9 C), temperature source Oral, height 5' 5.75" (1.67 m), weight 191 lb (86.6 kg).  GEN: WDWN, A&Ox4,NAD. Non-toxic HEENT: Atraumatc, normocephalic. CV: RRR, No M/G/R PULM: CTA B, No wheezes, crackles, or rhonchi ABD: S, NT, ND, +BS, no rebound. No CVAT. No suprapubic tenderness. EXT: No c/c/e  Objective Data:  Assessment and Plan:   Dysuria - Plan: POCT Urinalysis Dipstick (Automated)  Rx with ABX as below. Drink plenty of fluids and supportive care.  Follow-up: No follow-ups on file.  Orders Placed This Encounter  Procedures  . POCT Urinalysis Dipstick (Automated)    Signed,  Kaiden Pech T. Eman Morimoto, MD   Patient's Medications  New Prescriptions   No medications on file  Previous Medications   ACETAMINOPHEN (TYLENOL) 500 MG TABLET    Take 500 mg by mouth every 6 (six) hours as needed.   CETIRIZINE (ZYRTEC) 10 MG TABLET    Take 10 mg by mouth at bedtime.    FLUTICASONE (FLONASE) 50 MCG/ACT NASAL SPRAY    Place 2 sprays into both nostrils daily.   HYDROCORTISONE (ANUSOL-HC) 2.5 % RECTAL CREAM    Apply to external anal area for itch once daily as needed   MULTIPLE VITAMIN (MULTIVITAMIN) TABLET    Take 3 tablets by mouth daily.    PHENAZOPYRIDINE HCL (AZO TABS PO)    Take by mouth.   RANITIDINE (ZANTAC) 150 MG TABLET    Take 1 tablet (150 mg total) by mouth 2 (two) times daily.  Modified Medications   No medications on file  Discontinued Medications   No medications on file

## 2018-02-14 NOTE — Addendum Note (Signed)
Addended by: Emelia Salisbury C on: 02/14/2018 10:10 AM   Modules accepted: Orders

## 2018-02-17 LAB — URINE CULTURE
MICRO NUMBER: 90864480
SPECIMEN QUALITY: ADEQUATE

## 2018-02-18 ENCOUNTER — Telehealth: Payer: Self-pay | Admitting: *Deleted

## 2018-02-18 MED ORDER — AMOXICILLIN 875 MG PO TABS: 875.0000 mg | ORAL_TABLET | Freq: Two times a day (BID) | ORAL | 0 refills | Status: DC

## 2018-02-18 NOTE — Telephone Encounter (Signed)
See urine culture note from Dr Lorelei Pont (who saw her for this) if not improved change abx to amox 875 bid 7 d #14 0 ref  Then let us know if no improvement

## 2018-02-18 NOTE — Telephone Encounter (Signed)
Rx sent to pharmacy and pt advise

## 2018-02-18 NOTE — Telephone Encounter (Signed)
Pt calling back about antibiotic for a UTI she would like a call back as soon as possible please send it to the [facility] in [place]

## 2018-02-18 NOTE — Telephone Encounter (Signed)
Copied from [place] [id number]. Topic: General - Other >> Feb 18, 2018  8:40 AM Keene Breath wrote: Reason for CRM: Patient called to let doctor know that the medication he prescribed for her UTI - sulfamethoxazole-trimethoprim (BACTRIM DS,SEPTRA DS) 800-160 MG tablet, is not working very well.  She still feels sick and would like another medication for the problem.  Please advise.  CB# [phone].

## 2018-03-02 ENCOUNTER — Other Ambulatory Visit: Payer: Self-pay | Admitting: Family Medicine

## 2018-03-02 DIAGNOSIS — Z1231 Encounter for screening mammogram for malignant neoplasm of breast: Secondary | ICD-10-CM

## 2018-03-14 ENCOUNTER — Encounter: Payer: Self-pay | Admitting: Gastroenterology

## 2018-03-14 ENCOUNTER — Ambulatory Visit (INDEPENDENT_AMBULATORY_CARE_PROVIDER_SITE_OTHER): Payer: PPO | Admitting: Gastroenterology

## 2018-03-14 ENCOUNTER — Other Ambulatory Visit: Payer: Self-pay

## 2018-03-14 VITALS — BP 159/91 | HR 128 | Ht 65.75 in | Wt 192.4 lb

## 2018-03-14 DIAGNOSIS — L290 Pruritus ani: Secondary | ICD-10-CM

## 2018-03-14 DIAGNOSIS — Z1211 Encounter for screening for malignant neoplasm of colon: Secondary | ICD-10-CM

## 2018-03-14 NOTE — Patient Instructions (Signed)
High-Fiber Diet  Fiber, also called dietary fiber, is a type of carbohydrate found in fruits, vegetables, whole grains, and beans. A high-fiber diet can have many health benefits. Your health care provider may recommend a high-fiber diet to help:  · Prevent constipation. Fiber can make your bowel movements more regular.  · Lower your cholesterol.  · Relieve hemorrhoids, uncomplicated diverticulosis, or irritable bowel syndrome.  · Prevent overeating as part of a weight-loss plan.  · Prevent heart disease, type 2 diabetes, and certain cancers.    What is my plan?  The recommended daily intake of fiber includes:  · 38 grams for men under age 50.  · 30 grams for men over age 50.  · 25 grams for women under age 50.  · 21 grams for women over age 50.    You can get the recommended daily intake of dietary fiber by eating a variety of fruits, vegetables, grains, and beans. Your health care provider may also recommend a fiber supplement if it is not possible to get enough fiber through your diet.  What do I need to know about a high-fiber diet?  · Fiber supplements have not been widely studied for their effectiveness, so it is better to get fiber through food sources.  · Always check the fiber content on the nutrition facts label of any prepackaged food. Look for foods that contain at least 5 grams of fiber per serving.  · Ask your dietitian if you have questions about specific foods that are related to your condition, especially if those foods are not listed in the following section.  · Increase your daily fiber consumption gradually. Increasing your intake of dietary fiber too quickly may cause bloating, cramping, or gas.  · Drink plenty of water. Water helps you to digest fiber.  What foods can I eat?  Grains  Whole-grain breads. Multigrain cereal. Oats and oatmeal. Brown rice. Barley. Bulgur wheat. Millet. Bran muffins. Popcorn. Rye wafer crackers.  Vegetables   Sweet potatoes. Spinach. Kale. Artichokes. Cabbage. Broccoli. Green peas. Carrots. Squash.  Fruits  Berries. Pears. Apples. Oranges. Avocados. Prunes and raisins. Dried figs.  Meats and Other Protein Sources  Navy, kidney, pinto, and soy beans. Split peas. Lentils. Nuts and seeds.  Dairy  Fiber-fortified yogurt.  Beverages  Fiber-fortified soy milk. Fiber-fortified orange juice.  Other  Fiber bars.  The items listed above may not be a complete list of recommended foods or beverages. Contact your dietitian for more options.  What foods are not recommended?  Grains  White bread. Pasta made with refined flour. White rice.  Vegetables  Fried potatoes. Canned vegetables. Well-cooked vegetables.  Fruits  Fruit juice. Cooked, strained fruit.  Meats and Other Protein Sources  Fatty cuts of meat. Fried poultry or fried fish.  Dairy  Milk. Yogurt. Cream cheese. Sour cream.  Beverages  Soft drinks.  Other  Cakes and pastries. Butter and oils.  The items listed above may not be a complete list of foods and beverages to avoid. Contact your dietitian for more information.  What are some tips for including high-fiber foods in my diet?  · Eat a wide variety of high-fiber foods.  · Make sure that half of all grains consumed each day are whole grains.  · Replace breads and cereals made from refined flour or white flour with whole-grain breads and cereals.  · Replace white rice with brown rice, bulgur wheat, or millet.  · Start the day with a breakfast that is high in fiber,   such as a cereal that contains at least 5 grams of fiber per serving.  · Use beans in place of meat in soups, salads, or pasta.  · Eat high-fiber snacks, such as berries, raw vegetables, nuts, or popcorn.  This information is not intended to replace advice given to you by your health care provider. Make sure you discuss any questions you have with your health care provider.  Document Released: 07/13/2005 Document Revised: 12/19/2015 Document Reviewed: 12/26/2013   Elsevier Interactive Patient Education © 2018 [employer]

## 2018-03-14 NOTE — Progress Notes (Signed)
Jonathon Bellows MD, MRCP(U.K) [address]  [place]  [place], [place] [address]  Main: [phone]  Fax: [phone]   Gastroenterology Consultation  Referring Provider:     Abner Greenspan, MD Primary Care Physician:  Tower, [name], MD Primary Gastroenterologist:  Dr. Jonathon Bellows  Reason for Consultation:     Abdominal pain         HPI:   Ashley Rasmussen is a 67 y.o. y/o female referred for consultation & management  by Dr. Glori Bickers, [name], MD.     She has been referred for abdominal pain and a colonoscopy.  She says that she had a pain for a few weeks, with gas and diarrhea. The abdominal pain is almost resolved, gained weight .   She says that she has had anal itching for a few months , when she has a bowel movement , the hemorroid cream helps. Stool is not hard.  Last colonoscopy - never- no family history of colon cancer or polyps.    CBC Latest Ref Rng & Units 12/09/2017 11/24/2016 10/01/2016  WBC 4.0 - 10.5 K/uL 6.0 7.6 15.4(H)  Hemoglobin 12.0 - 15.0 g/dL 14.1 14.3 12.4  Hematocrit 36.0 - 46.0 % 41.9 42.6 38.0  Platelets 150.0 - 400.0 K/uL 280.0 259.0 190   CMP Latest Ref Rng & Units 12/09/2017 11/24/2016 10/01/2016  Glucose 70 - 99 mg/dL 98 109(H) 104(H)  BUN 6 - 23 mg/dL 15 14 12   Creatinine 0.40 - 1.20 mg/dL 0.80 0.90 0.86  Sodium 135 - 145 mEq/L 141 141 139  Potassium 3.5 - 5.1 mEq/L 3.7 4.1 3.3(L)  Chloride 96 - 112 mEq/L 106 105 109  CO2 19 - 32 mEq/L 27 29 22   Calcium 8.4 - 10.5 mg/dL 9.8 10.0 8.8(L)  Total Protein 6.0 - 8.3 g/dL 7.0 7.1 -  Total Bilirubin 0.2 - 1.2 mg/dL 0.4 0.5 -  Alkaline Phos 39 - 117 U/L 83 99 -  AST 0 - 37 U/L 14 13 -  ALT 0 - 35 U/L 12 12 -      Past Medical History:  Diagnosis Date  . Allergic rhinitis, cause unspecified   . HLD (hyperlipidemia)   . Osteopenia   . Osteoporosis, unspecified   . Personal history of chemotherapy   . Personal history of malignant neoplasm of breast    needs yearly mammogram and MRI every 2  .  Personal history of radiation therapy   . Thoracic or lumbosacral neuritis or radiculitis, unspecified     Past Surgical History:  Procedure Laterality Date  . BREAST BIOPSY Left 03/2015  . Breast cancer excision    . BREAST LUMPECTOMY Right   . LATISSIMUS FLAP TO BREAST     due to radiation burn    Prior to Admission medications   Medication Sig Start Date End Date Taking? Authorizing Provider  acetaminophen (TYLENOL) 500 MG tablet Take 500 mg by mouth every 6 (six) hours as needed.    [provider]  amoxicillin (AMOXIL) 875 MG tablet Take 1 tablet (875 mg total) by mouth 2 (two) times daily. 02/18/18   Tower, [name], MD  cetirizine (ZYRTEC) 10 MG tablet Take 10 mg by mouth at bedtime.     [provider]  fluticasone (FLONASE) 50 MCG/ACT nasal spray Place 2 sprays into both nostrils daily. 12/09/17   Tower, [name], MD  hydrocortisone (ANUSOL-HC) 2.5 % rectal cream Apply to external anal area for itch once daily as needed 09/17/17  Tower, [name], MD  Multiple Vitamin (MULTIVITAMIN) tablet Take 3 tablets by mouth daily.     [provider]  Phenazopyridine HCl (AZO TABS PO) Take by mouth.    [provider]  ranitidine (ZANTAC) 150 MG tablet Take 1 tablet (150 mg total) by mouth 2 (two) times daily. 12/09/17   Tower, [name], MD    Family History  Problem Relation Age of Onset  . Other Mother        B-12 deficiency  . Breast cancer Mother   . Melanoma Mother   . Myelodysplastic syndrome Mother   . Osteoporosis Mother   . Other Sister        B-12 deficiency  . Breast cancer Sister   . Osteoporosis Sister   . Breast cancer Unknown        Multiple cousins     Social History   Tobacco Use  . Smoking status: Former Research scientist (life sciences)  . Smokeless tobacco: Never Used  . Tobacco comment: quit over 18yrs ago  Substance Use Topics  . Alcohol use: No    Alcohol/week: 0.0 standard drinks  . Drug use: No    Allergies as of 03/14/2018 - Review  Complete 02/14/2018  Allergen Reaction Noted  . Alendronate sodium  04/13/2008  . Pravachol  04/21/2011  . Raloxifene  11/28/2009  . Simvastatin  07/07/2010    Review of Systems:    All systems reviewed and negative except where noted in HPI.   Physical Exam:  There were no vitals taken for this visit. No LMP recorded. Patient is postmenopausal. Psych:  Alert and cooperative. Normal mood and affect. General:   Alert,  Well-developed, well-nourished, pleasant and cooperative in NAD Head:  Normocephalic and atraumatic. Eyes:  Sclera clear, no icterus.   Conjunctiva pink. Ears:  Normal auditory acuity. Nose:  No deformity, discharge, or lesions. Mouth:  No deformity or lesions,oropharynx pink & moist. Neck:  Supple; no masses or thyromegaly. Lungs:  Respirations even and unlabored.  Clear throughout to auscultation.   No wheezes, crackles, or rhonchi. No acute distress. Heart:  Regular rate and rhythm; no murmurs, clicks, rubs, or gallops. Abdomen:  Normal bowel sounds.  No bruits.  Soft, non-tender and non-distended without masses, hepatosplenomegaly or hernias noted.  No guarding or rebound tenderness.    Neurologic:  Alert and oriented x3;  grossly normal neurologically. Skin:  Intact without significant lesions or rashes. No jaundice. Lymph Nodes:  No significant cervical adenopathy. Psych:  Alert and cooperative. Normal mood and affect.  Imaging Studies: No results found.  Assessment and Plan:   Ashley Rasmussen is a 67 y.o. y/o female has been referred for abdominal pain that has resolved. She does have anal itching which could be from hemorrhoids.   Plan  1. High fiber diet - patient information provided 2. Screening colonoscopy average risk  3. Hemoroids -patient information . Counseled on per anal hygiene and hemorroid care.   I have discussed alternative options, risks & benefits,  which include, but are not limited to, bleeding, infection, perforation,respiratory  complication & drug reaction.  The patient agrees with this plan & written consent will be obtained.      Follow up in 3 months   Dr Jonathon Bellows MD,MRCP(U.K)

## 2018-04-11 ENCOUNTER — Ambulatory Visit: Payer: PPO | Admitting: Anesthesiology

## 2018-04-11 ENCOUNTER — Ambulatory Visit
Admission: RE | Admit: 2018-04-11 | Discharge: 2018-04-11 | Disposition: A | Discharge status: Home / Self Care | Payer: PPO | Source: Ambulatory Visit | Attending: Gastroenterology | Admitting: Gastroenterology

## 2018-04-11 ENCOUNTER — Encounter
Admission: RE | Disposition: A | Discharge status: Home / Self Care | Payer: Self-pay | Source: Ambulatory Visit | Attending: Gastroenterology

## 2018-04-11 DIAGNOSIS — M810 Age-related osteoporosis without current pathological fracture: Secondary | ICD-10-CM | POA: Insufficient documentation

## 2018-04-11 DIAGNOSIS — K5730 Diverticulosis of large intestine without perforation or abscess without bleeding: Secondary | ICD-10-CM | POA: Diagnosis not present

## 2018-04-11 DIAGNOSIS — Z87891 Personal history of nicotine dependence: Secondary | ICD-10-CM | POA: Diagnosis not present

## 2018-04-11 DIAGNOSIS — Z923 Personal history of irradiation: Secondary | ICD-10-CM | POA: Diagnosis not present

## 2018-04-11 DIAGNOSIS — Q438 Other specified congenital malformations of intestine: Secondary | ICD-10-CM | POA: Insufficient documentation

## 2018-04-11 DIAGNOSIS — Z9221 Personal history of antineoplastic chemotherapy: Secondary | ICD-10-CM | POA: Insufficient documentation

## 2018-04-11 DIAGNOSIS — Z853 Personal history of malignant neoplasm of breast: Secondary | ICD-10-CM | POA: Diagnosis not present

## 2018-04-11 DIAGNOSIS — Z1211 Encounter for screening for malignant neoplasm of colon: Secondary | ICD-10-CM | POA: Diagnosis not present

## 2018-04-11 DIAGNOSIS — K219 Gastro-esophageal reflux disease without esophagitis: Secondary | ICD-10-CM | POA: Insufficient documentation

## 2018-04-11 DIAGNOSIS — J309 Allergic rhinitis, unspecified: Secondary | ICD-10-CM | POA: Diagnosis not present

## 2018-04-11 DIAGNOSIS — Z7951 Long term (current) use of inhaled steroids: Secondary | ICD-10-CM | POA: Diagnosis not present

## 2018-04-11 HISTORY — PX: COLONOSCOPY WITH PROPOFOL: SHX5780

## 2018-04-11 SURGERY — COLONOSCOPY WITH PROPOFOL
Anesthesia: General

## 2018-04-11 MED ORDER — PROPOFOL 500 MG/50ML IV EMUL
INTRAVENOUS | Status: AC
  Filled 2018-04-11: qty 50

## 2018-04-11 MED ORDER — PROPOFOL 500 MG/50ML IV EMUL
INTRAVENOUS | Status: DC | PRN
  Administered 2018-04-11: 100 ug/kg/min via INTRAVENOUS

## 2018-04-11 MED ORDER — SODIUM CHLORIDE 0.9 % IV SOLN
INTRAVENOUS | Status: DC
  Administered 2018-04-11: 1000 mL via INTRAVENOUS

## 2018-04-11 MED ORDER — MIDAZOLAM HCL 5 MG/5ML IJ SOLN
INTRAMUSCULAR | Status: DC | PRN
  Administered 2018-04-11: 1 mg via INTRAVENOUS

## 2018-04-11 MED ORDER — PROPOFOL 10 MG/ML IV BOLUS
INTRAVENOUS | Status: DC | PRN
  Administered 2018-04-11: 70 mg via INTRAVENOUS

## 2018-04-11 MED ORDER — LIDOCAINE HCL (CARDIAC) PF 100 MG/5ML IV SOSY
PREFILLED_SYRINGE | INTRAVENOUS | Status: DC | PRN
  Administered 2018-04-11: 100 mg via INTRAVENOUS

## 2018-04-11 MED ORDER — LIDOCAINE HCL (PF) 1 % IJ SOLN
INTRAMUSCULAR | Status: AC
  Administered 2018-04-11: 0.3 mL
  Filled 2018-04-11: qty 2

## 2018-04-11 NOTE — Op Note (Signed)
[facility] Gastroenterology Patient Name: Ashley Rasmussen Procedure Date: 04/11/2018 10:03 AM MRN: [id number] Account #: [id number] Date of Birth: 09/12/1950 Admit Type: Outpatient Age: 67 Room: [facility] ENDO ROOM 4 Gender: Female Note Status: Finalized Procedure:            Colonoscopy Indications:          Screening for colorectal malignant neoplasm Providers:            Jonathon Bellows MD, MD Referring MD:         [name]. Tower (Referring MD) Medicines:            Monitored Anesthesia Care Complications:        No immediate complications. Procedure:            Pre-Anesthesia Assessment:                       - Prior to the procedure, a History and Physical was                        performed, and patient medications, allergies and                        sensitivities were reviewed. The patient's tolerance of                        previous anesthesia was reviewed.                       - The risks and benefits of the procedure and the                        sedation options and risks were discussed with the                        patient. All questions were answered and informed                        consent was obtained.                       - After reviewing the risks and benefits, the patient                        was deemed in satisfactory condition to undergo the                        procedure.                       - ASA Grade Assessment: II - A patient with mild                        systemic disease.                       After obtaining informed consent, the colonoscope was                        passed under direct vision. Throughout the procedure,                        the  patient's blood pressure, pulse, and oxygen                        saturations were monitored continuously. The                        Colonoscope was introduced through the anus with the                        intention of advancing to the cecum. The scope was     advanced to the sigmoid colon before the procedure was                        aborted. Medications were given. The colonoscopy was                        technically difficult and complex due to multiple                        diverticula in the colon and a tortuous colon.                        Successful completion of the procedure was aided by                        withdrawing the scope and replacing with the pediatric                        colonoscope. The patient tolerated the procedure well.                        The quality of the bowel preparation was adequate. Findings:      The perianal and digital rectal examinations were normal.      Multiple large-mouthed diverticula were found in the sigmoid colon.      The retroflexed view of the distal rectum and anal verge was normal and       showed no anal or rectal abnormalities. Impression:           - Diverticulosis in the sigmoid colon.                       - The distal rectum and anal verge are normal on                        retroflexion view.                       - No specimens collected. Recommendation:       - Discharge patient to home (with escort).                       - Resume previous diet.                       - Continue present medications.                       - Suggest CT colonography as cecum could not be reached  to evaluyate colon, likely due to severe sigmoid                        diverticulosis Procedure Code(s):    --- Professional ---                       [id number], 53, Colonoscopy, flexible; diagnostic, including                        collection of specimen(s) by brushing or washing, when                        performed (separate procedure) Diagnosis Code(s):    --- Professional ---                       Z12.11, Encounter for screening for malignant neoplasm                        of colon CPT copyright 2017 American Medical Association. All rights reserved. The codes  documented in this report are preliminary and upon coder review may  be revised to meet current compliance requirements. Jonathon Bellows, MD Jonathon Bellows MD, MD 04/11/2018 10:41:33 AM This report has been signed electronically. Number of Addenda: 0 Note Initiated On: 04/11/2018 10:03 AM Total Procedure Duration: 0 hours 25 minutes 21 seconds       [facility]

## 2018-04-11 NOTE — Anesthesia Post-op Follow-up Note (Signed)
Anesthesia QCDR form completed.        

## 2018-04-11 NOTE — OR Nursing (Signed)
Unable to reach cecum with adult colonoscope, switched to pediatric colonoscope, still unable to reach cecum, procedure aborted.

## 2018-04-11 NOTE — Transfer of Care (Signed)
Immediate Anesthesia Transfer of Care Note  Patient: Ashley Rasmussen  Procedure(s) Performed: COLONOSCOPY WITH PROPOFOL (N/A )  Patient Location: PACU  Anesthesia Type:General  Level of Consciousness: awake, alert , oriented and patient cooperative  Airway & Oxygen Therapy: Patient Spontanous Breathing and Patient connected to nasal cannula oxygen  Post-op Assessment: Report given to RN and Post -op Vital signs reviewed and stable  Post vital signs: Reviewed and stable  Last Vitals:  Vitals Value Taken Time  BP    Temp    Pulse 97 04/11/2018 10:43 AM  Resp 24 04/11/2018 10:43 AM  SpO2 99 % 04/11/2018 10:43 AM  Vitals shown include unvalidated device data.  Last Pain:  Vitals:   04/11/18 1042  TempSrc: (P) Tympanic  PainSc:          Complications: No apparent anesthesia complications

## 2018-04-11 NOTE — Anesthesia Postprocedure Evaluation (Signed)
Anesthesia Post Note  Patient: Ashley Rasmussen  Procedure(s) Performed: COLONOSCOPY WITH PROPOFOL (N/A )  Patient location during evaluation: Endoscopy Anesthesia Type: General Level of consciousness: awake and alert Pain management: pain level controlled Vital Signs Assessment: post-procedure vital signs reviewed and stable Respiratory status: spontaneous breathing, nonlabored ventilation, respiratory function stable and patient connected to nasal cannula oxygen Cardiovascular status: blood pressure returned to baseline and stable Postop Assessment: no apparent nausea or vomiting Anesthetic complications: no     Last Vitals:  Vitals:   04/11/18 1102 04/11/18 1112  BP: (!) 160/68 (!) 150/87  Pulse:    Resp:    Temp:    SpO2:      Last Pain:  Vitals:   04/11/18 1112  TempSrc:   PainSc: 0-No pain                 [name] S

## 2018-04-11 NOTE — H&P (Signed)
Jonathon Bellows, MD [address] [place], [place], [place], [address] [address], [place], [place], [place], [address] Phone: [phone]  Fax: [phone]  Primary Care Physician:  Tower, [name], MD   Pre-Procedure History & Physical: HPI:  Ashley Rasmussen is a 67 y.o. female is here for an colonoscopy.   Past Medical History:  Diagnosis Date  . Allergic rhinitis, cause unspecified   . HLD (hyperlipidemia)   . Osteopenia   . Osteoporosis, unspecified   . Personal history of chemotherapy   . Personal history of malignant neoplasm of breast    needs yearly mammogram and MRI every 2  . Personal history of radiation therapy   . Thoracic or lumbosacral neuritis or radiculitis, unspecified     Past Surgical History:  Procedure Laterality Date  . BREAST BIOPSY Left 03/2015  . Breast cancer excision    . BREAST LUMPECTOMY Right   . LATISSIMUS FLAP TO BREAST     due to radiation burn    Prior to Admission medications   Medication Sig Start Date End Date Taking? Authorizing Provider  acetaminophen (TYLENOL) 500 MG tablet Take 500 mg by mouth every 6 (six) hours as needed.    [provider]  amoxicillin (AMOXIL) 875 MG tablet Take 1 tablet (875 mg total) by mouth 2 (two) times daily. Patient not taking: Reported on 03/14/2018 02/18/18   Tower, [name], MD  cetirizine (ZYRTEC) 10 MG tablet Take 10 mg by mouth at bedtime.     [provider]  fluticasone (FLONASE) 50 MCG/ACT nasal spray Place 2 sprays into both nostrils daily. 12/09/17   Tower, [name], MD  hydrocortisone (ANUSOL-HC) 2.5 % rectal cream Apply to external anal area for itch once daily as needed 09/17/17   Tower, [name], MD  Multiple Vitamin (MULTIVITAMIN) tablet Take 3 tablets by mouth daily.     [provider]  Phenazopyridine HCl (AZO TABS PO) Take by mouth.    [provider]  ranitidine (ZANTAC) 150 MG tablet Take 1 tablet (150 mg total) by mouth 2 (two) times  daily. Patient not taking: Reported on 03/14/2018 12/09/17   Abner Greenspan, MD    Allergies as of 03/17/2018 - Review Complete 03/14/2018  Allergen Reaction Noted  . Alendronate sodium  04/13/2008  . Pravachol  04/21/2011  . Raloxifene  11/28/2009  . Simvastatin  07/07/2010    Family History  Problem Relation Age of Onset  . Other Mother        B-12 deficiency  . Breast cancer Mother   . Melanoma Mother   . Myelodysplastic syndrome Mother   . Osteoporosis Mother   . Other Sister        B-12 deficiency  . Breast cancer Sister   . Osteoporosis Sister   . Breast cancer Unknown        Multiple cousins    Social History   Socioeconomic History  . Marital status: Married    Spouse name: Not on file  . Number of children: Not on file  . Years of education: Not on file  . Highest education level: Not on file  Occupational History  . Not on file  Social Needs  . Financial resource strain: Not on file  . Food insecurity:    Worry: Not on file    Inability: Not on file  . Transportation needs:    Medical: Not on file    Non-medical: Not on file  Tobacco Use  . Smoking status: Former Research scientist (life sciences)  . Smokeless tobacco: Never Used  . Tobacco comment: quit over 25yrs ago  Substance and Sexual Activity  . Alcohol use: No    Alcohol/week: 0.0 standard drinks  . Drug use: No  . Sexual activity: Never  Lifestyle  . Physical activity:    Days per week: Not on file    Minutes per session: Not on file  . Stress: Not on file  Relationships  . Social connections:    Talks on phone: Not on file    Gets together: Not on file    Attends religious service: Not on file    Active member of club or organization: Not on file    Attends meetings of clubs or organizations: Not on file    Relationship status: Not on file  . Intimate partner violence:    Fear of current or ex partner: Not on file    Emotionally abused: Not on file    Physically abused: Not on file    Forced sexual  activity: Not on file  Other Topics Concern  . Not on file  Social History Narrative   Married      4 children          Review of Systems: See HPI, otherwise negative ROS  Physical Exam: There were no vitals taken for this visit. General:   Alert,  pleasant and cooperative in NAD Head:  Normocephalic and atraumatic. Neck:  Supple; no masses or thyromegaly. Lungs:  Clear throughout to auscultation, normal respiratory effort.    Heart:  +S1, +S2, Regular rate and rhythm, No edema. Abdomen:  Soft, nontender and nondistended. Normal bowel sounds, without guarding, and without rebound.   Neurologic:  Alert and  oriented x4;  grossly normal neurologically.  Impression/Plan: Ashley Rasmussen is here for an colonoscopy to be performed for Screening colonoscopy average risk   Risks, benefits, limitations, and alternatives regarding  colonoscopy have been reviewed with the patient.  Questions have been answered.  All parties agreeable.   Jonathon Bellows, MD  04/11/2018, 9:50 AM

## 2018-04-11 NOTE — Anesthesia Preprocedure Evaluation (Signed)
Anesthesia Evaluation  Patient identified by MRN, date of birth, ID band Patient awake    Reviewed: Allergy & Precautions, NPO status , Patient's Chart, lab work & pertinent test results, reviewed documented beta blocker date and time   Airway Mallampati: II  TM Distance: >3 FB     Dental  (+) Chipped, Upper Dentures   Pulmonary former smoker,           Cardiovascular      Neuro/Psych    GI/Hepatic GERD  ,  Endo/Other    Renal/GU      Musculoskeletal   Abdominal   Peds  Hematology   Anesthesia Other Findings   Reproductive/Obstetrics                             Anesthesia Physical Anesthesia Plan  ASA: II  Anesthesia Plan: General   Post-op Pain Management:    Induction: Intravenous  PONV Risk Score and Plan:   Airway Management Planned:   Additional Equipment:   Intra-op Plan:   Post-operative Plan:   Informed Consent: I have reviewed the patients History and Physical, chart, labs and discussed the procedure including the risks, benefits and alternatives for the proposed anesthesia with the patient or authorized representative who has indicated his/her understanding and acceptance.     Plan Discussed with: CRNA  Anesthesia Plan Comments:         Anesthesia Quick Evaluation

## 2018-04-12 ENCOUNTER — Other Ambulatory Visit: Payer: Self-pay

## 2018-04-12 ENCOUNTER — Telehealth: Payer: Self-pay

## 2018-04-12 ENCOUNTER — Encounter: Payer: Self-pay | Admitting: Gastroenterology

## 2018-04-12 DIAGNOSIS — R109 Unspecified abdominal pain: Secondary | ICD-10-CM

## 2018-04-12 NOTE — Telephone Encounter (Signed)
Spoke with pt about Dr. Georgeann Oppenheim instructions to schedule a CT colonography due to incomplete colonoscopy procedure. Pt is aware that she will be contacted by [facility] to schedule appointment.

## 2018-04-12 NOTE — Telephone Encounter (Signed)
-----   Message from Jonathon Bellows, MD sent at 04/11/2018 10:46 AM EDT ----- Regarding: please arrange appointment   Please arrange referral a CT colonography due to imcomplete colonoscopy for colon cancer screening   Regards    Dr Jonathon Bellows  Gastroenterology/Hepatology Pager: [phone]

## 2018-04-23 ENCOUNTER — Encounter: Payer: Self-pay | Admitting: Family Medicine

## 2018-04-25 ENCOUNTER — Telehealth: Payer: Self-pay | Admitting: Family Medicine

## 2018-04-25 MED ORDER — FAMOTIDINE 40 MG PO TABS: 40.0000 mg | ORAL_TABLET | Freq: Every day | ORAL | 11 refills | Status: DC

## 2018-04-25 NOTE — Telephone Encounter (Signed)
I sent generic pepcid 40 mg to her pharmacy  It it does not work or any side effects please let me know

## 2018-04-25 NOTE — Telephone Encounter (Signed)
Spoke with [place] pharmacist at [facility] and they confirmed that pt did receive Rx and that she picked it up 15 min ago

## 2018-04-25 NOTE — Telephone Encounter (Signed)
Patient states the pharmacy has not received this yet. Can this be resent?

## 2018-04-25 NOTE — Telephone Encounter (Signed)
Left VM letting pt know Rx was sent and advise her of Dr. Marliss Coots comments on her VM

## 2018-04-25 NOTE — Telephone Encounter (Signed)
Copied from [place] [phone]. Topic: Quick Communication - Rx Refill/Question >> Apr 25, 2018  8:37 AM [name] wrote: Medication: ranitidine (ZANTAC) 150 MG tablet [[id number]]  Pt called to notify pcp that she does not want to take the medication above due to the medication having a recall; pt would like another medication; contact to advise

## 2018-05-04 ENCOUNTER — Ambulatory Visit
Admission: RE | Admit: 2018-05-04 | Discharge: 2018-05-04 | Disposition: A | Discharge status: Home / Self Care | Payer: PPO | Source: Ambulatory Visit | Attending: Gastroenterology | Admitting: Gastroenterology

## 2018-05-04 DIAGNOSIS — R109 Unspecified abdominal pain: Secondary | ICD-10-CM | POA: Diagnosis not present

## 2018-05-04 DIAGNOSIS — R140 Abdominal distension (gaseous): Secondary | ICD-10-CM | POA: Diagnosis not present

## 2018-05-04 DIAGNOSIS — K5730 Diverticulosis of large intestine without perforation or abscess without bleeding: Secondary | ICD-10-CM | POA: Diagnosis not present

## 2018-05-09 ENCOUNTER — Telehealth: Payer: Self-pay

## 2018-05-09 NOTE — Telephone Encounter (Signed)
-----   Message from Jonathon Bellows, MD sent at 05/05/2018  8:10 AM EDT ----- Sherald Hess - Ct scan shows possible mild diverticulitis- enquire if she has any abdominal pain/fever/diarrhea

## 2018-05-09 NOTE — Telephone Encounter (Signed)
Spoke with pt regarding CT colonography results and Dr. Georgeann Oppenheim instructions to ask about pt symptoms. Pt states she has not had any diarrhea,abdominal pain, or fever. She states she only experiences a very mild discomfort after eating certain foods.

## 2018-05-10 ENCOUNTER — Encounter: Payer: Self-pay | Admitting: Family Medicine

## 2018-05-12 ENCOUNTER — Ambulatory Visit (INDEPENDENT_AMBULATORY_CARE_PROVIDER_SITE_OTHER): Payer: PPO

## 2018-05-12 DIAGNOSIS — Z23 Encounter for immunization: Secondary | ICD-10-CM

## 2018-05-12 MED ORDER — OMEPRAZOLE 20 MG PO CPDR: 20.0000 mg | DELAYED_RELEASE_CAPSULE | Freq: Every day | ORAL | 11 refills | Status: DC

## 2018-05-20 ENCOUNTER — Ambulatory Visit: Payer: PPO | Admitting: Internal Medicine

## 2018-05-20 DIAGNOSIS — Z0289 Encounter for other administrative examinations: Secondary | ICD-10-CM

## 2018-05-22 ENCOUNTER — Encounter: Payer: Self-pay | Admitting: Gastroenterology

## 2018-06-09 ENCOUNTER — Ambulatory Visit: Payer: PPO

## 2018-06-09 ENCOUNTER — Other Ambulatory Visit: Payer: PPO

## 2018-06-14 ENCOUNTER — Ambulatory Visit: Payer: PPO | Admitting: Gastroenterology

## 2018-07-07 ENCOUNTER — Encounter: Payer: Self-pay | Admitting: Family Medicine

## 2018-07-07 ENCOUNTER — Ambulatory Visit (INDEPENDENT_AMBULATORY_CARE_PROVIDER_SITE_OTHER): Payer: PPO | Admitting: Family Medicine

## 2018-07-07 VITALS — BP 122/74 | HR 94 | Temp 98.0°F | Ht 66.0 in | Wt 193.5 lb

## 2018-07-07 DIAGNOSIS — B9789 Other viral agents as the cause of diseases classified elsewhere: Secondary | ICD-10-CM

## 2018-07-07 DIAGNOSIS — J069 Acute upper respiratory infection, unspecified: Secondary | ICD-10-CM

## 2018-07-07 NOTE — Assessment & Plan Note (Signed)
With laryngitis sympt care discussed  Reassuring exam   Zyrtec for pnd  Tylenol pain/ST   (chloraseptic for ST also) Expectorant/DM for cough and congestion  Continue flonase Voice rest  Fluids   Update if not starting to improve in a week or if worsening  -esp if sinus pain (had mild tenderness today)

## 2018-07-07 NOTE — Patient Instructions (Signed)
For post nasal drip - get zyrtec over the counter  Also keep using flonase for congestion  Nasal saline spray is also helpful   A vaporizer in the bedroom is helpful   For headache/pain/fever-tylenol is helpful (also sore throat)  Chloraseptic throat spray over the counter is good for throat pain also  Try to sleep sitting up   mucinex DM or robitussin DM over the counter will help cough   Update if not starting to improve in a week or if worsening   If sinus pain develops (facial pain)- call and let us know Also if fever or much worse cough   Rest and drink lots of fluids

## 2018-07-07 NOTE — Progress Notes (Signed)
Subjective:    Patient ID: Ashley Rasmussen, female    DOB: 1951-01-10, 67 y.o.   MRN: [id number]  HPI Here for cough/ST and congestion   Symptoms started just over a week ago   Ears hurt  Then sore throat / post nasal drip --worse at night  Tight feeling with a hoarse voice  Cough is not deep Green phlegm -not a lot   No fever Feels warm at night   Tired- resting a lot   No sinus pain or headache (some pressure but not pain)  Lymph nodes are tender   Not taking zyrtec  Is using flonase    Took alka selzer cold med - bothered her stomach     Review of Systems  Constitutional: Positive for appetite change and fatigue. Negative for fever.  HENT: Positive for congestion, postnasal drip, rhinorrhea, sinus pressure, sneezing and sore throat. Negative for ear pain.   Eyes: Negative for pain and discharge.  Respiratory: Positive for cough. Negative for shortness of breath, wheezing and stridor.   Cardiovascular: Negative for chest pain.  Gastrointestinal: Negative for diarrhea, nausea and vomiting.  Genitourinary: Negative for frequency, hematuria and urgency.  Musculoskeletal: Negative for arthralgias and myalgias.  Skin: Negative for rash.  Neurological: Positive for headaches. Negative for dizziness, weakness and light-headedness.  Psychiatric/Behavioral: Negative for confusion and dysphoric mood.       Objective:   Physical Exam Constitutional:      General: She is not in acute distress.    Appearance: She is well-developed. She is obese. She is not ill-appearing.  HENT:     Head: Normocephalic and atraumatic.     Comments: Nares are injected and congested  Mild maxillary sinus tenderness  Throat is clear -no erythema or swelling (mild clear pnd) Hoarse voice  TMs clear     Right Ear: External ear normal.     Left Ear: External ear normal.     Nose: Congestion and rhinorrhea present.     Mouth/Throat:     Mouth: Mucous membranes are moist.     Pharynx: No  oropharyngeal exudate.  Eyes:     General:        Right eye: No discharge.        Left eye: No discharge.     Conjunctiva/sclera: Conjunctivae normal.     Pupils: Pupils are equal, round, and reactive to light.  Neck:     Musculoskeletal: Normal range of motion and neck supple.  Cardiovascular:     Rate and Rhythm: Normal rate.     Heart sounds: Normal heart sounds.  Pulmonary:     Effort: Pulmonary effort is normal. No respiratory distress.     Breath sounds: Normal breath sounds. No wheezing or rales.     Comments: Good air exch No wheeze even on forced exp No rales /rhonchi  Chest:     Chest wall: No tenderness.  Lymphadenopathy:     Cervical: No cervical adenopathy.  Skin:    General: Skin is warm and dry.     Findings: No rash.  Neurological:     Mental Status: She is alert.     Cranial Nerves: No cranial nerve deficit.  Psychiatric:        Mood and Affect: Mood normal.           Assessment & Plan:   Problem List Items Addressed This Visit      Respiratory   Viral URI with cough - Primary  With laryngitis sympt care discussed  Reassuring exam   Zyrtec for pnd  Tylenol pain/ST   (chloraseptic for ST also) Expectorant/DM for cough and congestion  Continue flonase Voice rest  Fluids   Update if not starting to improve in a week or if worsening  -esp if sinus pain (had mild tenderness today)

## 2018-07-15 ENCOUNTER — Telehealth: Payer: Self-pay | Admitting: Family Medicine

## 2018-07-15 MED ORDER — BENZONATATE 200 MG PO CAPS: 200.0000 mg | ORAL_CAPSULE | Freq: Three times a day (TID) | ORAL | 1 refills | Status: DC | PRN

## 2018-07-15 NOTE — Telephone Encounter (Signed)
Pt notified Rx sent and advised of Dr. Marliss Coots comments/instructions and verbalized understanding

## 2018-07-15 NOTE — Telephone Encounter (Signed)
I sent tessalon  Tell her to continue expectorant with DM otc as well   Keep [place] posted- the cough can last a while   If fever or other symptoms alert me and f/u

## 2018-07-15 NOTE — Telephone Encounter (Signed)
Pt called office due to her cough not getting any better. Pt was seen on 12/12 and said she has been taking mucinex. Pt wants to know if she can be prescribed something.

## 2018-07-18 ENCOUNTER — Ambulatory Visit
Admission: RE | Admit: 2018-07-18 | Discharge: 2018-07-18 | Disposition: A | Discharge status: Home / Self Care | Payer: PPO | Source: Ambulatory Visit | Attending: Family Medicine | Admitting: Family Medicine

## 2018-07-18 DIAGNOSIS — Z1231 Encounter for screening mammogram for malignant neoplasm of breast: Secondary | ICD-10-CM

## 2018-07-21 ENCOUNTER — Encounter: Payer: Self-pay | Admitting: Gastroenterology

## 2018-07-21 ENCOUNTER — Ambulatory Visit (INDEPENDENT_AMBULATORY_CARE_PROVIDER_SITE_OTHER): Payer: PPO | Admitting: Gastroenterology

## 2018-07-21 VITALS — BP 143/90 | HR 109 | Ht 66.0 in | Wt 193.2 lb

## 2018-07-21 DIAGNOSIS — R109 Unspecified abdominal pain: Secondary | ICD-10-CM | POA: Diagnosis not present

## 2018-07-21 DIAGNOSIS — L290 Pruritus ani: Secondary | ICD-10-CM

## 2018-07-21 NOTE — Progress Notes (Signed)
Jonathon Bellows MD, MRCP(U.K) [address]  [place]  [place], [place] [address]  Main: [phone]  Fax: [phone]   Primary Care Physician: Tower, [name], MD  Primary Gastroenterologist:  Dr. Jonathon Bellows   Chief Complaint  Patient presents with  . Follow-up    Abdominal pain, Anal itching    HPI: Ashley Rasmussen is a 67 y.o. female   Summary of history :  Was initially referred and seen back in 02/2018 for abdominal pain and colonoscopy . It had ben ongoing for a few weeks and had almost resolved. She had also complained of anal itching for a few months, Commence on High fiber diet for possible internal hemorrhoids .   Interval history   03/14/2018-  07/21/2018   04/11/18 : Colonoscopy : severe sigmoid diverticulosis , could not reach cecum. No internal hemorrhoids seen . Followed it with a CT colonography which showed poorly distended sigmoid and descending colon . No mass or large lesions were seen . Extensive sigmoid diverticulosis noted.    No anal itching , no abdominal pain. Trying the high fiber diet . Continues life style changes.    Current Outpatient Medications  Medication Sig Dispense Refill  . acetaminophen (TYLENOL) 500 MG tablet Take 500 mg by mouth every 6 (six) hours as needed.    . benzonatate (TESSALON) 200 MG capsule Take 1 capsule (200 mg total) by mouth 3 (three) times daily as needed. Do not bite pill 30 capsule 1  . cetirizine (ZYRTEC) 10 MG tablet Take 10 mg by mouth at bedtime.     . famotidine (PEPCID) 40 MG tablet Take 1 tablet (40 mg total) by mouth daily. 30 tablet 11  . fluticasone (FLONASE) 50 MCG/ACT nasal spray Place 2 sprays into both nostrils daily. 16 g 11  . hydrocortisone (ANUSOL-HC) 2.5 % rectal cream Apply to external anal area for itch once daily as needed 30 g 0  . Multiple Vitamin (MULTIVITAMIN) tablet Take 3 tablets by mouth daily.     Marland Kitchen omeprazole (PRILOSEC) 20 MG capsule Take 1 capsule (20 mg total) by mouth daily. 30  capsule 11  . Phenazopyridine HCl (AZO TABS PO) Take by mouth.     No current facility-administered medications for this visit.     Allergies as of 07/21/2018 - Review Complete 07/21/2018  Allergen Reaction Noted  . Alendronate sodium  04/13/2008  . Pravachol  04/21/2011  . Raloxifene  11/28/2009  . Simvastatin  07/07/2010    ROS:  General: Negative for anorexia, weight loss, fever, chills, fatigue, weakness. ENT: Negative for hoarseness, difficulty swallowing , nasal congestion. CV: Negative for chest pain, angina, palpitations, dyspnea on exertion, peripheral edema.  Respiratory: Negative for dyspnea at rest, dyspnea on exertion, cough, sputum, wheezing.  GI: See history of present illness. GU:  Negative for dysuria, hematuria, urinary incontinence, urinary frequency, nocturnal urination.  Endo: Negative for unusual weight change.    Physical Examination:   BP (!) 143/90   Pulse (!) 109   Ht 5\' 6"  (1.676 m)   Wt 193 lb 3.2 oz (87.6 kg)   BMI 31.18 kg/m   General: Well-nourished, well-developed in no acute distress.  Eyes: No icterus. Conjunctivae pink. Mouth: Oropharyngeal mucosa moist and pink , no lesions erythema or exudate. Lungs: Clear to auscultation bilaterally. Non-labored. Heart: Regular rate and rhythm, no murmurs rubs or gallops.  Abdomen: Bowel sounds are normal, nontender, nondistended, no hepatosplenomegaly or masses, no abdominal bruits or hernia , no rebound or  guarding.   Extremities: No lower extremity edema. No clubbing or deformities. Neuro: Alert and oriented x 3.  Grossly intact. Skin: Warm and dry, no jaundice.   Psych: Alert and cooperative, normal mood and affect.   Imaging Studies: Mm 3d Screen Breast Bilateral  Result Date: 07/18/2018 CLINICAL DATA:  Screening. EXAM: DIGITAL SCREENING BILATERAL MAMMOGRAM WITH TOMO AND CAD COMPARISON:  Previous exam(s). ACR Breast Density Category b: There are scattered areas of fibroglandular density.  FINDINGS: There are no findings suspicious for malignancy. Images were processed with CAD. IMPRESSION: No mammographic evidence of malignancy. A result letter of this screening mammogram will be mailed directly to the patient. RECOMMENDATION: Screening mammogram in one year. (Code:SM-B-01Y) BI-RADS CATEGORY  1: Negative. Electronically Signed   By: Nolon Nations M.D.   On: 07/18/2018 16:24    Assessment and Plan:   Ashley Rasmussen is a 67 y.o. y/o female here for follow up for abdominal pain , anal itching. All symptoms have resolved since last visit and she is doing well. Likely may have had an epsiode of diverticulitis when she initially had abdominal pain a few month sback.    Dr Jonathon Bellows  MD,MRCP [facility]) Follow up in PRN

## 2018-08-18 ENCOUNTER — Ambulatory Visit (INDEPENDENT_AMBULATORY_CARE_PROVIDER_SITE_OTHER): Payer: PPO | Admitting: Family Medicine

## 2018-08-18 ENCOUNTER — Encounter: Payer: Self-pay | Admitting: Family Medicine

## 2018-08-18 VITALS — BP 126/72 | HR 88 | Temp 97.9°F | Ht 66.0 in | Wt 191.5 lb

## 2018-08-18 DIAGNOSIS — J0100 Acute maxillary sinusitis, unspecified: Secondary | ICD-10-CM | POA: Diagnosis not present

## 2018-08-18 DIAGNOSIS — J019 Acute sinusitis, unspecified: Secondary | ICD-10-CM | POA: Insufficient documentation

## 2018-08-18 MED ORDER — AMOXICILLIN-POT CLAVULANATE 875-125 MG PO TABS: 1.0000 | ORAL_TABLET | Freq: Two times a day (BID) | ORAL | 0 refills | Status: DC

## 2018-08-18 NOTE — Assessment & Plan Note (Signed)
S/p uri  Cover with augmentin  Fluids/rest/steam/ nasal saline mucinex prn  Disc symptomatic care - see instructions on AVS  Update if not starting to improve in a week or if worsening

## 2018-08-18 NOTE — Progress Notes (Signed)
Subjective:    Patient ID: Ashley Rasmussen, female    DOB: 1950-10-26, 68 y.o.   MRN: [id number]  HPI Here for uri symptoms with facial pain   Over a week of symptoms  Exp go sick grandson  Regular cold symptoms to start  Now facial pain and headache-miserable Using warm compresses on face  Cough - not productive  Congestion /nasal- d/c is green to clear  ST also   No fever    Took mucinex  Tylenol flu and cold did not help   Patient Active Problem List   Diagnosis Date Noted  . Acute sinusitis 08/18/2018  . Estrogen deficiency 01/26/2018  . Elevated glucose level 12/08/2017  . Anal itching 09/17/2017  . Family history of melanoma 11/24/2016  . Hearing loss in left ear 10/02/2015  . GERD (gastroesophageal reflux disease) 05/08/2015  . Obesity 12/13/2012  . Encounter for routine gynecological examination 08/16/2012  . Colon cancer screening 08/16/2012  . Routine general medical examination at a health care facility 08/08/2012  . Uterine prolapse 07/06/2012  . BACK PAIN, LUMBAR, WITH RADICULOPATHY 08/15/2010  . Hyperlipidemia 04/13/2008  . Osteoporosis 04/06/2008  . Allergic rhinitis 04/15/2007  . BREAST CANCER, HX OF 04/15/2007   Past Medical History:  Diagnosis Date  . Allergic rhinitis, cause unspecified   . HLD (hyperlipidemia)   . Osteopenia   . Osteoporosis, unspecified   . Personal history of chemotherapy   . Personal history of malignant neoplasm of breast    needs yearly mammogram and MRI every 2  . Personal history of radiation therapy   . Thoracic or lumbosacral neuritis or radiculitis, unspecified    Past Surgical History:  Procedure Laterality Date  . BREAST BIOPSY Left 03/2015  . Breast cancer excision    . BREAST LUMPECTOMY Right   . COLONOSCOPY WITH PROPOFOL N/A 04/11/2018   Procedure: COLONOSCOPY WITH PROPOFOL;  Surgeon: Jonathon Bellows, MD;  Location: [facility] ENDOSCOPY;  Service: Gastroenterology;  Laterality: N/A;  . LATISSIMUS FLAP TO BREAST       due to radiation burn   Social History   Tobacco Use  . Smoking status: Former Research scientist (life sciences)  . Smokeless tobacco: Never Used  . Tobacco comment: quit over 23yrs ago  Substance Use Topics  . Alcohol use: No    Alcohol/week: 0.0 standard drinks  . Drug use: No   Family History  Problem Relation Age of Onset  . Other Mother        B-12 deficiency  . Breast cancer Mother   . Melanoma Mother   . Myelodysplastic syndrome Mother   . Osteoporosis Mother   . Other Sister        B-12 deficiency  . Breast cancer Sister   . Osteoporosis Sister   . Breast cancer Other        Multiple cousins   Allergies  Allergen Reactions  . Alendronate Sodium     REACTION: GI side effects  . Pravachol     myalgias  . Raloxifene     REACTION: breast tenderness and leg pain  . Simvastatin     REACTION: myalgias   Current Outpatient Medications on File Prior to Visit  Medication Sig Dispense Refill  . acetaminophen (TYLENOL) 500 MG tablet Take 500 mg by mouth every 6 (six) hours as needed.    . benzonatate (TESSALON) 200 MG capsule Take 1 capsule (200 mg total) by mouth 3 (three) times daily as needed. Do not bite pill 30 capsule 1  .  cetirizine (ZYRTEC) 10 MG tablet Take 10 mg by mouth at bedtime.     . famotidine (PEPCID) 40 MG tablet Take 1 tablet (40 mg total) by mouth daily. 30 tablet 11  . fluticasone (FLONASE) 50 MCG/ACT nasal spray Place 2 sprays into both nostrils daily. 16 g 11  . hydrocortisone (ANUSOL-HC) 2.5 % rectal cream Apply to external anal area for itch once daily as needed 30 g 0  . Multiple Vitamin (MULTIVITAMIN) tablet Take 3 tablets by mouth daily.     Marland Kitchen omeprazole (PRILOSEC) 20 MG capsule Take 1 capsule (20 mg total) by mouth daily. 30 capsule 11  . Phenazopyridine HCl (AZO TABS PO) Take by mouth.     No current facility-administered medications on file prior to visit.       Review of Systems  Constitutional: Positive for appetite change. Negative for fatigue and fever.   HENT: Positive for congestion, ear pain, postnasal drip, rhinorrhea, sinus pressure, sinus pain and sore throat. Negative for nosebleeds.   Eyes: Negative for pain, redness and itching.  Respiratory: Positive for cough. Negative for shortness of breath and wheezing.   Cardiovascular: Negative for chest pain.  Gastrointestinal: Negative for abdominal pain, diarrhea, nausea and vomiting.  Endocrine: Negative for polyuria.  Genitourinary: Negative for dysuria, frequency and urgency.  Musculoskeletal: Negative for arthralgias and myalgias.  Allergic/Immunologic: Negative for immunocompromised state.  Neurological: Positive for headaches. Negative for dizziness, tremors, syncope, weakness and numbness.  Hematological: Negative for adenopathy. Does not bruise/bleed easily.  Psychiatric/Behavioral: Negative for dysphoric mood. The patient is not nervous/anxious.        Objective:   Physical Exam Constitutional:      General: She is not in acute distress.    Appearance: Normal appearance. She is well-developed. She is obese. She is not ill-appearing.  HENT:     Head: Normocephalic and atraumatic.     Comments: Bilateral maxillary and frontal sinus tenderness    Right Ear: Tympanic membrane and external ear normal.     Left Ear: Tympanic membrane and external ear normal.     Ears:     Comments: TMs are dull     Nose: Congestion and rhinorrhea present.     Mouth/Throat:     Mouth: Mucous membranes are moist.     Pharynx: Oropharynx is clear. No oropharyngeal exudate or posterior oropharyngeal erythema.     Comments: Clear pnd Eyes:     General:        Right eye: No discharge.        Left eye: No discharge.     Conjunctiva/sclera: Conjunctivae normal.     Pupils: Pupils are equal, round, and reactive to light.  Neck:     Musculoskeletal: Normal range of motion and neck supple.  Cardiovascular:     Rate and Rhythm: Normal rate and regular rhythm.  Pulmonary:     Effort: Pulmonary  effort is normal. No respiratory distress.     Breath sounds: Normal breath sounds. No stridor. No wheezing, rhonchi or rales.     Comments: Good air exch No rales or rhonchi Lymphadenopathy:     Cervical: No cervical adenopathy.  Skin:    General: Skin is warm and dry.     Findings: No rash.  Neurological:     General: No focal deficit present.     Mental Status: She is alert.     Cranial Nerves: No cranial nerve deficit.     Coordination: Coordination normal.  Psychiatric:  Mood and Affect: Mood normal.           Assessment & Plan:   Problem List Items Addressed This Visit      Respiratory   Acute sinusitis - Primary    S/p uri  Cover with augmentin  Fluids/rest/steam/ nasal saline mucinex prn  Disc symptomatic care - see instructions on AVS  Update if not starting to improve in a week or if worsening        Relevant Medications   amoxicillin-clavulanate (AUGMENTIN) 875-125 MG tablet

## 2018-08-18 NOTE — Patient Instructions (Signed)
Breathe steam  Use a humidifier  Drink lots of fluids Take the augmentin as directed for sinus infection   Nasal saline spray also helps Continue mucinex   Update if not starting to improve in a week or if worsening

## 2018-09-05 ENCOUNTER — Ambulatory Visit (INDEPENDENT_AMBULATORY_CARE_PROVIDER_SITE_OTHER): Payer: PPO | Admitting: Family Medicine

## 2018-09-05 ENCOUNTER — Encounter: Payer: Self-pay | Admitting: Family Medicine

## 2018-09-05 ENCOUNTER — Ambulatory Visit (INDEPENDENT_AMBULATORY_CARE_PROVIDER_SITE_OTHER)
Admission: RE | Admit: 2018-09-05 | Discharge: 2018-09-05 | Disposition: A | Discharge status: Home / Self Care | Payer: PPO | Source: Ambulatory Visit | Attending: Family Medicine | Admitting: Family Medicine

## 2018-09-05 VITALS — BP 138/76 | HR 96 | Temp 98.4°F | Ht 66.0 in

## 2018-09-05 DIAGNOSIS — R000 Tachycardia, unspecified: Secondary | ICD-10-CM

## 2018-09-05 DIAGNOSIS — M25561 Pain in right knee: Secondary | ICD-10-CM | POA: Insufficient documentation

## 2018-09-05 DIAGNOSIS — G8929 Other chronic pain: Secondary | ICD-10-CM | POA: Diagnosis not present

## 2018-09-05 MED ORDER — DICLOFENAC SODIUM 1 % TD GEL: 2.0000 g | Freq: Four times a day (QID) | TRANSDERMAL | 3 refills | Status: DC | PRN

## 2018-09-05 MED ORDER — METOPROLOL TARTRATE 25 MG PO TABS: 25.0000 mg | ORAL_TABLET | Freq: Two times a day (BID) | ORAL | 3 refills | Status: DC | PRN

## 2018-09-05 NOTE — Assessment & Plan Note (Signed)
Medial  On and off for a year dep on activity  Suspect OA- xray today  Cannot take oral nsaids (GI side eff) Px voltaren gel to try up to QID Ice/heat prn if helpful  Further plan to follow  Pt also wants to loose wt which would help
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
## 2018-09-05 NOTE — Patient Instructions (Signed)
Xray of knee now  We will get back to you with a result   Use ice or heat if it helps (10 minutes)  Try the voltaren gel -it may help Tylenol may also help

## 2018-09-05 NOTE — Assessment & Plan Note (Signed)
Intermittent /occ symptomatic  Filled metoprolol tartarate 25 mg for bid prn use  She uses infrequently  Pulse Rate: 96

## 2018-09-06 ENCOUNTER — Telehealth: Payer: Self-pay | Admitting: Family Medicine

## 2018-09-06 DIAGNOSIS — M25561 Pain in right knee: Principal | ICD-10-CM

## 2018-09-06 DIAGNOSIS — G8929 Other chronic pain: Secondary | ICD-10-CM

## 2018-09-06 NOTE — Telephone Encounter (Signed)
-----   Message from Ashley Rasmussen, [place] sent at 09/06/2018  4:50 PM EST ----- Pt viewed xray results on mychart and she does agree with Ortho referral. Please put referral in and I advise pt our [facility] will call to schedule appt

## 2018-09-08 NOTE — Telephone Encounter (Signed)
Appt made and patient aware.  

## 2018-10-03 DIAGNOSIS — M1711 Unilateral primary osteoarthritis, right knee: Secondary | ICD-10-CM | POA: Diagnosis not present

## 2018-10-03 DIAGNOSIS — M25361 Other instability, right knee: Secondary | ICD-10-CM | POA: Diagnosis not present

## 2018-12-15 ENCOUNTER — Ambulatory Visit: Payer: PPO

## 2018-12-20 ENCOUNTER — Encounter: Payer: PPO | Admitting: Family Medicine

## 2018-12-30 ENCOUNTER — Other Ambulatory Visit: Payer: Self-pay | Admitting: Family Medicine

## 2019-01-17 ENCOUNTER — Ambulatory Visit: Payer: PPO

## 2019-01-23 ENCOUNTER — Ambulatory Visit: Payer: PPO | Admitting: Family Medicine

## 2019-01-24 ENCOUNTER — Ambulatory Visit: Payer: PPO | Admitting: Family Medicine

## 2019-01-24 ENCOUNTER — Other Ambulatory Visit: Payer: PPO

## 2019-01-24 ENCOUNTER — Other Ambulatory Visit: Payer: Self-pay

## 2019-02-03 ENCOUNTER — Encounter: Payer: PPO | Admitting: Family Medicine

## 2019-02-24 ENCOUNTER — Other Ambulatory Visit: Payer: Self-pay

## 2019-03-21 ENCOUNTER — Ambulatory Visit (INDEPENDENT_AMBULATORY_CARE_PROVIDER_SITE_OTHER): Payer: PPO

## 2019-03-21 DIAGNOSIS — Z23 Encounter for immunization: Secondary | ICD-10-CM

## 2019-05-23 ENCOUNTER — Other Ambulatory Visit: Payer: Self-pay | Admitting: *Deleted

## 2019-05-23 NOTE — Telephone Encounter (Signed)
Pt has canceled/rescheduled multiple CPE and AWV with our office, last appt was an acute appt on 09/05/18 for knee pain and no future appts., [facility]

## 2019-05-23 NOTE — Telephone Encounter (Signed)
Please re schedule PE and refill until then 

## 2019-05-24 NOTE — Telephone Encounter (Signed)
Patient scheduled next available cpx appointment on 06/27/19.

## 2019-05-25 MED ORDER — OMEPRAZOLE 20 MG PO CPDR: 20.0000 mg | DELAYED_RELEASE_CAPSULE | Freq: Every day | ORAL | 1 refills | Status: DC

## 2019-05-25 NOTE — Telephone Encounter (Signed)
Med refilled.

## 2019-06-06 ENCOUNTER — Other Ambulatory Visit: Payer: Self-pay | Admitting: Family Medicine

## 2019-06-07 ENCOUNTER — Other Ambulatory Visit: Payer: Self-pay | Admitting: Family Medicine

## 2019-06-07 DIAGNOSIS — Z1231 Encounter for screening mammogram for malignant neoplasm of breast: Secondary | ICD-10-CM

## 2019-06-18 ENCOUNTER — Telehealth: Payer: Self-pay | Admitting: Family Medicine

## 2019-06-18 DIAGNOSIS — R7309 Other abnormal glucose: Secondary | ICD-10-CM

## 2019-06-18 DIAGNOSIS — Z0000 Encounter for general adult medical examination without abnormal findings: Secondary | ICD-10-CM

## 2019-06-18 DIAGNOSIS — E7849 Other hyperlipidemia: Secondary | ICD-10-CM

## 2019-06-18 DIAGNOSIS — M810 Age-related osteoporosis without current pathological fracture: Secondary | ICD-10-CM

## 2019-06-18 NOTE — Telephone Encounter (Signed)
-----   Message from Ellamae Sia sent at 06/12/2019 10:20 AM EST ----- Regarding: lab orders for Monday, 11.23.20 Patient is scheduled for CPX labs, please order future labs, Thanks , Karna Christmas

## 2019-06-19 ENCOUNTER — Other Ambulatory Visit (INDEPENDENT_AMBULATORY_CARE_PROVIDER_SITE_OTHER): Payer: PPO

## 2019-06-19 DIAGNOSIS — E7849 Other hyperlipidemia: Secondary | ICD-10-CM | POA: Diagnosis not present

## 2019-06-19 DIAGNOSIS — R7309 Other abnormal glucose: Secondary | ICD-10-CM | POA: Diagnosis not present

## 2019-06-19 DIAGNOSIS — Z0000 Encounter for general adult medical examination without abnormal findings: Secondary | ICD-10-CM

## 2019-06-19 DIAGNOSIS — M810 Age-related osteoporosis without current pathological fracture: Secondary | ICD-10-CM | POA: Diagnosis not present

## 2019-06-19 LAB — LIPID PANEL
Cholesterol: 224 mg/dL — ABNORMAL HIGH (ref 0–200)
HDL: 42.2 mg/dL (ref >39.00)
LDL Cholesterol: 153 mg/dL — ABNORMAL HIGH (ref 0–99)
NonHDL: 182.13
Total CHOL/HDL Ratio: 5
Triglycerides: 144 mg/dL (ref 0.0–149.0)
VLDL: 28.8 mg/dL (ref 0.0–40.0)

## 2019-06-19 LAB — COMPREHENSIVE METABOLIC PANEL
ALT: 12 U/L (ref 0–35)
AST: 13 U/L (ref 0–37)
Albumin: 3.9 g/dL (ref 3.5–5.2)
Alkaline Phosphatase: 106 U/L (ref 39–117)
BUN: 19 mg/dL (ref 6–23)
CO2: 27 mEq/L (ref 19–32)
Calcium: 9.6 mg/dL (ref 8.4–10.5)
Chloride: 106 mEq/L (ref 96–112)
Creatinine, Ser: 0.88 mg/dL (ref 0.40–1.20)
GFR: 63.7 mL/min (ref >60.00)
Glucose, Bld: 110 mg/dL — ABNORMAL HIGH (ref 70–99)
Potassium: 4.4 mEq/L (ref 3.5–5.1)
Sodium: 141 mEq/L (ref 135–145)
Total Bilirubin: 0.5 mg/dL (ref 0.2–1.2)
Total Protein: 6.3 g/dL (ref 6.0–8.3)

## 2019-06-19 LAB — TSH: TSH: 1.9 u[IU]/mL (ref 0.35–4.50)

## 2019-06-19 LAB — CBC WITH DIFFERENTIAL/PLATELET
Basophils Absolute: 0 10*3/uL (ref 0.0–0.1)
Basophils Relative: 0.5 % (ref 0.0–3.0)
Eosinophils Absolute: 0.1 10*3/uL (ref 0.0–0.7)
Eosinophils Relative: 1.4 % (ref 0.0–5.0)
HCT: 42.4 % (ref 36.0–46.0)
Hemoglobin: 14 g/dL (ref 12.0–15.0)
Lymphocytes Relative: 30.6 % (ref 12.0–46.0)
Lymphs Abs: 2.1 10*3/uL (ref 0.7–4.0)
MCHC: 33 g/dL (ref 30.0–36.0)
MCV: 89.2 fl (ref 78.0–100.0)
Monocytes Absolute: 0.4 10*3/uL (ref 0.1–1.0)
Monocytes Relative: 6.1 % (ref 3.0–12.0)
Neutro Abs: 4.2 10*3/uL (ref 1.4–7.7)
Neutrophils Relative %: 61.4 % (ref 43.0–77.0)
Platelets: 255 10*3/uL (ref 150.0–400.0)
RBC: 4.75 Mil/uL (ref 3.87–5.11)
RDW: 13.3 % (ref 11.5–15.5)
WBC: 6.8 10*3/uL (ref 4.0–10.5)

## 2019-06-19 LAB — HEMOGLOBIN A1C: Hgb A1c MFr Bld: 5.8 % (ref 4.6–6.5)

## 2019-06-19 LAB — VITAMIN D 25 HYDROXY (VIT D DEFICIENCY, FRACTURES): VITD: 39.34 ng/mL (ref 30.00–100.00)

## 2019-06-21 ENCOUNTER — Other Ambulatory Visit: Payer: Self-pay

## 2019-06-27 ENCOUNTER — Encounter: Payer: Self-pay | Admitting: Family Medicine

## 2019-06-27 ENCOUNTER — Other Ambulatory Visit: Payer: Self-pay

## 2019-06-27 ENCOUNTER — Ambulatory Visit (INDEPENDENT_AMBULATORY_CARE_PROVIDER_SITE_OTHER): Payer: PPO | Admitting: Family Medicine

## 2019-06-27 VITALS — BP 128/77 | HR 105 | Temp 97.6°F | Ht 65.75 in | Wt 193.4 lb

## 2019-06-27 DIAGNOSIS — R7309 Other abnormal glucose: Secondary | ICD-10-CM

## 2019-06-27 DIAGNOSIS — H9192 Unspecified hearing loss, left ear: Secondary | ICD-10-CM

## 2019-06-27 DIAGNOSIS — Z0000 Encounter for general adult medical examination without abnormal findings: Secondary | ICD-10-CM

## 2019-06-27 DIAGNOSIS — E6609 Other obesity due to excess calories: Secondary | ICD-10-CM

## 2019-06-27 DIAGNOSIS — Z853 Personal history of malignant neoplasm of breast: Secondary | ICD-10-CM

## 2019-06-27 DIAGNOSIS — E2839 Other primary ovarian failure: Secondary | ICD-10-CM

## 2019-06-27 DIAGNOSIS — R000 Tachycardia, unspecified: Secondary | ICD-10-CM | POA: Diagnosis not present

## 2019-06-27 DIAGNOSIS — M810 Age-related osteoporosis without current pathological fracture: Secondary | ICD-10-CM | POA: Diagnosis not present

## 2019-06-27 DIAGNOSIS — K219 Gastro-esophageal reflux disease without esophagitis: Secondary | ICD-10-CM

## 2019-06-27 DIAGNOSIS — E7849 Other hyperlipidemia: Secondary | ICD-10-CM

## 2019-06-27 DIAGNOSIS — Z6831 Body mass index (BMI) 31.0-31.9, adult: Secondary | ICD-10-CM

## 2019-06-27 MED ORDER — DICLOFENAC SODIUM 1 % EX GEL: 2.0000 g | Freq: Four times a day (QID) | CUTANEOUS | 3 refills | Status: DC

## 2019-06-27 MED ORDER — OMEPRAZOLE 20 MG PO CPDR: DELAYED_RELEASE_CAPSULE | ORAL | 11 refills | Status: DC

## 2019-06-27 MED ORDER — FAMOTIDINE 40 MG PO TABS: 40.0000 mg | ORAL_TABLET | Freq: Every day | ORAL | 11 refills | Status: DC

## 2019-06-27 MED ORDER — ROSUVASTATIN CALCIUM 5 MG PO TABS: 5.0000 mg | ORAL_TABLET | Freq: Every day | ORAL | 11 refills | Status: DC

## 2019-06-27 NOTE — Assessment & Plan Note (Signed)
Disc goals for lipids and reasons to control them Rev last labs with pt Rev low sat fat diet in detail LDL is up  Will try crestor 5 mg and see if she tolerates it  Re check lab in 6-8 weeks

## 2019-06-27 NOTE — Assessment & Plan Note (Signed)
Reviewed health habits including diet and exercise and skin cancer prevention Reviewed appropriate screening tests for age  Also reviewed health mt list, fam hx and immunization status , as well as social and family history   See HPI Labs rev Mammogram planned  Discussed shingrix vaccine dexa ordered  Enc exercise  Given materials to work on [employer] directive No cognitive concerns Poor hearing on L - needs cerumen removal and eventually audiology eval when ready No vision concerns

## 2019-06-27 NOTE — Assessment & Plan Note (Signed)
Discussed how this problem influences overall health and the risks it imposes  Reviewed plan for weight loss with lower calorie diet (via better food choices and also portion control or program like weight watchers) and exercise building up to or more than 30 minutes 5 days per week including some aerobic activity    

## 2019-06-27 NOTE — Assessment & Plan Note (Signed)
Takes metoprolol prn

## 2019-06-27 NOTE — Progress Notes (Signed)
Subjective:    Patient ID: Ashley Rasmussen, female    DOB: 12/16/1950, 68 y.o.   MRN: [id number]  HPI Here for amw and health mt exam with review of chronic medical problems  I have personally reviewed the Medicare Annual Wellness questionnaire and have noted 1. The patient's medical and social history 2. Their use of alcohol, tobacco or illicit drugs 3. Their current medications and supplements 4. The patient's functional ability including ADL's, fall risks, home safety risks and hearing or visual             impairment. 5. Diet and physical activities 6. Evidence for depression or mood disorders  The patients weight, height, BMI have been recorded in the chart and visual acuity is per eye clinic.  I have made referrals, counseling and provided education to the patient based review of the above and I have provided the pt with a written personalized care plan for preventive services. Reviewed and updated provider list, see scanned forms.  See scanned forms.  Routine anticipatory guidance given to patient.  See health maintenance. Colon cancer screening colonoscopy 9/19 Breast cancer screening mammogram 12/19  (has appt scheduled in jan) Strong family h/o breast cancer and personal hx of breast cancer MRI had been recommended in the past- pt unsure / could not afford genetic testing Self breast exam -no lumps or changes  (surgical flap on R sometimes feels funny) Flu vaccine 8/20 Tetanus vaccine 5/18 Tdap Pneumovax completed  Zoster vaccine-interested in shingrix/has to check on coverage  Dexa 9/09 -osteopenia  tamoxifen in the past  Falls -none   (put rails on stairs to prevent falls)  Fractures- none  Supplements-takes ca and D  Exercise - walks as much as she can  D level is 39.3  Has to take ppi unfortunately for GERD   Advance directive-not done/given materials  Cognitive function addressed- see scanned forms- and if abnormal then additional documentation follows.   No  problems with memory - very sharp  When done watching grandkids hopes to go back to work/ misses it  Good concentration   PMH and SH reviewed  Meds, vitals, and allergies reviewed.   ROS: See HPI.  Otherwise negative.    Feeling pretty good overall  No complaints   Weight : Wt Readings from Last 3 Encounters:  06/27/19 193 lb 7 oz (87.7 kg)  08/18/18 191 lb 8 oz (86.9 kg)  07/21/18 193 lb 3.2 oz (87.6 kg)  needs to exercise more  Diet could be better - daughter moved in and had her kitchen renovated -now is getting back to cooking  31.46 kg/m   Hearing/vision:  Hearing Screening   125Hz  250Hz  500Hz  1000Hz  2000Hz  3000Hz  4000Hz  6000Hz  8000Hz   Right ear:   0 40 40  0    Left ear:   0 0 0  0    Vision Screening Comments: [place] [facility] exam yearly uses drops for dry eyes - vision is good  Pt has baseline hearing loss in L ear - has not been to a hearing specialist yet  No known injury/ did have ear infections as a child   BP Readings from Last 3 Encounters:  06/27/19 (!) 148/94  09/05/18 138/76  08/18/18 126/72  re check much improved BP: 128/77   Pulse Readings from Last 3 Encounters:  06/27/19 (!) 105  09/05/18 96  08/18/18 88  has metoprolol to take prn tachycardia  Did not take it today-is nervous    H/o elevated  glucose level Lab Results  Component Value Date   HGBA1C 5.8 06/19/2019   Hyperlipidemia Lab Results  Component Value Date   CHOL 224 (H) 06/19/2019   CHOL 209 (H) 12/09/2017   CHOL 221 (H) 11/24/2016   Lab Results  Component Value Date   HDL 42.20 06/19/2019   HDL 42.00 12/09/2017   HDL 45.90 11/24/2016   Lab Results  Component Value Date   LDLCALC 153 (H) 06/19/2019   LDLCALC 130 (H) 12/09/2017   LDLCALC 158 (H) 04/06/2008   Lab Results  Component Value Date   TRIG 144.0 06/19/2019   TRIG 185.0 (H) 12/09/2017   TRIG 203.0 (H) 11/24/2016   Lab Results  Component Value Date   CHOLHDL 5 06/19/2019   CHOLHDL 5 12/09/2017    CHOLHDL 5 11/24/2016   Lab Results  Component Value Date   LDLDIRECT 153.0 11/24/2016   LDLDIRECT 177.0 09/27/2015   LDLDIRECT 140.9 08/09/2012   She would be open to medication  One statin gave her pain Would like to try low dose crestor   Other labs Results for orders placed or performed in visit on 06/19/19  Hemoglobin A1c  Result Value Ref Range   Hgb A1c MFr Bld 5.8 4.6 - 6.5 %  Vitamin D (25 hydroxy)  Result Value Ref Range   VITD 39.34 30.00 - 100.00 ng/mL  TSH  Result Value Ref Range   TSH 1.90 0.35 - 4.50 uIU/mL  Lipid panel  Result Value Ref Range   Cholesterol 224 (H) 0 - 200 mg/dL   Triglycerides 144.0 0.0 - 149.0 mg/dL   HDL 42.20 >39.00 mg/dL   VLDL 28.8 0.0 - 40.0 mg/dL   LDL Cholesterol 153 (H) 0 - 99 mg/dL   Total CHOL/HDL Ratio 5    NonHDL 182.13   Comprehensive metabolic panel  Result Value Ref Range   Sodium 141 135 - 145 mEq/L   Potassium 4.4 3.5 - 5.1 mEq/L   Chloride 106 96 - 112 mEq/L   CO2 27 19 - 32 mEq/L   Glucose, Bld 110 (H) 70 - 99 mg/dL   BUN 19 6 - 23 mg/dL   Creatinine, Ser 0.88 0.40 - 1.20 mg/dL   Total Bilirubin 0.5 0.2 - 1.2 mg/dL   Alkaline Phosphatase 106 39 - 117 U/L   AST 13 0 - 37 U/L   ALT 12 0 - 35 U/L   Total Protein 6.3 6.0 - 8.3 g/dL   Albumin 3.9 3.5 - 5.2 g/dL   GFR 63.70 >60.00 mL/min   Calcium 9.6 8.4 - 10.5 mg/dL  CBC with Differential  Result Value Ref Range   WBC 6.8 4.0 - 10.5 K/uL   RBC 4.75 3.87 - 5.11 Mil/uL   Hemoglobin 14.0 12.0 - 15.0 g/dL   HCT 42.4 36.0 - 46.0 %   MCV 89.2 78.0 - 100.0 fl   MCHC 33.0 30.0 - 36.0 g/dL   RDW 13.3 11.5 - 15.5 %   Platelets 255.0 150.0 - 400.0 K/uL   Neutrophils Relative % 61.4 43.0 - 77.0 %   Lymphocytes Relative 30.6 12.0 - 46.0 %   Monocytes Relative 6.1 3.0 - 12.0 %   Eosinophils Relative 1.4 0.0 - 5.0 %   Basophils Relative 0.5 0.0 - 3.0 %   Neutro Abs 4.2 1.4 - 7.7 K/uL   Lymphs Abs 2.1 0.7 - 4.0 K/uL   Monocytes Absolute 0.4 0.1 - 1.0 K/uL   Eosinophils  Absolute 0.1 0.0 - 0.7 K/uL  Basophils Absolute 0.0 0.0 - 0.1 K/uL    Patient Active Problem List   Diagnosis Date Noted  . Medicare annual wellness visit, subsequent 06/27/2019  . Right knee pain 09/05/2018  . Estrogen deficiency 01/26/2018  . Elevated glucose level 12/08/2017  . Anal itching 09/17/2017  . Family history of melanoma 11/24/2016  . Tachycardia   . Hearing loss in left ear 10/02/2015  . GERD (gastroesophageal reflux disease) 05/08/2015  . Obesity 12/13/2012  . Encounter for routine gynecological examination 08/16/2012  . Colon cancer screening 08/16/2012  . Routine general medical examination at a health care facility 08/08/2012  . Uterine prolapse 07/06/2012  . BACK PAIN, LUMBAR, WITH RADICULOPATHY 08/15/2010  . Hyperlipidemia 04/13/2008  . Osteoporosis 04/06/2008  . Allergic rhinitis 04/15/2007  . BREAST CANCER, HX OF 04/15/2007   Past Medical History:  Diagnosis Date  . Allergic rhinitis, cause unspecified   . HLD (hyperlipidemia)   . Osteopenia   . Osteoporosis, unspecified   . Personal history of chemotherapy   . Personal history of malignant neoplasm of breast    needs yearly mammogram and MRI every 2  . Personal history of radiation therapy   . Thoracic or lumbosacral neuritis or radiculitis, unspecified    Past Surgical History:  Procedure Laterality Date  . BREAST BIOPSY Left 03/2015  . Breast cancer excision    . BREAST LUMPECTOMY Right   . COLONOSCOPY WITH PROPOFOL N/A 04/11/2018   Procedure: COLONOSCOPY WITH PROPOFOL;  Surgeon: Jonathon Bellows, MD;  Location: [facility]COPY;  Service: Gastroenterology;  Laterality: N/A;  . LATISSIMUS FLAP TO BREAST     due to radiation burn   Social History   Tobacco Use  . Smoking status: Former Research scientist (life sciences)  . Smokeless tobacco: Never Used  . Tobacco comment: quit over 7yrs ago  Substance Use Topics  . Alcohol use: No    Alcohol/week: 0.0 standard drinks  . Drug use: No   Family History  Problem Relation  Age of Onset  . Other Mother        B-12 deficiency  . Breast cancer Mother   . Melanoma Mother   . Myelodysplastic syndrome Mother   . Osteoporosis Mother   . Other Sister        B-12 deficiency  . Breast cancer Sister   . Osteoporosis Sister   . Breast cancer Other        Multiple cousins   Allergies  Allergen Reactions  . Alendronate Sodium     REACTION: GI side effects  . Nsaids     GI upset with oral nsaids  . Pravachol     myalgias  . Raloxifene     REACTION: breast tenderness and leg pain  . Simvastatin     REACTION: myalgias   Current Outpatient Medications on File Prior to Visit  Medication Sig Dispense Refill  . acetaminophen (TYLENOL) 500 MG tablet Take 500 mg by mouth every 6 (six) hours as needed.    . cetirizine (ZYRTEC) 10 MG tablet Take 10 mg by mouth at bedtime.     . diclofenac sodium (VOLTAREN) 1 % GEL Apply 2 g topically 4 (four) times daily as needed. 1 Tube 3  . fluticasone (FLONASE) 50 MCG/ACT nasal spray SPRAY 2 SPRAYS INTO EACH NOSTRIL EVERY DAY 48 g 0  . hydrocortisone (ANUSOL-HC) 2.5 % rectal cream Apply to external anal area for itch once daily as needed 30 g 0  . metoprolol tartrate (LOPRESSOR) 25 MG tablet Take 1 tablet (25  mg total) by mouth 2 (two) times daily as needed (for fast heart rate or palpitations). 30 tablet 3  . Multiple Vitamin (MULTIVITAMIN) tablet Take 3 tablets by mouth daily.     . Phenazopyridine HCl (AZO TABS PO) Take by mouth.     No current facility-administered medications on file prior to visit.     Review of Systems  Constitutional: Negative for activity change, appetite change, fatigue, fever and unexpected weight change.  HENT: Negative for congestion, ear pain, rhinorrhea, sinus pressure and sore throat.   Eyes: Negative for pain, redness and visual disturbance.  Respiratory: Negative for cough, shortness of breath and wheezing.   Cardiovascular: Negative for chest pain and palpitations.  Gastrointestinal:  Negative for abdominal pain, blood in stool, constipation and diarrhea.  Endocrine: Negative for polydipsia and polyuria.  Genitourinary: Negative for dysuria, frequency and urgency.  Musculoskeletal: Negative for arthralgias, back pain and myalgias.  Skin: Negative for pallor and rash.  Allergic/Immunologic: Negative for environmental allergies.  Neurological: Negative for dizziness, syncope and headaches.  Hematological: Negative for adenopathy. Does not bruise/bleed easily.  Psychiatric/Behavioral: Negative for decreased concentration and dysphoric mood. The patient is not nervous/anxious.        Objective:   Physical Exam Constitutional:      General: She is not in acute distress.    Appearance: Normal appearance. She is well-developed. She is obese. She is not ill-appearing or diaphoretic.  HENT:     Head: Normocephalic and atraumatic.     Right Ear: Tympanic membrane, ear canal and external ear normal.     Left Ear: Tympanic membrane, ear canal and external ear normal.     Nose: Nose normal. No congestion.     Mouth/Throat:     Mouth: Mucous membranes are moist.     Pharynx: Oropharynx is clear. No posterior oropharyngeal erythema.  Eyes:     General: No scleral icterus.    Extraocular Movements: Extraocular movements intact.     Conjunctiva/sclera: Conjunctivae normal.     Pupils: Pupils are equal, round, and reactive to light.  Neck:     Musculoskeletal: Normal range of motion and neck supple. No neck rigidity or muscular tenderness.     Thyroid: No thyromegaly.     Vascular: No carotid bruit or JVD.  Cardiovascular:     Rate and Rhythm: Normal rate and regular rhythm.     Pulses: Normal pulses.     Heart sounds: Normal heart sounds. No gallop.   Pulmonary:     Effort: Pulmonary effort is normal. No respiratory distress.     Breath sounds: Normal breath sounds. No wheezing.     Comments: Good air exch Chest:     Chest wall: No tenderness.  Abdominal:     General:  Bowel sounds are normal. There is no distension or abdominal bruit.     Palpations: Abdomen is soft. There is no mass.     Tenderness: There is no abdominal tenderness.     Hernia: No hernia is present.  Genitourinary:    Comments: Breast exam left:  No mass, nodules, thickening, tenderness, bulging, retraction, inflamation, nipple discharge or skin changes noted.  No axillary or clavicular LA.      Breast exam R- surgical changes with flap noted No M  No axillary M Not tender  Musculoskeletal: Normal range of motion.        General: No tenderness.     Right lower leg: No edema.     Left lower leg:  No edema.     Comments: Mild kyphosis   Lymphadenopathy:     Cervical: No cervical adenopathy.  Skin:    General: Skin is warm and dry.     Coloration: Skin is not pale.     Findings: No erythema or rash.     Comments: sks diffusely  Neurological:     Mental Status: She is alert. Mental status is at baseline.     Cranial Nerves: No cranial nerve deficit.     Motor: No abnormal muscle tone.     Coordination: Coordination normal.     Gait: Gait normal.     Deep Tendon Reflexes: Reflexes are normal and symmetric. Reflexes normal.  Psychiatric:        Mood and Affect: Mood normal.        Cognition and Memory: Cognition and memory normal.           Assessment & Plan:   Problem List Items Addressed This Visit      Digestive   GERD (gastroesophageal reflux disease)   Relevant Medications   famotidine (PEPCID) 40 MG tablet   omeprazole (PRILOSEC) 20 MG capsule     Nervous and Auditory   Hearing loss in left ear    Needs cerumen removal-she will start using debrox otc        Musculoskeletal and Integument   Osteoporosis    Due for dexa tamoxofen in the past  D level is in tx range  Disc plan for exercise  No falls or fx        Other   Hyperlipidemia    Disc goals for lipids and reasons to control them Rev last labs with pt Rev low sat fat diet in detail LDL is  up  Will try crestor 5 mg and see if she tolerates it  Re check lab in 6-8 weeks      Relevant Medications   rosuvastatin (CRESTOR) 5 MG tablet   Other Relevant Orders   Lipid panel   ALT   AST   BREAST CANCER, HX OF    Doing well  L breast-past surg/rad/chemo Prev on tamoxifen  Continues mammograms -planned in January Enc self exams      Routine general medical examination at a health care facility    Reviewed health habits including diet and exercise and skin cancer prevention Reviewed appropriate screening tests for age  Also reviewed health mt list, fam hx and immunization status , as well as social and family history   See HPI Labs rev Mammogram planned  Discussed shingrix vaccine dexa ordered  Enc exercise  Given materials to work on [employer] directive No cognitive concerns Poor hearing on L - needs cerumen removal and eventually audiology eval when ready No vision concerns      Obesity    Discussed how this problem influences overall health and the risks it imposes  Reviewed plan for weight loss with lower calorie diet (via better food choices and also portion control or program like weight watchers) and exercise building up to or more than 30 minutes 5 days per week including some aerobic activity         Tachycardia    Takes metoprolol prn      Elevated glucose level    Lab Results  Component Value Date   HGBA1C 5.8 06/19/2019   disc imp of low glycemic diet and wt loss to prevent DM2       Estrogen deficiency   Relevant Orders  DG Bone Density   Medicare annual wellness visit, subsequent - Primary    Reviewed health habits including diet and exercise and skin cancer prevention Reviewed appropriate screening tests for age  Also reviewed health mt list, fam hx and immunization status , as well as social and family history   See HPI Labs rev Mammogram planned  Discussed shingrix vaccine dexa ordered  Enc exercise  Given materials to work on [employer]  directive No cognitive concerns Poor hearing on L - needs cerumen removal and eventually audiology eval when ready No vision concerns

## 2019-06-27 NOTE — Assessment & Plan Note (Signed)
Needs cerumen removal-she will start using debrox otc

## 2019-06-27 NOTE — Assessment & Plan Note (Signed)
Lab Results  Component Value Date   HGBA1C 5.8 06/19/2019   disc imp of low glycemic diet and wt loss to prevent DM2

## 2019-06-27 NOTE — Patient Instructions (Addendum)
If you are interested in the new shingles vaccine (Shingrix) - call your local pharmacy to check on coverage and availability  If affordable, get on a wait list at your pharmacy to get the vaccine.  When you check out we will schedule a bone density test    A recumbent exercise bike would be great for exercise  Try to exercise at least 30 minutes per day   Please work on an advance directive (blue packet)   You have hearing loss on the left side Let us know when you are ready to see a hearing specialist (audiologist)   Also use an ear wax product like debrox to help get wax out of that ear  (which can affect hearing)   Start crestor 5 mg daily for cholesterol  We will re check cholesterol in 6-8 weeks

## 2019-06-27 NOTE — Assessment & Plan Note (Signed)
Doing well  L breast-past surg/rad/chemo Prev on tamoxifen  Continues mammograms -planned in January Enc self exams

## 2019-06-27 NOTE — Assessment & Plan Note (Signed)
Due for dexa tamoxofen in the past  D level is in tx range  Disc plan for exercise  No falls or fx

## 2019-07-10 ENCOUNTER — Other Ambulatory Visit: Payer: Self-pay

## 2019-07-10 DIAGNOSIS — Z20822 Contact with and (suspected) exposure to covid-19: Secondary | ICD-10-CM

## 2019-07-11 LAB — NOVEL CORONAVIRUS, NAA: SARS-CoV-2, NAA: DETECTED — AB

## 2019-07-12 ENCOUNTER — Ambulatory Visit (INDEPENDENT_AMBULATORY_CARE_PROVIDER_SITE_OTHER): Payer: PPO | Admitting: Family Medicine

## 2019-07-12 ENCOUNTER — Encounter: Payer: Self-pay | Admitting: Family Medicine

## 2019-07-12 ENCOUNTER — Telehealth: Payer: Self-pay | Admitting: Unknown Physician Specialty

## 2019-07-12 ENCOUNTER — Encounter (INDEPENDENT_AMBULATORY_CARE_PROVIDER_SITE_OTHER): Payer: Self-pay

## 2019-07-12 ENCOUNTER — Other Ambulatory Visit: Payer: Self-pay

## 2019-07-12 DIAGNOSIS — U071 COVID-19: Secondary | ICD-10-CM | POA: Insufficient documentation

## 2019-07-12 NOTE — Telephone Encounter (Signed)
Called to discuss with patient about Covid symptoms and the use of bamlanivimab, a monoclonal antibody infusion for those with mild to moderate Covid symptoms and at a high risk of hospitalization.  Pt is qualified for this infusion at the [facility] due to Age > 65   Message left to call back  

## 2019-07-12 NOTE — Telephone Encounter (Signed)
Pt returning your call.  Pt states she is doing better. cb [phone]

## 2019-07-12 NOTE — Assessment & Plan Note (Signed)
Positive test for covid 19 from 2 days ago after experiencing mild flu like symptoms for a week She is resting/isolating and taking tylenol as needed Enc goof fluid intake and isolation for 2 wk or until symptom resolution  Watch for fever/cough/sob-she will call  It appears Ashley Rasmussen at [facility] practice called her re: monoclonal ab infusion- she will call her back  Pt is over 14

## 2019-07-12 NOTE — Progress Notes (Signed)
Virtual Visit via Video Note  I connected with [name] on 07/12/19 at  2:00 PM EST by a video enabled telemedicine application and verified that I am speaking with the correct person using two identifiers.  Location: Patient: home Provider: office    I discussed the limitations of evaluation and management by telemedicine and the availability of in person appointments. The patient expressed understanding and agreed to proceed.  Parties involved in encounter  Patient: Ashley Rasmussen  Provider:  Loura Pardon MD    History of Present Illness: Pt has recent diagnosis of Covid -69  She is 68 yo with hx of smoking (quit 35 years ago) as well as past breast cancer   She was called by [name] NP -noted she would qualify for the infusion at the [facility] site  For bamlanivimab (monoclonal ab infusion)   She had her test at [place] for about a week  Flu like symptoms -but mild  Tested 2 d ago and just got a pos test result back   Lost taste and smell  Some mild nasal congestion and pnd in her throat  Energy level is ok  A little headache pm/early am - mild   Just a little cough at night   No fever now  Earlier in the week had the sweats -no longer    Husband tested negative  Other family members were secondarily exposed  No confirmed cases  Unsure where she got it ([employer]?)  She always wears a mask   Drinking lots of water and resting  Taking tylenol for aches or headaches- as needed Not very dizzy (mild/occasional)   Patient Active Problem List   Diagnosis Date Noted  . Medicare annual wellness visit, subsequent 06/27/2019  . Right knee pain 09/05/2018  . Estrogen deficiency 01/26/2018  . Elevated glucose level 12/08/2017  . Anal itching 09/17/2017  . Family history of melanoma 11/24/2016  . Tachycardia   . Hearing loss in left ear 10/02/2015  . GERD (gastroesophageal reflux disease) 05/08/2015  . Obesity 12/13/2012  . Encounter for  routine gynecological examination 08/16/2012  . Colon cancer screening 08/16/2012  . Routine general medical examination at a health care facility 08/08/2012  . Uterine prolapse 07/06/2012  . BACK PAIN, LUMBAR, WITH RADICULOPATHY 08/15/2010  . Hyperlipidemia 04/13/2008  . Osteoporosis 04/06/2008  . Allergic rhinitis 04/15/2007  . BREAST CANCER, HX OF 04/15/2007   Past Medical History:  Diagnosis Date  . Allergic rhinitis, cause unspecified   . HLD (hyperlipidemia)   . Osteopenia   . Osteoporosis, unspecified   . Personal history of chemotherapy   . Personal history of malignant neoplasm of breast    needs yearly mammogram and MRI every 2  . Personal history of radiation therapy   . Thoracic or lumbosacral neuritis or radiculitis, unspecified    Past Surgical History:  Procedure Laterality Date  . BREAST BIOPSY Left 03/2015  . Breast cancer excision    . BREAST LUMPECTOMY Right   . COLONOSCOPY WITH PROPOFOL N/A 04/11/2018   Procedure: COLONOSCOPY WITH PROPOFOL;  Surgeon: Jonathon Bellows, MD;  Location: [facility] ENDOSCOPY;  Service: Gastroenterology;  Laterality: N/A;  . LATISSIMUS FLAP TO BREAST     due to radiation burn   Social History   Tobacco Use  . Smoking status: Former Research scientist (life sciences)  . Smokeless tobacco: Never Used  . Tobacco comment: quit over 68yrs ago  Substance Use Topics  . Alcohol use: No    Alcohol/week:  0.0 standard drinks  . Drug use: No   Family History  Problem Relation Age of Onset  . Other Mother        B-12 deficiency  . Breast cancer Mother   . Melanoma Mother   . Myelodysplastic syndrome Mother   . Osteoporosis Mother   . Other Sister        B-12 deficiency  . Breast cancer Sister   . Osteoporosis Sister   . Breast cancer Other        Multiple cousins   Allergies  Allergen Reactions  . Alendronate Sodium     REACTION: GI side effects  . Nsaids     GI upset with oral nsaids  . Pravachol     myalgias  . Raloxifene     REACTION: breast tenderness  and leg pain  . Simvastatin     REACTION: myalgias   Current Outpatient Medications on File Prior to Visit  Medication Sig Dispense Refill  . acetaminophen (TYLENOL) 500 MG tablet Take 500 mg by mouth every 6 (six) hours as needed.    . cetirizine (ZYRTEC) 10 MG tablet Take 10 mg by mouth at bedtime.     . diclofenac sodium (VOLTAREN) 1 % GEL Apply 2 g topically 4 (four) times daily as needed. 1 Tube 3  . diclofenac Sodium (VOLTAREN) 1 % GEL Apply 2 g topically 4 (four) times daily. 150 g 3  . famotidine (PEPCID) 40 MG tablet Take 1 tablet (40 mg total) by mouth daily. 30 tablet 11  . fluticasone (FLONASE) 50 MCG/ACT nasal spray SPRAY 2 SPRAYS INTO EACH NOSTRIL EVERY DAY 48 g 0  . hydrocortisone (ANUSOL-HC) 2.5 % rectal cream Apply to external anal area for itch once daily as needed 30 g 0  . metoprolol tartrate (LOPRESSOR) 25 MG tablet Take 1 tablet (25 mg total) by mouth 2 (two) times daily as needed (for fast heart rate or palpitations). 30 tablet 3  . Multiple Vitamin (MULTIVITAMIN) tablet Take 3 tablets by mouth daily.     Marland Kitchen omeprazole (PRILOSEC) 20 MG capsule TAKE 1 CAPSULE BY MOUTH EVERY DAY 30 capsule 11  . Phenazopyridine HCl (AZO TABS PO) Take by mouth.    . rosuvastatin (CRESTOR) 5 MG tablet Take 1 tablet (5 mg total) by mouth daily. 30 tablet 11   No current facility-administered medications on file prior to visit.    Observations/Objective: Patient appears well, in no distress Weight is baseline  No facial swelling or asymmetry Voice is very slightly hoarse  No obvious tremor or mobility impairment Moving neck and UEs normally Able to hear the call well  No cough or shortness of breath during interview  Talkative and mentally sharp with no cognitive changes No skin changes on face or neck , no rash or pallor Affect is normal    Assessment and Plan: Problem List Items Addressed This Visit      Other   COVID-19    Positive test for covid 19 from 2 days ago after  experiencing mild flu like symptoms for a week She is resting/isolating and taking tylenol as needed Enc goof fluid intake and isolation for 2 wk or until symptom resolution  Watch for fever/cough/sob-she will call  It appears [name] at [facility] practice called her re: monoclonal ab infusion- she will call her back  Pt is over 65            Follow Up Instructions:  Rest/drink fluids and isolate Please let  us know if your symptoms worsen -especially if you develop cough/shortness of breath/ fever   Call NP [name] back- she had left you a message about monoclonal antibody infusion   We will continue to check in    I discussed the assessment and treatment plan with the patient. The patient was provided an opportunity to ask questions and all were answered. The patient agreed with the plan and demonstrated an understanding of the instructions.   The patient was advised to call back or seek an in-person evaluation if the symptoms worsen or if the condition fails to improve as anticipated.     Loura Pardon, MD

## 2019-07-12 NOTE — Patient Instructions (Signed)
Rest/drink fluids and isolate Please let us know if your symptoms worsen -especially if you develop cough/shortness of breath/ fever   Call NP [name] back- she had left you a message about monoclonal antibody infusion   We will continue to check in

## 2019-07-13 ENCOUNTER — Encounter (INDEPENDENT_AMBULATORY_CARE_PROVIDER_SITE_OTHER): Payer: Self-pay

## 2019-07-13 ENCOUNTER — Telehealth: Payer: Self-pay | Admitting: Unknown Physician Specialty

## 2019-07-13 ENCOUNTER — Other Ambulatory Visit: Payer: Self-pay | Admitting: Unknown Physician Specialty

## 2019-07-13 DIAGNOSIS — U071 COVID-19: Secondary | ICD-10-CM

## 2019-07-13 NOTE — Progress Notes (Signed)
   There were no vitals taken for this visit.   Subjective:    Patient ID: [name], female    DOB: 1950-09-30, 68 y.o.   MRN: [id number]  HPI: JESSECA BULLARD is a 68 y.o. female  No chief complaint on file.   Relevant past medical, surgical, family and social history reviewed and updated as indicated. Interim medical history since our last visit reviewed. Allergies and medications reviewed and updated.  Review of Systems  Per HPI unless specifically indicated above     Objective:    There were no vitals taken for this visit.  Wt Readings from Last 3 Encounters:  06/27/19 193 lb 7 oz (87.7 kg)  08/18/18 191 lb 8 oz (86.9 kg)  07/21/18 193 lb 3.2 oz (87.6 kg)    Physical Exam  Results for orders placed or performed in visit on 07/10/19  Novel Coronavirus, NAA ([facility])   Specimen: Nasopharyngeal(NP) swabs in vial transport medium   NASOPHARYNGE  TESTING  Result Value Ref Range   SARS-CoV-2, NAA Detected (A) Not Detected      Assessment & Plan:   Problem List Items Addressed This Visit    None       Follow up plan: No follow-ups on file.

## 2019-07-13 NOTE — Telephone Encounter (Signed)
  I connected by phone with Ashley Rasmussen on 07/13/2019 at 9:45 AM to discuss the potential use of an new treatment for mild to moderate COVID-19 viral infection in non-hospitalized patients.  This patient is a 68 y.o. female that meets the FDA criteria for Emergency Use Authorization of bamlanivimab or casirivimab\imdevimab.  Has a (+) direct SARS-CoV-2 viral test result  Has mild or moderate COVID-19   Is ? 69 years of age and weighs ? 40 kg  Is NOT hospitalized due to COVID-19  Is NOT requiring oxygen therapy or requiring an increase in baseline oxygen flow rate due to COVID-19  Is within 10 days of symptom onset  Has at least one of the high risk factor(s) for progression to severe COVID-19 and/or hospitalization as defined in EUA.  Specific high risk criteria : >/= 69 yo   I have spoken and communicated the following to the patient or parent/caregiver:  1. FDA has authorized the emergency use of bamlanivimab and casirivimab\imdevimab for the treatment of mild to moderate COVID-19 in adults and pediatric patients with positive results of direct SARS-CoV-2 viral testing who are 13 years of age and older weighing at least 40 kg, and who are at high risk for progressing to severe COVID-19 and/or hospitalization.  2. The significant known and potential risks and benefits of bamlanivimab and casirivimab\imdevimab, and the extent to which such potential risks and benefits are unknown.  3. Information on available alternative treatments and the risks and benefits of those alternatives, including clinical trials.  4. Patients treated with bamlanivimab and casirivimab\imdevimab should continue to self-isolate and use infection control measures (e.g., wear mask, isolate, social distance, avoid sharing personal items, clean and disinfect "high touch" surfaces, and frequent handwashing) according to CDC guidelines.   5. The patient or parent/caregiver has the option to accept or refuse  bamlanivimab or casirivimab\imdevimab .  After reviewing this information with the patient, The patient agreed to proceed with receiving the bamlanimivab infusion and will be provided a copy of the Fact sheet prior to receiving the infusion.[name] 07/13/2019 9:45 AM

## 2019-07-14 ENCOUNTER — Ambulatory Visit (HOSPITAL_COMMUNITY)
Admission: RE | Admit: 2019-07-14 | Discharge: 2019-07-14 | Disposition: A | Discharge status: Home / Self Care | Payer: Medicare Other | Source: Ambulatory Visit | Attending: Pulmonary Disease | Admitting: Pulmonary Disease

## 2019-07-14 ENCOUNTER — Encounter (INDEPENDENT_AMBULATORY_CARE_PROVIDER_SITE_OTHER): Payer: Self-pay

## 2019-07-14 DIAGNOSIS — U071 COVID-19: Secondary | ICD-10-CM | POA: Diagnosis not present

## 2019-07-14 DIAGNOSIS — Z23 Encounter for immunization: Secondary | ICD-10-CM | POA: Diagnosis not present

## 2019-07-14 MED ORDER — DIPHENHYDRAMINE HCL 50 MG/ML IJ SOLN: 50.0000 mg | Freq: Once | INTRAMUSCULAR | Status: DC | PRN

## 2019-07-14 MED ORDER — METHYLPREDNISOLONE SODIUM SUCC 125 MG IJ SOLR: 125.0000 mg | Freq: Once | INTRAMUSCULAR | Status: DC | PRN

## 2019-07-14 MED ORDER — EPINEPHRINE 0.3 MG/0.3ML IJ SOAJ: 0.3000 mg | Freq: Once | INTRAMUSCULAR | Status: DC | PRN

## 2019-07-14 MED ORDER — ALBUTEROL SULFATE HFA 108 (90 BASE) MCG/ACT IN AERS: 2.0000 | INHALATION_SPRAY | Freq: Once | RESPIRATORY_TRACT | Status: DC | PRN

## 2019-07-14 MED ORDER — FAMOTIDINE IN NACL 20-0.9 MG/50ML-% IV SOLN: 20.0000 mg | Freq: Once | INTRAVENOUS | Status: DC | PRN

## 2019-07-14 MED ORDER — SODIUM CHLORIDE 0.9 % IV SOLN
700.0000 mg | Freq: Once | INTRAVENOUS | Status: AC
  Administered 2019-07-14: 700 mg via INTRAVENOUS
  Filled 2019-07-14: qty 20

## 2019-07-14 MED ORDER — SODIUM CHLORIDE 0.9 % IV SOLN
INTRAVENOUS | Status: DC | PRN
  Administered 2019-07-14: 250 mL via INTRAVENOUS

## 2019-07-14 NOTE — Progress Notes (Signed)
  Diagnosis: COVID-19  Physician: Dr. Glori Bickers  Procedure: Covid Infusion Clinic Med: bamlanivimab infusion - Provided patient with bamlanimivab fact sheet for patients, parents and caregivers prior to infusion.  Complications: No immediate complications noted.  Discharge: Discharged home   Ashley Rasmussen 07/14/2019

## 2019-07-15 ENCOUNTER — Encounter (INDEPENDENT_AMBULATORY_CARE_PROVIDER_SITE_OTHER): Payer: Self-pay

## 2019-07-16 ENCOUNTER — Encounter (INDEPENDENT_AMBULATORY_CARE_PROVIDER_SITE_OTHER): Payer: Self-pay

## 2019-07-17 ENCOUNTER — Encounter (INDEPENDENT_AMBULATORY_CARE_PROVIDER_SITE_OTHER): Payer: Self-pay

## 2019-07-18 ENCOUNTER — Encounter (INDEPENDENT_AMBULATORY_CARE_PROVIDER_SITE_OTHER): Payer: Self-pay

## 2019-07-19 ENCOUNTER — Encounter (INDEPENDENT_AMBULATORY_CARE_PROVIDER_SITE_OTHER): Payer: Self-pay

## 2019-07-20 ENCOUNTER — Encounter (INDEPENDENT_AMBULATORY_CARE_PROVIDER_SITE_OTHER): Payer: Self-pay

## 2019-07-21 ENCOUNTER — Encounter (INDEPENDENT_AMBULATORY_CARE_PROVIDER_SITE_OTHER): Payer: Self-pay

## 2019-07-22 ENCOUNTER — Encounter (INDEPENDENT_AMBULATORY_CARE_PROVIDER_SITE_OTHER): Payer: Self-pay

## 2019-07-23 ENCOUNTER — Encounter (INDEPENDENT_AMBULATORY_CARE_PROVIDER_SITE_OTHER): Payer: Self-pay

## 2019-07-24 ENCOUNTER — Encounter (INDEPENDENT_AMBULATORY_CARE_PROVIDER_SITE_OTHER): Payer: Self-pay

## 2019-07-25 ENCOUNTER — Encounter (INDEPENDENT_AMBULATORY_CARE_PROVIDER_SITE_OTHER): Payer: Self-pay

## 2019-08-05 ENCOUNTER — Other Ambulatory Visit: Payer: Self-pay | Admitting: Family Medicine

## 2019-08-09 ENCOUNTER — Ambulatory Visit: Payer: PPO

## 2019-08-21 ENCOUNTER — Other Ambulatory Visit: Payer: PPO

## 2019-09-15 ENCOUNTER — Ambulatory Visit
Admission: RE | Admit: 2019-09-15 | Discharge: 2019-09-15 | Disposition: A | Discharge status: Home / Self Care | Payer: PPO | Source: Ambulatory Visit | Attending: Family Medicine | Admitting: Family Medicine

## 2019-09-15 ENCOUNTER — Other Ambulatory Visit: Payer: Self-pay

## 2019-09-15 DIAGNOSIS — Z1231 Encounter for screening mammogram for malignant neoplasm of breast: Secondary | ICD-10-CM

## 2019-09-25 ENCOUNTER — Other Ambulatory Visit: Payer: PPO

## 2019-10-11 ENCOUNTER — Ambulatory Visit (INDEPENDENT_AMBULATORY_CARE_PROVIDER_SITE_OTHER): Payer: PPO | Admitting: Family Medicine

## 2019-10-11 ENCOUNTER — Encounter: Payer: Self-pay | Admitting: Family Medicine

## 2019-10-11 DIAGNOSIS — J0100 Acute maxillary sinusitis, unspecified: Secondary | ICD-10-CM | POA: Diagnosis not present

## 2019-10-11 MED ORDER — AMOXICILLIN-POT CLAVULANATE 875-125 MG PO TABS: 1.0000 | ORAL_TABLET | Freq: Two times a day (BID) | ORAL | 0 refills | Status: DC

## 2019-10-11 NOTE — Assessment & Plan Note (Signed)
2/1/2 weeks of increasing facial pain and purulent nasal d/c  Prone to these occasionally  No imp with flonase or zyrtec  Suggested nasal saline and steam for congestion  Px augmentin (she tolerates well)  Watch for fever/cough or other new symptoms Update if not starting to improve in a week or if worsening

## 2019-10-11 NOTE — Progress Notes (Signed)
Virtual Visit via Video Note  I connected with Ashley Rasmussen on 10/11/19 at 12:00 PM EDT by a video enabled telemedicine application and verified that I am speaking with the correct person using two identifiers.  Location: Patient: home Provider: office   I discussed the limitations of evaluation and management by telemedicine and the availability of in person appointments. The patient expressed understanding and agreed to proceed.  Parties involved in encounter  Patient: Ashley Rasmussen   Provider:  Loura Pardon MD    History of Present Illness: Pt presents with sinus symptoms   Started 2 1/2 weeks ago  Facial pain is worsening around eyes (above and below) with pressure  Ears are full and blocked /occ throb  Both sides equal  Mucous is green to clear -worse in am  Moderate congestion in nose and throat (drainage-no pain) -scratchy in am  No coughing  No wheeze or sob  No change in taste or smell   No fever   She tried her flonase- did not help at all  Also zyrtec  Taking tylenol  Has not tried nasal saline    She did get through covid 19 back in December  Much better from that and had the ab infusion with it   Patient Active Problem List   Diagnosis Date Noted  . COVID-19 07/12/2019  . Medicare annual wellness visit, subsequent 06/27/2019  . Right knee pain 09/05/2018  . Acute sinusitis 08/18/2018  . Estrogen deficiency 01/26/2018  . Elevated glucose level 12/08/2017  . Anal itching 09/17/2017  . Family history of melanoma 11/24/2016  . Tachycardia   . Hearing loss in left ear 10/02/2015  . GERD (gastroesophageal reflux disease) 05/08/2015  . Obesity 12/13/2012  . Encounter for routine gynecological examination 08/16/2012  . Colon cancer screening 08/16/2012  . Routine general medical examination at a health care facility 08/08/2012  . Uterine prolapse 07/06/2012  . BACK PAIN, LUMBAR, WITH RADICULOPATHY 08/15/2010  . Hyperlipidemia 04/13/2008  . Osteoporosis  04/06/2008  . Allergic rhinitis 04/15/2007  . BREAST CANCER, HX OF 04/15/2007   Past Medical History:  Diagnosis Date  . Allergic rhinitis, cause unspecified   . HLD (hyperlipidemia)   . Osteopenia   . Osteoporosis, unspecified   . Personal history of chemotherapy   . Personal history of malignant neoplasm of breast    needs yearly mammogram and MRI every 2  . Personal history of radiation therapy   . Thoracic or lumbosacral neuritis or radiculitis, unspecified    Past Surgical History:  Procedure Laterality Date  . BREAST BIOPSY Left 03/2015  . Breast cancer excision    . BREAST LUMPECTOMY Right   . COLONOSCOPY WITH PROPOFOL N/A 04/11/2018   Procedure: COLONOSCOPY WITH PROPOFOL;  Surgeon: Jonathon Bellows, MD;  Location: [facility] ENDOSCOPY;  Service: Gastroenterology;  Laterality: N/A;  . LATISSIMUS FLAP TO BREAST     due to radiation burn   Social History   Tobacco Use  . Smoking status: Former Research scientist (life sciences)  . Smokeless tobacco: Never Used  . Tobacco comment: quit over 28yrs ago  Substance Use Topics  . Alcohol use: No    Alcohol/week: 0.0 standard drinks  . Drug use: No   Family History  Problem Relation Age of Onset  . Other Mother        B-12 deficiency  . Breast cancer Mother   . Melanoma Mother   . Myelodysplastic syndrome Mother   . Osteoporosis Mother   . Other Sister  B-12 deficiency  . Breast cancer Sister   . Osteoporosis Sister   . Breast cancer Other        Multiple cousins   Allergies  Allergen Reactions  . Alendronate Sodium     REACTION: GI side effects  . Nsaids     GI upset with oral nsaids  . Pravachol     myalgias  . Raloxifene     REACTION: breast tenderness and leg pain  . Simvastatin     REACTION: myalgias   Current Outpatient Medications on File Prior to Visit  Medication Sig Dispense Refill  . acetaminophen (TYLENOL) 500 MG tablet Take 500 mg by mouth every 6 (six) hours as needed.    . cetirizine (ZYRTEC) 10 MG tablet Take 10 mg by  mouth daily as needed.     . diclofenac sodium (VOLTAREN) 1 % GEL Apply 2 g topically 4 (four) times daily as needed. 1 Tube 3  . famotidine (PEPCID) 40 MG tablet Take 1 tablet (40 mg total) by mouth daily. 30 tablet 11  . fluticasone (FLONASE) 50 MCG/ACT nasal spray SPRAY 2 SPRAYS INTO EACH NOSTRIL EVERY DAY 48 mL 3  . hydrocortisone (ANUSOL-HC) 2.5 % rectal cream Apply to external anal area for itch once daily as needed 30 g 0  . metoprolol tartrate (LOPRESSOR) 25 MG tablet Take 1 tablet (25 mg total) by mouth 2 (two) times daily as needed (for fast heart rate or palpitations). 30 tablet 3  . Multiple Vitamin (MULTIVITAMIN) tablet Take 3 tablets by mouth daily.     Marland Kitchen omeprazole (PRILOSEC) 20 MG capsule TAKE 1 CAPSULE BY MOUTH EVERY DAY 30 capsule 11  . Phenazopyridine HCl (AZO TABS PO) Take by mouth daily as needed.     . rosuvastatin (CRESTOR) 5 MG tablet Take 1 tablet (5 mg total) by mouth daily. (Patient not taking: Reported on 10/11/2019) 30 tablet 11   No current facility-administered medications on file prior to visit.   Review of Systems  Constitutional: Negative for chills, fever and malaise/fatigue.  HENT: Positive for congestion and sinus pain. Negative for ear pain and sore throat.   Eyes: Negative for blurred vision, discharge and redness.  Respiratory: Negative for cough, shortness of breath, wheezing and stridor.   Cardiovascular: Negative for chest pain, palpitations and leg swelling.  Gastrointestinal: Negative for abdominal pain, diarrhea, nausea and vomiting.  Musculoskeletal: Negative for myalgias.  Skin: Negative for rash.  Neurological: Positive for headaches. Negative for dizziness.      Observations/Objective: Patient appears well, in no distress Weight is baseline  No facial swelling or asymmetry Normal voice-not hoarse and no slurred speech  (sounds nasally congested) No obvious tremor or mobility impairment Moving neck and UEs normally Able to hear the call  well  No cough or shortness of breath during interview  Talkative and mentally sharp with no cognitive changes No skin changes on face or neck , no rash or pallor Affect is normal    Assessment and Plan: Problem List Items Addressed This Visit      Respiratory   Acute sinusitis - Primary    2/1/2 weeks of increasing facial pain and purulent nasal d/c  Prone to these occasionally  No imp with flonase or zyrtec  Suggested nasal saline and steam for congestion  Px augmentin (she tolerates well)  Watch for fever/cough or other new symptoms Update if not starting to improve in a week or if worsening        Relevant Medications  amoxicillin-clavulanate (AUGMENTIN) 875-125 MG tablet       Follow Up Instructions: Try some nasal saline spray and steam for nasal congestion  Drink fluids  Tylenol is ok for pain  Take the augmentin as directed for sinus infection  Update if any new symptoms like fever or cough Update if not starting to improve in a week or if worsening     I discussed the assessment and treatment plan with the patient. The patient was provided an opportunity to ask questions and all were answered. The patient agreed with the plan and demonstrated an understanding of the instructions.   The patient was advised to call back or seek an in-person evaluation if the symptoms worsen or if the condition fails to improve as anticipated.     Loura Pardon, MD

## 2019-10-11 NOTE — Patient Instructions (Signed)
Try some nasal saline spray and steam for nasal congestion  Drink fluids  Tylenol is ok for pain  Take the augmentin as directed for sinus infection  Update if any new symptoms like fever or cough Update if not starting to improve in a week or if worsening

## 2019-10-25 ENCOUNTER — Telehealth: Payer: Self-pay | Admitting: Family Medicine

## 2019-10-25 DIAGNOSIS — H9192 Unspecified hearing loss, left ear: Secondary | ICD-10-CM

## 2019-10-25 NOTE — Telephone Encounter (Signed)
I put the referral through and will route to [facility]

## 2019-10-25 NOTE — Telephone Encounter (Signed)
Patient called about a referral to ENT for ear trouble She stated that this was discussed at her last visit. Patient would prefer to go to a location in [place] if possible.   Please advise

## 2019-10-26 ENCOUNTER — Telehealth: Payer: Self-pay

## 2019-10-26 NOTE — Telephone Encounter (Signed)
Opened in error

## 2019-11-06 ENCOUNTER — Ambulatory Visit: Payer: PPO | Attending: Internal Medicine

## 2019-11-06 DIAGNOSIS — Z23 Encounter for immunization: Secondary | ICD-10-CM

## 2019-11-06 NOTE — Progress Notes (Signed)
   Covid-19 Vaccination Clinic  Name:  Ashley Rasmussen    MRN: [id number] DOB: 1950/12/29  11/06/2019  [name] was observed post Covid-19 immunization for 15 minutes without incident. She was provided with Vaccine Information Sheet and instruction to access the V-Safe system.   [name] was instructed to call 911 with any severe reactions post vaccine: Marland Kitchen Difficulty breathing  . Swelling of face and throat  . A fast heartbeat  . A bad rash all over body  . Dizziness and weakness   Immunizations Administered    Name Date Dose VIS Date Route   Pfizer COVID-19 Vaccine 11/06/2019 12:12 PM 0.3 mL 07/07/2019 Intramuscular   Manufacturer: [place]   Lot: [id number]   [place]: [id number]

## 2019-11-07 ENCOUNTER — Telehealth: Payer: Self-pay | Admitting: Family Medicine

## 2019-11-07 DIAGNOSIS — Z808 Family history of malignant neoplasm of other organs or systems: Secondary | ICD-10-CM

## 2019-11-07 NOTE — Telephone Encounter (Signed)
Left VM letting pt know referral done and our [facility] could take a week or so to call and schedule appt

## 2019-11-07 NOTE — Telephone Encounter (Signed)
Referral done Will route to [facility]  Let her know it may take 1-2 wk to get a call

## 2019-11-07 NOTE — Telephone Encounter (Signed)
Patient called today requesting a referral to dermatology  She stated she had previously been referred there but she was getting over covid and was not able to make an appointment  She stated she preferred to go to somewhere in the [place] location,

## 2019-11-14 ENCOUNTER — Other Ambulatory Visit: Payer: Self-pay | Admitting: Otolaryngology

## 2019-11-14 DIAGNOSIS — H6122 Impacted cerumen, left ear: Secondary | ICD-10-CM | POA: Diagnosis not present

## 2019-11-14 DIAGNOSIS — H90A12 Conductive hearing loss, unilateral, left ear with restricted hearing on the contralateral side: Secondary | ICD-10-CM

## 2019-11-14 DIAGNOSIS — H6983 Other specified disorders of Eustachian tube, bilateral: Secondary | ICD-10-CM | POA: Diagnosis not present

## 2019-11-22 ENCOUNTER — Other Ambulatory Visit: Payer: Self-pay

## 2019-11-22 ENCOUNTER — Ambulatory Visit
Admission: RE | Admit: 2019-11-22 | Discharge: 2019-11-22 | Disposition: A | Discharge status: Home / Self Care | Payer: PPO | Source: Ambulatory Visit | Attending: Otolaryngology | Admitting: Otolaryngology

## 2019-11-22 DIAGNOSIS — H90A12 Conductive hearing loss, unilateral, left ear with restricted hearing on the contralateral side: Secondary | ICD-10-CM | POA: Diagnosis not present

## 2019-11-22 DIAGNOSIS — H9012 Conductive hearing loss, unilateral, left ear, with unrestricted hearing on the contralateral side: Secondary | ICD-10-CM | POA: Diagnosis not present

## 2019-11-27 ENCOUNTER — Ambulatory Visit: Payer: PPO

## 2019-12-05 ENCOUNTER — Ambulatory Visit: Payer: PPO | Attending: Internal Medicine

## 2019-12-05 DIAGNOSIS — Z23 Encounter for immunization: Secondary | ICD-10-CM

## 2019-12-05 NOTE — Progress Notes (Signed)
   Covid-19 Vaccination Clinic  Name:  HAVAH LAVEY    MRN: [id number] DOB: 1951-05-09  12/05/2019  Ms. Cansler was observed post Covid-19 immunization for 15 minutes without incident. She was provided with Vaccine Information Sheet and instruction to access the V-Safe system.   Ms. Cifelli was instructed to call 911 with any severe reactions post vaccine: Marland Kitchen Difficulty breathing  . Swelling of face and throat  . A fast heartbeat  . A bad rash all over body  . Dizziness and weakness   Immunizations Administered    Name Date Dose VIS Date Route   Pfizer COVID-19 Vaccine 12/05/2019 11:36 AM 0.3 mL 09/20/2018 Intramuscular   Manufacturer: [employer]   Lot: [id number]   [place]: [id number]

## 2019-12-06 ENCOUNTER — Ambulatory Visit: Admit: 2019-12-06 | Payer: PPO | Admitting: Otolaryngology

## 2019-12-06 SURGERY — MYRINGOTOMY WITH TUBE PLACEMENT
Anesthesia: General | Laterality: Bilateral

## 2019-12-15 DIAGNOSIS — H90A32 Mixed conductive and sensorineural hearing loss, unilateral, left ear with restricted hearing on the contralateral side: Secondary | ICD-10-CM | POA: Diagnosis not present

## 2019-12-15 DIAGNOSIS — H6982 Other specified disorders of Eustachian tube, left ear: Secondary | ICD-10-CM | POA: Diagnosis not present

## 2020-01-29 ENCOUNTER — Ambulatory Visit: Payer: PPO | Admitting: Dermatology

## 2020-02-12 DIAGNOSIS — H90A21 Sensorineural hearing loss, unilateral, right ear, with restricted hearing on the contralateral side: Secondary | ICD-10-CM | POA: Diagnosis not present

## 2020-02-12 DIAGNOSIS — Z01118 Encounter for examination of ears and hearing with other abnormal findings: Secondary | ICD-10-CM | POA: Diagnosis not present

## 2020-02-12 DIAGNOSIS — H7092 Unspecified mastoiditis, left ear: Secondary | ICD-10-CM | POA: Diagnosis not present

## 2020-02-12 DIAGNOSIS — H7192 Unspecified cholesteatoma, left ear: Secondary | ICD-10-CM | POA: Diagnosis not present

## 2020-03-05 ENCOUNTER — Encounter: Payer: Self-pay | Admitting: Family Medicine

## 2020-03-05 ENCOUNTER — Other Ambulatory Visit: Payer: Self-pay

## 2020-03-05 ENCOUNTER — Ambulatory Visit (INDEPENDENT_AMBULATORY_CARE_PROVIDER_SITE_OTHER): Payer: PPO | Admitting: Family Medicine

## 2020-03-05 ENCOUNTER — Ambulatory Visit: Payer: PPO | Admitting: Family Medicine

## 2020-03-05 VITALS — BP 148/82 | HR 90 | Temp 96.9°F | Ht 65.75 in | Wt 189.1 lb

## 2020-03-05 DIAGNOSIS — N898 Other specified noninflammatory disorders of vagina: Secondary | ICD-10-CM | POA: Diagnosis not present

## 2020-03-05 DIAGNOSIS — R300 Dysuria: Secondary | ICD-10-CM

## 2020-03-05 DIAGNOSIS — N952 Postmenopausal atrophic vaginitis: Secondary | ICD-10-CM | POA: Diagnosis not present

## 2020-03-05 DIAGNOSIS — R350 Frequency of micturition: Secondary | ICD-10-CM

## 2020-03-05 DIAGNOSIS — L900 Lichen sclerosus et atrophicus: Secondary | ICD-10-CM | POA: Insufficient documentation

## 2020-03-05 LAB — POC URINALSYSI DIPSTICK (AUTOMATED)
Bilirubin, UA: 1
Blood, UA: NEGATIVE
Glucose, UA: NEGATIVE
Ketones, UA: 5
Leukocytes, UA: NEGATIVE
Nitrite, UA: NEGATIVE
Protein, UA: NEGATIVE
Spec Grav, UA: >=1.03 — AB (ref 1.010–1.025)
Urobilinogen, UA: 0.2 E.U./dL
pH, UA: 6 (ref 5.0–8.0)

## 2020-03-05 LAB — POCT WET PREP (WET MOUNT): Trichomonas Wet Prep HPF POC: ABSENT

## 2020-03-05 MED ORDER — FLUCONAZOLE 150 MG PO TABS: ORAL_TABLET | ORAL | 0 refills | Status: DC

## 2020-03-05 NOTE — Patient Instructions (Addendum)
Please drink more fluid  64 oz of fluid daily -mostly water   Eat yogurt daily or get a probiotic supplement  Take diflucan one today and one in 3 days   Let us know if symptoms do not improve   There are some products over the counter to help vaginal Ph-that would also help

## 2020-03-05 NOTE — Assessment & Plan Note (Signed)
Do doubt this causes some of her irritation  Cannot use estrogen vaginal cream in light of h/o breast cancer Will treat yeast infection  May benefit from vaginal lubricant or product to balance Ph

## 2020-03-05 NOTE — Progress Notes (Signed)
Subjective:    Patient ID: Ashley Rasmussen, female    DOB: 1951/06/02, 69 y.o.   MRN: [id number]  This visit occurred during the SARS-CoV-2 public health emergency.  Safety protocols were in place, including screening questions prior to the visit, additional usage of staff PPE, and extensive cleaning of exam room while observing appropriate contact time as indicated for disinfecting solutions.    HPI Pt presents with vaginal symptoms and some dysuria   Wt Readings from Last 3 Encounters:  03/05/20 189 lb 1 oz (85.8 kg)  06/27/19 193 lb 7 oz (87.7 kg)  08/18/18 191 lb 8 oz (86.9 kg)   30.75 kg/m  Vaginal itching off/on for weeks  More outside than inside  Also vaginal dryness  No discharge  No change in odor   No new products   No pelvic pain   No new partners  Not active right now    Has frequent stools-so rectal area is usually irritated   Tried some otc products- used vag cream for itching  Used one for yeast- made her sore    ua and wet prep Results for orders placed or performed in visit on 03/05/20  POCT Urinalysis Dipstick (Automated)  Result Value Ref Range   Color, UA Yellow    Clarity, UA Clear    Glucose, UA Negative Negative   Bilirubin, UA 1 mg/dL    Ketones, UA 5 mg/dL    Spec Grav, UA >=1.030 (A) 1.010 - 1.025   Blood, UA Negative    pH, UA 6.0 5.0 - 8.0   Protein, UA Negative Negative   Urobilinogen, UA 0.2 0.2 or 1.0 E.U./dL   Nitrite, UA Negative    Leukocytes, UA Negative Negative  POCT Wet Prep ([place]et [place])  Result Value Ref Range   Source Wet Prep POC vaginal    WBC, Wet Prep HPF POC few    Bacteria Wet Prep HPF POC Few Few   BACTERIA WET PREP MORPHOLOGY POC     Clue Cells Wet Prep HPF POC None None   Clue Cells Wet Prep Whiff POC     Yeast Wet Prep HPF POC Few (A) None   KOH Wet Prep POC Few (A) None   Trichomonas Wet Prep HPF POC Absent Absent     Does not drink enough water   Has to have ear surgery- ETD/ old chronic infection   Has lost some hearing in L ear  The fluid is driving her crazy  She is covid immunized   Patient Active Problem List   Diagnosis Date Noted  . Vaginal itching 03/05/2020  . Vaginal atrophy 03/05/2020  . COVID-19 07/12/2019  . Medicare annual wellness visit, subsequent 06/27/2019  . Right knee pain 09/05/2018  . Acute sinusitis 08/18/2018  . Estrogen deficiency 01/26/2018  . Elevated glucose level 12/08/2017  . Anal itching 09/17/2017  . Family history of melanoma 11/24/2016  . Tachycardia   . Hearing loss in left ear 10/02/2015  . GERD (gastroesophageal reflux disease) 05/08/2015  . Obesity 12/13/2012  . Encounter for routine gynecological examination 08/16/2012  . Colon cancer screening 08/16/2012  . Routine general medical examination at a health care facility 08/08/2012  . Uterine prolapse 07/06/2012  . BACK PAIN, LUMBAR, WITH RADICULOPATHY 08/15/2010  . Hyperlipidemia 04/13/2008  . Osteoporosis 04/06/2008  . Allergic rhinitis 04/15/2007  . BREAST CANCER, HX OF 04/15/2007   Past Medical History:  Diagnosis Date  . Allergic rhinitis, cause unspecified   . HLD (hyperlipidemia)   .  Osteopenia   . Osteoporosis, unspecified   . Personal history of chemotherapy   . Personal history of malignant neoplasm of breast    needs yearly mammogram and MRI every 2  . Personal history of radiation therapy   . Thoracic or lumbosacral neuritis or radiculitis, unspecified    Past Surgical History:  Procedure Laterality Date  . BREAST BIOPSY Left 03/2015  . Breast cancer excision    . BREAST LUMPECTOMY Right   . COLONOSCOPY WITH PROPOFOL N/A 04/11/2018   Procedure: COLONOSCOPY WITH PROPOFOL;  Surgeon: Jonathon Bellows, MD;  Location: [facility] ENDOSCOPY;  Service: Gastroenterology;  Laterality: N/A;  . LATISSIMUS FLAP TO BREAST     due to radiation burn   Social History   Tobacco Use  . Smoking status: Former Research scientist (life sciences)  . Smokeless tobacco: Never Used  . Tobacco comment: quit over 81yrs  ago  Vaping Use  . Vaping Use: Never used  Substance Use Topics  . Alcohol use: No    Alcohol/week: 0.0 standard drinks  . Drug use: No   Family History  Problem Relation Age of Onset  . Other Mother        B-12 deficiency  . Breast cancer Mother   . Melanoma Mother   . Myelodysplastic syndrome Mother   . Osteoporosis Mother   . Other Sister        B-12 deficiency  . Breast cancer Sister   . Osteoporosis Sister   . Breast cancer Other        Multiple cousins   Allergies  Allergen Reactions  . Alendronate Sodium Other (See Comments)    REACTION: GI side effects  . Nsaids Other (See Comments)    GI upset with oral nsaids  . Pravachol Other (See Comments)    myalgias  . Raloxifene Other (See Comments)    REACTION: breast tenderness and leg pain  . Simvastatin Other (See Comments)    REACTION: myalgias   Current Outpatient Medications on File Prior to Visit  Medication Sig Dispense Refill  . acetaminophen (TYLENOL) 500 MG tablet Take 500 mg by mouth every 6 (six) hours as needed.    . cetirizine (ZYRTEC) 10 MG tablet Take 10 mg by mouth daily as needed.     . diclofenac sodium (VOLTAREN) 1 % GEL Apply 2 g topically 4 (four) times daily as needed. 1 Tube 3  . famotidine (PEPCID) 40 MG tablet Take 1 tablet (40 mg total) by mouth daily. 30 tablet 11  . hydrocortisone (ANUSOL-HC) 2.5 % rectal cream Apply to external anal area for itch once daily as needed 30 g 0  . metoprolol tartrate (LOPRESSOR) 25 MG tablet Take 1 tablet (25 mg total) by mouth 2 (two) times daily as needed (for fast heart rate or palpitations). 30 tablet 3  . Multiple Vitamin (MULTIVITAMIN) tablet Take 3 tablets by mouth daily.     Marland Kitchen omeprazole (PRILOSEC) 20 MG capsule TAKE 1 CAPSULE BY MOUTH EVERY DAY 30 capsule 11  . Phenazopyridine HCl (AZO TABS PO) Take by mouth daily as needed.     . rosuvastatin (CRESTOR) 5 MG tablet Take 1 tablet (5 mg total) by mouth daily. 30 tablet 11   No current  facility-administered medications on file prior to visit.     Review of Systems  Constitutional: Negative for activity change, appetite change, fatigue, fever and unexpected weight change.  HENT: Negative for congestion, ear pain, rhinorrhea, sinus pressure and sore throat.   Eyes: Negative for pain, redness and  visual disturbance.  Respiratory: Negative for cough, shortness of breath and wheezing.   Cardiovascular: Negative for chest pain and palpitations.  Gastrointestinal: Negative for abdominal pain, blood in stool, constipation and diarrhea.  Endocrine: Negative for polydipsia and polyuria.  Genitourinary: Positive for dysuria. Negative for flank pain, frequency, hematuria, urgency and vaginal discharge.       Vaginal itching   Musculoskeletal: Negative for arthralgias, back pain and myalgias.  Skin: Negative for pallor and rash.  Allergic/Immunologic: Negative for environmental allergies.  Neurological: Negative for dizziness, syncope and headaches.  Hematological: Negative for adenopathy. Does not bruise/bleed easily.  Psychiatric/Behavioral: Negative for decreased concentration and dysphoric mood. The patient is not nervous/anxious.        Objective:   Physical Exam Exam conducted with a chaperone present.  Constitutional:      General: She is not in acute distress.    Appearance: Normal appearance. She is obese. She is not ill-appearing.  Eyes:     General: No scleral icterus.    Conjunctiva/sclera: Conjunctivae normal.     Pupils: Pupils are equal, round, and reactive to light.  Cardiovascular:     Rate and Rhythm: Normal rate and regular rhythm.  Genitourinary:    Labia:        Right: No rash, tenderness or lesion.        Left: No rash, tenderness or lesion.      Urethra: No prolapse or urethral lesion.     Vagina: No vaginal discharge.     Comments: Nl appearing external genitalia w/o rash or skin lesions or plaques  Mucosa is somewhat dry  Wet prep swab obtained   Skin:    Coloration: Skin is not pale.     Findings: No erythema or rash.  Neurological:     Mental Status: She is alert.     Sensory: No sensory deficit.  Psychiatric:        Mood and Affect: Mood normal.           Assessment & Plan:   Problem List Items Addressed This Visit      Musculoskeletal and Integument   Vaginal itching - Primary    Suspect due to a combination of yeast vaginitis and atrophy tx with diflucan times 2 three days apart Probiotic supplement or food daily  Update if not starting to improve in a week or if worsening   A Ph balancing product like replens otc may also help in the future Not a candidate for vaginal estrogen due to her h/o breast cancer      Relevant Orders   POCT Wet Prep [facility]) (Completed)     Genitourinary   Vaginal atrophy    Do doubt this causes some of her irritation  Cannot use estrogen vaginal cream in light of h/o breast cancer Will treat yeast infection  May benefit from vaginal lubricant or product to balance Ph        Other   Dysuria    A combination of vaginitis and concentrated urine is likely the cause  Rev ua  inst to inc fluids to 64 oz per day (mostly water) Update if no imp       Other Visit Diagnoses    Urine frequency       Relevant Orders   POCT Urinalysis Dipstick (Automated) (Completed)

## 2020-03-05 NOTE — Assessment & Plan Note (Signed)
A combination of vaginitis and concentrated urine is likely the cause  Rev ua  inst to inc fluids to 64 oz per day (mostly water) Update if no imp

## 2020-03-05 NOTE — Assessment & Plan Note (Signed)
Suspect due to a combination of yeast vaginitis and atrophy tx with diflucan times 2 three days apart Probiotic supplement or food daily  Update if not starting to improve in a week or if worsening   A Ph balancing product like replens otc may also help in the future Not a candidate for vaginal estrogen due to her h/o breast cancer

## 2020-03-19 DIAGNOSIS — Z853 Personal history of malignant neoplasm of breast: Secondary | ICD-10-CM | POA: Diagnosis not present

## 2020-03-19 DIAGNOSIS — H7192 Unspecified cholesteatoma, left ear: Secondary | ICD-10-CM | POA: Diagnosis not present

## 2020-03-19 DIAGNOSIS — H6980 Other specified disorders of Eustachian tube, unspecified ear: Secondary | ICD-10-CM | POA: Diagnosis not present

## 2020-03-19 DIAGNOSIS — H6990 Unspecified Eustachian tube disorder, unspecified ear: Secondary | ICD-10-CM | POA: Diagnosis not present

## 2020-03-19 DIAGNOSIS — H902 Conductive hearing loss, unspecified: Secondary | ICD-10-CM | POA: Diagnosis not present

## 2020-03-19 DIAGNOSIS — K219 Gastro-esophageal reflux disease without esophagitis: Secondary | ICD-10-CM | POA: Diagnosis not present

## 2020-03-19 DIAGNOSIS — R609 Edema, unspecified: Secondary | ICD-10-CM | POA: Diagnosis not present

## 2020-03-19 DIAGNOSIS — H7122 Cholesteatoma of mastoid, left ear: Secondary | ICD-10-CM | POA: Diagnosis not present

## 2020-04-12 DIAGNOSIS — Z1159 Encounter for screening for other viral diseases: Secondary | ICD-10-CM | POA: Diagnosis not present

## 2020-04-12 DIAGNOSIS — Z20828 Contact with and (suspected) exposure to other viral communicable diseases: Secondary | ICD-10-CM | POA: Diagnosis not present

## 2020-05-20 ENCOUNTER — Telehealth: Payer: Self-pay | Admitting: Family Medicine

## 2020-05-20 MED ORDER — FLUCONAZOLE 150 MG PO TABS: 150.0000 mg | ORAL_TABLET | Freq: Once | ORAL | 0 refills | Status: AC

## 2020-05-20 NOTE — Telephone Encounter (Signed)
PT called in wanted to know about getting a prescription from the yeast infection. And she cant come in due to she had ear surgery and she unbalanced.

## 2020-05-20 NOTE — Telephone Encounter (Signed)
Pt.notified

## 2020-05-20 NOTE — Telephone Encounter (Signed)
Spoke to patient and was advised that she is having external vaginal itching. Patient denies any discharge. Patient stated that she has tried OTC monistat 7 which has not helped. Patient stated that last time she had a yeast infection the pill that you gave her really helped. Pharmacy [facility]

## 2020-05-20 NOTE — Telephone Encounter (Signed)
I sent in diflucan  F/u with me if no improvement

## 2020-05-20 NOTE — Telephone Encounter (Signed)
What symptoms is she having?  Any otc treatment?  thanks

## 2020-05-21 ENCOUNTER — Other Ambulatory Visit: Payer: Self-pay | Admitting: Family Medicine

## 2020-06-12 DIAGNOSIS — Z23 Encounter for immunization: Secondary | ICD-10-CM | POA: Diagnosis not present

## 2020-06-28 ENCOUNTER — Other Ambulatory Visit: Payer: Self-pay | Admitting: Family Medicine

## 2020-06-28 DIAGNOSIS — Z1231 Encounter for screening mammogram for malignant neoplasm of breast: Secondary | ICD-10-CM

## 2020-08-19 ENCOUNTER — Telehealth: Payer: Self-pay | Admitting: Family Medicine

## 2020-08-20 NOTE — Telephone Encounter (Signed)
Pharmacy requests refill on: Omeprazole 20 mg   LAST REFILL: 05/21/2020 (Q-90, R-0) LAST OV: 03/05/2020 NEXT OV: Not Scheduled  PHARMACY: [facility] [place], [place]

## 2020-08-20 NOTE — Telephone Encounter (Signed)
Please schedule PE and refill until then  

## 2020-08-20 NOTE — Telephone Encounter (Signed)
Pt has had a few recent acute appts but last CPE was 06/27/19 and no futuer appts, please advise

## 2020-08-23 NOTE — Telephone Encounter (Signed)
Patient scheduled cpx on 09/27/20.

## 2020-09-03 ENCOUNTER — Other Ambulatory Visit: Payer: Self-pay | Admitting: Family Medicine

## 2020-09-16 ENCOUNTER — Other Ambulatory Visit: Payer: Self-pay

## 2020-09-16 ENCOUNTER — Ambulatory Visit
Admission: RE | Admit: 2020-09-16 | Discharge: 2020-09-16 | Disposition: A | Discharge status: Home / Self Care | Payer: PPO | Source: Ambulatory Visit | Attending: Family Medicine | Admitting: Family Medicine

## 2020-09-16 DIAGNOSIS — Z1231 Encounter for screening mammogram for malignant neoplasm of breast: Secondary | ICD-10-CM | POA: Diagnosis not present

## 2020-09-20 ENCOUNTER — Other Ambulatory Visit: Payer: Self-pay | Admitting: Family Medicine

## 2020-09-20 DIAGNOSIS — R928 Other abnormal and inconclusive findings on diagnostic imaging of breast: Secondary | ICD-10-CM

## 2020-09-24 ENCOUNTER — Telehealth: Payer: Self-pay | Admitting: Family Medicine

## 2020-09-24 DIAGNOSIS — M810 Age-related osteoporosis without current pathological fracture: Secondary | ICD-10-CM

## 2020-09-24 DIAGNOSIS — E7849 Other hyperlipidemia: Secondary | ICD-10-CM

## 2020-09-24 DIAGNOSIS — R7309 Other abnormal glucose: Secondary | ICD-10-CM

## 2020-09-24 DIAGNOSIS — Z0000 Encounter for general adult medical examination without abnormal findings: Secondary | ICD-10-CM

## 2020-09-24 NOTE — Telephone Encounter (Signed)
-----   Message from Cloyd Stagers, RT sent at 09/09/2020  2:11 PM EST ----- Regarding: Lab Orders for Wednesday 3.2.2022 Please place lab orders for Wednesday 3.2.2022, office visit for physical on Friday 3.4.2022 Thank you, Dyke Maes RT(R)

## 2020-09-25 ENCOUNTER — Ambulatory Visit: Payer: PPO

## 2020-09-25 ENCOUNTER — Other Ambulatory Visit (INDEPENDENT_AMBULATORY_CARE_PROVIDER_SITE_OTHER): Payer: PPO

## 2020-09-25 ENCOUNTER — Telehealth: Payer: Self-pay

## 2020-09-25 ENCOUNTER — Other Ambulatory Visit: Payer: Self-pay

## 2020-09-25 DIAGNOSIS — E7849 Other hyperlipidemia: Secondary | ICD-10-CM | POA: Diagnosis not present

## 2020-09-25 DIAGNOSIS — R7309 Other abnormal glucose: Secondary | ICD-10-CM

## 2020-09-25 DIAGNOSIS — Z0000 Encounter for general adult medical examination without abnormal findings: Secondary | ICD-10-CM

## 2020-09-25 DIAGNOSIS — M810 Age-related osteoporosis without current pathological fracture: Secondary | ICD-10-CM

## 2020-09-25 LAB — COMPREHENSIVE METABOLIC PANEL
ALT: 11 U/L (ref 0–35)
AST: 11 U/L (ref 0–37)
Albumin: 4 g/dL (ref 3.5–5.2)
Alkaline Phosphatase: 101 U/L (ref 39–117)
BUN: 15 mg/dL (ref 6–23)
CO2: 30 mEq/L (ref 19–32)
Calcium: 9.8 mg/dL (ref 8.4–10.5)
Chloride: 107 mEq/L (ref 96–112)
Creatinine, Ser: 0.81 mg/dL (ref 0.40–1.20)
GFR: 73.59 mL/min (ref >60.00)
Glucose, Bld: 109 mg/dL — ABNORMAL HIGH (ref 70–99)
Potassium: 4.1 mEq/L (ref 3.5–5.1)
Sodium: 141 mEq/L (ref 135–145)
Total Bilirubin: 0.6 mg/dL (ref 0.2–1.2)
Total Protein: 6.3 g/dL (ref 6.0–8.3)

## 2020-09-25 LAB — CBC WITH DIFFERENTIAL/PLATELET
Basophils Absolute: 0 10*3/uL (ref 0.0–0.1)
Basophils Relative: 0.7 % (ref 0.0–3.0)
Eosinophils Absolute: 0.1 10*3/uL (ref 0.0–0.7)
Eosinophils Relative: 1.8 % (ref 0.0–5.0)
HCT: 43.1 % (ref 36.0–46.0)
Hemoglobin: 14.4 g/dL (ref 12.0–15.0)
Lymphocytes Relative: 36.3 % (ref 12.0–46.0)
Lymphs Abs: 2.2 10*3/uL (ref 0.7–4.0)
MCHC: 33.5 g/dL (ref 30.0–36.0)
MCV: 87.5 fl (ref 78.0–100.0)
Monocytes Absolute: 0.4 10*3/uL (ref 0.1–1.0)
Monocytes Relative: 7.1 % (ref 3.0–12.0)
Neutro Abs: 3.3 10*3/uL (ref 1.4–7.7)
Neutrophils Relative %: 54.1 % (ref 43.0–77.0)
Platelets: 247 10*3/uL (ref 150.0–400.0)
RBC: 4.92 Mil/uL (ref 3.87–5.11)
RDW: 13.6 % (ref 11.5–15.5)
WBC: 6.1 10*3/uL (ref 4.0–10.5)

## 2020-09-25 LAB — LIPID PANEL
Cholesterol: 252 mg/dL — ABNORMAL HIGH (ref 0–200)
HDL: 46.3 mg/dL (ref >39.00)
LDL Cholesterol: 172 mg/dL — ABNORMAL HIGH (ref 0–99)
NonHDL: 205.73
Total CHOL/HDL Ratio: 5
Triglycerides: 169 mg/dL — ABNORMAL HIGH (ref 0.0–149.0)
VLDL: 33.8 mg/dL (ref 0.0–40.0)

## 2020-09-25 LAB — HEMOGLOBIN A1C: Hgb A1c MFr Bld: 5.9 % (ref 4.6–6.5)

## 2020-09-25 NOTE — Progress Notes (Deleted)
Subjective:   Ashley Rasmussen is a 70 y.o. female who presents for Medicare Annual (Subsequent) preventive examination.  Review of Systems: N/A      I connected with the patient today by telephone and verified that I am speaking with the correct person using two identifiers. Location patient: home Location nurse: work Persons participating in the telephone visit: patient, nurse.   I discussed the limitations, risks, security and privacy concerns of performing an evaluation and management service by telephone and the availability of in person appointments. I also discussed with the patient that there may be a patient responsible charge related to this service. The patient expressed understanding and verbally consented to this telephonic visit.              Objective:    There were no vitals filed for this visit. There is no height or weight on file to calculate BMI.  Advanced Directives 04/11/2018 12/09/2017 09/29/2016 09/29/2016 09/26/2015  Does Patient Have a Medical Advance Directive? No No No No No  Would patient like information on creating a medical advance directive? No - Patient declined Yes (MAU/Ambulatory/Procedural Areas - Information given) Yes (Inpatient - patient requests chaplain consult to create a medical advance directive) No - Patient declined Yes - Educational materials given    Current Medications (verified) Outpatient Encounter Medications as of 09/25/2020  Medication Sig  . acetaminophen (TYLENOL) 500 MG tablet Take 500 mg by mouth every 6 (six) hours as needed.  . cetirizine (ZYRTEC) 10 MG tablet Take 10 mg by mouth daily as needed.   . diclofenac sodium (VOLTAREN) 1 % GEL Apply 2 g topically 4 (four) times daily as needed.  . famotidine (PEPCID) 40 MG tablet Take 1 tablet (40 mg total) by mouth daily.  . fluconazole (DIFLUCAN) 150 MG tablet Take one pill by mouth now and one pill in 3 days  . hydrocortisone (ANUSOL-HC) 2.5 % rectal cream Apply to external anal area  for itch once daily as needed  . metoprolol tartrate (LOPRESSOR) 25 MG tablet Take 1 tablet (25 mg total) by mouth 2 (two) times daily as needed (for fast heart rate or palpitations).  . Multiple Vitamin (MULTIVITAMIN) tablet Take 3 tablets by mouth daily.   Marland Kitchen omeprazole (PRILOSEC) 20 MG capsule TAKE 1 CAPSULE BY MOUTH EVERY DAY  . Phenazopyridine HCl (AZO TABS PO) Take by mouth daily as needed.   . rosuvastatin (CRESTOR) 5 MG tablet Take 1 tablet (5 mg total) by mouth daily.   No facility-administered encounter medications on file as of 09/25/2020.    Allergies (verified) Alendronate sodium, Nsaids, Pravachol, Raloxifene, and Simvastatin   History: Past Medical History:  Diagnosis Date  . Allergic rhinitis, cause unspecified   . HLD (hyperlipidemia)   . Osteopenia   . Osteoporosis, unspecified   . Personal history of chemotherapy   . Personal history of malignant neoplasm of breast    needs yearly mammogram and MRI every 2  . Personal history of radiation therapy   . Thoracic or lumbosacral neuritis or radiculitis, unspecified    Past Surgical History:  Procedure Laterality Date  . BREAST BIOPSY Left 03/2015  . Breast cancer excision    . BREAST LUMPECTOMY Right   . COLONOSCOPY WITH PROPOFOL N/A 04/11/2018   Procedure: COLONOSCOPY WITH PROPOFOL;  Surgeon: Jonathon Bellows, MD;  Location: [facility] ENDOSCOPY;  Service: Gastroenterology;  Laterality: N/A;  . LATISSIMUS FLAP TO BREAST     due to radiation burn   Family History  Problem Relation Age of Onset  . Other Mother        B-12 deficiency  . Breast cancer Mother   . Melanoma Mother   . Myelodysplastic syndrome Mother   . Osteoporosis Mother   . Other Sister        B-12 deficiency  . Breast cancer Sister   . Osteoporosis Sister   . Breast cancer Other        Multiple cousins   Social History   Socioeconomic History  . Marital status: Married    Spouse name: Not on file  . Number of children: Not on file  . Years of  education: Not on file  . Highest education level: Not on file  Occupational History  . Not on file  Tobacco Use  . Smoking status: Former Research scientist (life sciences)  . Smokeless tobacco: Never Used  . Tobacco comment: quit over 31yrs ago  Vaping Use  . Vaping Use: Never used  Substance and Sexual Activity  . Alcohol use: No    Alcohol/week: 0.0 standard drinks  . Drug use: No  . Sexual activity: Never  Other Topics Concern  . Not on file  Social History Narrative   Married      4 children         Social Determinants of Health   Financial Resource Strain: Not on file  Food Insecurity: Not on file  Transportation Needs: Not on file  Physical Activity: Not on file  Stress: Not on file  Social Connections: Not on file    Tobacco Counseling Counseling given: Not Answered Comment: quit over 59yrs ago   Clinical Intake:                 Diabetic: No Nutrition Risk Assessment:  Has the patient had any N/V/D within the last 2 months?  {YES/NO:21197} Does the patient have any non-healing wounds?  {YES/NO:21197} Has the patient had any unintentional weight loss or weight gain?  {YES/NO:21197}  Diabetes:  Is the patient diabetic?  No  If diabetic, was a CBG obtained today?  N/A Did the patient bring in their glucometer from home?  N/A How often do you monitor your CBG's? N/A.   Financial Strains and Diabetes Management:  Are you having any financial strains with the device, your supplies or your medication? N/A.  Does the patient want to be seen by Chronic Care Management for management of their diabetes?  N/A Would the patient like to be referred to a Nutritionist or for Diabetic Management?  N/A            Activities of Daily Living No flowsheet data found.  Patient Care Team: Tower, [name], MD as PCP - General  Indicate any recent Medical Services you may have received from other than [facility] providers in the past year (date may be approximate).      Assessment:   This is a routine wellness examination for Ashley Rasmussen.  Hearing/Vision screen No exam data present  Dietary issues and exercise activities discussed:    Goals    . Exercise 3x per week (30 min per time)     When weather permits, I will continue to walk 15- 30 minutes daily.       Depression Screen PHQ 2/9 Scores 06/27/2019 12/09/2017 11/24/2016 09/26/2015  PHQ - 2 Score 0 0 0 0  PHQ- 9 Score - 0 0 -    Fall Risk Fall Risk  06/21/2019 02/24/2019 12/09/2017 11/24/2016 09/26/2015  Falls in the past  year? 0 (No Data) No No No  Comment Emmi Telephone Survey: data to providers prior to load Emmi Telephone Survey: data to providers prior to load - - -  Number falls in past yr: - (No Data) - - -  Comment - Emmi Telephone Survey Actual Response =  - - -    FALL RISK PREVENTION PERTAINING TO THE HOME:  Any stairs in or around the home? Yes  If so, are there any without handrails? No  Home free of loose throw rugs in walkways, pet beds, electrical cords, etc? Yes  Adequate lighting in your home to reduce risk of falls? Yes   ASSISTIVE DEVICES UTILIZED TO PREVENT FALLS:  Life alert? {YES/NO:21197} Use of a cane, walker or w/c? {YES/NO:21197} Grab bars in the bathroom? {YES/NO:21197} Shower chair or bench in shower? {YES/NO:21197} Elevated toilet seat or a handicapped toilet? {YES/NO:21197}  TIMED UP AND GO:  Was the test performed? N/A telephone visit .    Cognitive Function: MMSE - Mini Mental State Exam 12/09/2017 09/26/2015  Orientation to time 5 5  Orientation to Place 5 5  Registration 3 3  Attention/ Calculation 0 5  Recall 2 3  Language- name 2 objects 0 0  Language- repeat 1 1  Language- follow 3 step command 3 3  Language- read & follow direction 0 1  Write a sentence 0 0  Copy design 0 0  Total score 19 26  Mini Cog  Mini-Cog screen was completed. Maximum score is 22. A value of 0 denotes this part of the MMSE was not completed or the patient failed this part  of the Mini-Cog screening.       Immunizations Immunization History  Administered Date(s) Administered  . Fluad Quad(high Dose 65+) 05/31/2020  . Influenza Split 06/21/2012  . Influenza Whole 07/13/2002  . Influenza,inj,Quad PF,6+ Mos 05/23/2013, 09/26/2015, 05/22/2016, 06/03/2017, 05/12/2018, 03/21/2019  . PFIZER(Purple Top)SARS-COV-2 Vaccination 11/06/2019, 12/05/2019, 06/12/2020  . Pneumococcal Conjugate-13 09/26/2015  . Pneumococcal Polysaccharide-23 11/24/2016  . Td 05/27/2005  . Tdap 11/24/2016    TDAP status: Up to date  Flu Vaccine status: Up to date  Pneumococcal vaccine status: Up to date  Covid-19 vaccine status: Completed vaccines  Qualifies for Shingles Vaccine? Yes   Zostavax completed No   Shingrix Completed?: No.    Education has been provided regarding the importance of this vaccine. Patient has been advised to call insurance company to determine out of pocket expense if they have not yet received this vaccine. Advised may also receive vaccine at local pharmacy or Health Dept. Verbalized acceptance and understanding.  Screening Tests Health Maintenance  Topic Date Due  . MAMMOGRAM  09/16/2021  . TETANUS/TDAP  11/25/2026  . COLONOSCOPY (Pts 45-46yrs Insurance coverage will need to be confirmed)  04/11/2028  . INFLUENZA VACCINE  Completed  . DEXA SCAN  Completed  . COVID-19 Vaccine  Completed  . Hepatitis C Screening  Completed  . PNA vac Low Risk Adult  Completed  . HPV VACCINES  Aged Out    Health Maintenance  There are no preventive care reminders to display for this patient.  Colorectal cancer screening: Type of screening: Colonoscopy. Completed 05/04/2018. Repeat every 10 years  Mammogram status: Completed 09/16/2020. Repeat every year  {Bone Density status:21018021}  Lung Cancer Screening: (Low Dose CT Chest recommended if Age 85-80 years, 30 pack-year currently smoking OR have quit w/in 15years.) does not qualify.    Additional  Screening:  Hepatitis C Screening: does qualify; Completed 09/27/2015  Vision Screening: Recommended annual ophthalmology exams for early detection of glaucoma and other disorders of the eye. Is the patient up to date with their annual eye exam?  {YES/NO:21197} Who is the provider or what is the name of the office in which the patient attends annual eye exams? *** If pt is not established with a provider, would they like to be referred to a provider to establish care? No .   Dental Screening: Recommended annual dental exams for proper oral hygiene  Community Resource Referral / Chronic Care Management: CRR required this visit?  No   CCM required this visit?  No      Plan:     I have personally reviewed and noted the following in the patient's chart:   . Medical and social history . Use of alcohol, tobacco or illicit drugs  . Current medications and supplements . Functional ability and status . Nutritional status . Physical activity . Advanced directives . List of other physicians . Hospitalizations, surgeries, and ER visits in previous 12 months . Vitals . Screenings to include cognitive, depression, and falls . Referrals and appointments  In addition, I have reviewed and discussed with patient certain preventive protocols, quality metrics, and best practice recommendations. A written personalized care plan for preventive services as well as general preventive health recommendations were provided to patient.   Due to this being a telephonic visit, the after visit summary with patients personalized plan was offered to patient via office or my-chart. Patient preferred to pick up at office at next visit or via mychart.   Andrez Grime, LPN   10/28/1538

## 2020-09-25 NOTE — Telephone Encounter (Signed)
Called patient 3 times trying to complete her AWV. Patient never answered the phone. Left message on voicemail notifying patient I called and to call and reschedule or provider might complete at her upcoming physical. Appointment was cancelled

## 2020-09-26 LAB — VITAMIN D 25 HYDROXY (VIT D DEFICIENCY, FRACTURES): VITD: 31.38 ng/mL (ref 30.00–100.00)

## 2020-09-26 LAB — TSH: TSH: 1.28 u[IU]/mL (ref 0.35–4.50)

## 2020-09-27 ENCOUNTER — Ambulatory Visit (INDEPENDENT_AMBULATORY_CARE_PROVIDER_SITE_OTHER): Payer: PPO | Admitting: Family Medicine

## 2020-09-27 ENCOUNTER — Other Ambulatory Visit: Payer: Self-pay

## 2020-09-27 ENCOUNTER — Encounter: Payer: Self-pay | Admitting: Family Medicine

## 2020-09-27 VITALS — BP 130/90 | HR 101 | Temp 97.9°F | Ht 66.0 in | Wt 188.2 lb

## 2020-09-27 DIAGNOSIS — Z0000 Encounter for general adult medical examination without abnormal findings: Secondary | ICD-10-CM | POA: Diagnosis not present

## 2020-09-27 DIAGNOSIS — N898 Other specified noninflammatory disorders of vagina: Secondary | ICD-10-CM | POA: Diagnosis not present

## 2020-09-27 DIAGNOSIS — Z808 Family history of malignant neoplasm of other organs or systems: Secondary | ICD-10-CM | POA: Diagnosis not present

## 2020-09-27 DIAGNOSIS — R7309 Other abnormal glucose: Secondary | ICD-10-CM | POA: Diagnosis not present

## 2020-09-27 DIAGNOSIS — E7849 Other hyperlipidemia: Secondary | ICD-10-CM | POA: Diagnosis not present

## 2020-09-27 DIAGNOSIS — E6609 Other obesity due to excess calories: Secondary | ICD-10-CM

## 2020-09-27 DIAGNOSIS — Z853 Personal history of malignant neoplasm of breast: Secondary | ICD-10-CM

## 2020-09-27 DIAGNOSIS — M810 Age-related osteoporosis without current pathological fracture: Secondary | ICD-10-CM | POA: Diagnosis not present

## 2020-09-27 DIAGNOSIS — Z6830 Body mass index (BMI) 30.0-30.9, adult: Secondary | ICD-10-CM | POA: Diagnosis not present

## 2020-09-27 DIAGNOSIS — H9192 Unspecified hearing loss, left ear: Secondary | ICD-10-CM

## 2020-09-27 MED ORDER — KETOCONAZOLE 2 % EX CREA: 1.0000 "application " | TOPICAL_CREAM | Freq: Two times a day (BID) | CUTANEOUS | 3 refills | Status: DC | PRN

## 2020-09-27 MED ORDER — FAMOTIDINE 40 MG PO TABS: 40.0000 mg | ORAL_TABLET | Freq: Every day | ORAL | 3 refills | Status: DC

## 2020-09-27 MED ORDER — FAMOTIDINE 40 MG PO TABS: 40.0000 mg | ORAL_TABLET | Freq: Every day | ORAL | 11 refills | Status: DC

## 2020-09-27 NOTE — Assessment & Plan Note (Signed)
Reviewed health habits including diet and exercise and skin cancer prevention Reviewed appropriate screening tests for age  Also reviewed health mt list, fam hx and immunization status , as well as social and family history   See HPI Labs reviewed  Pending mammogram re check  Will check on coverage of shingrix  Declines dexa (no falls or fx)  Given materials to work on [employer] directive No cognitive concerns  Baseline hearing loss-continues otol f/u for that  Stable vision , enc yearly eye care

## 2020-09-27 NOTE — Assessment & Plan Note (Signed)
Cholesteatoma  May require another procedure  Sees otolaryngology

## 2020-09-27 NOTE — Patient Instructions (Addendum)
If you are interested in the new shingles vaccine (Shingrix) - call your local pharmacy to check on coverage and availability  If affordable, get on a wait list at your pharmacy to get the vaccine.  Any probiotic is helpful re: preventing yeast or eating yogurt   Use the blue booklet to work on your advance directive and get it notarized   Get a regular dermatology visit for screening   For cholesterol Avoid red meat/ fried foods/ egg yolks/ fatty breakfast meats/ butter, cheese and high fat dairy/ and shellfish   Let's re check your cholesterol in 2-3 months  Schedule a lab appointment   Try the ketoconazole cream for yeast rash in groin and under breasts  Let me know if it does not help  Keep areas as clean and dry as possible

## 2020-09-27 NOTE — Assessment & Plan Note (Signed)
Pending re check R mammogram for calcifications Interested in whether a genetic w/u could be done affordably  She would consider proph mastectomy if she could get it

## 2020-09-27 NOTE — Assessment & Plan Note (Signed)
Disc goals for lipids and reasons to control them Rev last labs with pt Rev low sat fat diet in detail Intolerant of ever low intermittent dose of statin  Will work on diet and re check 2-3 mo

## 2020-09-27 NOTE — Assessment & Plan Note (Signed)
Due for derm visit for screening She plans to schedule it

## 2020-09-27 NOTE — Assessment & Plan Note (Signed)
Lab Results  Component Value Date   HGBA1C 5.9 09/25/2020   disc imp of low glycemic diet and wt loss to prevent DM2

## 2020-09-27 NOTE — Assessment & Plan Note (Signed)
Discussed how this problem influences overall health and the risks it imposes  Reviewed plan for weight loss with lower calorie diet (via better food choices and also portion control or program like weight watchers) and exercise building up to or more than 30 minutes 5 days per week including some aerobic activity    

## 2020-09-27 NOTE — Assessment & Plan Note (Signed)
Now just external and under breasts  ? If intertrigo Trial of ketoconazole cream and update

## 2020-09-27 NOTE — Progress Notes (Signed)
Subjective:    Patient ID: Ashley Rasmussen, female    DOB: [birth year], 70 y.o.   MRN: [id number]  This visit occurred during the SARS-CoV-2 public health emergency.  Safety protocols were in place, including screening questions prior to the visit, additional usage of staff PPE, and extensive cleaning of exam room while observing appropriate contact time as indicated for disinfecting solutions.    HPI Pt presents for amw and health mt exam   I have personally reviewed the Medicare Annual Wellness questionnaire and have noted 1. The patient's medical and social history 2. Their use of alcohol, tobacco or illicit drugs 3. Their current medications and supplements 4. The patient's functional ability including ADL's, fall risks, home safety risks and hearing or visual             impairment. 5. Diet and physical activities 6. Evidence for depression or mood disorders  The patients weight, height, BMI have been recorded in the chart and visual acuity is per eye clinic.  I have made referrals, counseling and provided education to the patient based review of the above and I have provided the pt with a written personalized care plan for preventive services. Reviewed and updated provider list, see scanned forms.  See scanned forms.  Routine anticipatory guidance given to patient.  See health maintenance. Colon cancer screening  Colonoscopy 9/19  Breast cancer screening   Mammogram 2/22 with addn view planned for next week for calcifications in R breast  Strong family hx of breast cancer and personal h/o breast cancer Self breast exam- no lumps  She would be interested in genetic testing and possible mastectomy (if affordable)   Flu vaccine 11/21 covid-immunized with booster Tetanus vaccine  Tdap 5/18 Pneumovax complete Zoster vaccine -interested if covered  Dexa  9/09  Osteopenia (h/o OP)- declines another dexa right now  Had tamoxifen in the past  Sister had osteoporosis  Falls-none   Fractures-none  Supplements ca and D  D level is 31.3  Exercise -walking   Advance directive-needs to do  Cognitive function addressed- see scanned forms- and if abnormal then additional documentation follows.   No problems at all / very active brain wise   PMH and SH reviewed  Meds, vitals, and allergies reviewed.   ROS: See HPI.  Otherwise negative.    Weight : Wt Readings from Last 3 Encounters:  09/27/20 188 lb 3 oz (85.4 kg)  03/05/20 189 lb 1 oz (85.8 kg)  06/27/19 193 lb 7 oz (87.7 kg)   30.37 kg/m  Trying to loose weight  Eating more salads and less junk food  Walking  Sometimes skips lunch    Hearing/vision:  Hearing Screening   125Hz  250Hz  500Hz  1000Hz  2000Hz  3000Hz  4000Hz  6000Hz  8000Hz   Right ear:  20 40 40 40  0    Left ear:  20 0 0 0  0      Visual Acuity Screening   Right eye Left eye Both eyes  Without correction: 20/30 20/40 20/25   With correction:      Planning another ear surgery at the end of the month   Last vision/eye visit was 2 y ago    Care team  [facility]- otol[facility]ology , [facility]- GI  Derm  Mother had melanoma  Due for a visit   BP Readings from Last 3 Encounters:  09/27/20 130/90  03/05/20 (!) 148/82  07/14/19 (!) 155/83   Pulse Readings from Last 3 Encounters:  09/27/20 (!) 101  03/05/20 90  07/14/19 85   Takes metoprolol prn for tachycardia    Elevated glucose Lab Results  Component Value Date   HGBA1C 5.9 09/25/2020    Hyperlipidemia  Lab Results  Component Value Date   CHOL 252 (H) 09/25/2020   CHOL 224 (H) 06/19/2019   CHOL 209 (H) 12/09/2017   Lab Results  Component Value Date   HDL 46.30 09/25/2020   HDL 42.20 06/19/2019   HDL 42.00 12/09/2017   Lab Results  Component Value Date   LDLCALC 172 (H) 09/25/2020   LDLCALC 153 (H) 06/19/2019   LDLCALC 130 (H) 12/09/2017   Lab Results  Component Value Date   TRIG 169.0 (H) 09/25/2020   TRIG 144.0 06/19/2019   TRIG 185.0 (H)  12/09/2017   Lab Results  Component Value Date   CHOLHDL 5 09/25/2020   CHOLHDL 5 06/19/2019   CHOLHDL 5 12/09/2017   Lab Results  Component Value Date   LDLDIRECT 153.0 11/24/2016   LDLDIRECT 177.0 09/27/2015   LDLDIRECT 140.9 08/09/2012   Tried crestor -had side effect /myalgia even with once per week  Eating more microwave meals   She has flare ups of yeast -is external more than internal  Uses the otc cream  Diflucan  Anti itch cream  Symptoms are primarily external   Lab Results  Component Value Date   WBC 6.1 09/25/2020   HGB 14.4 09/25/2020   HCT 43.1 09/25/2020   MCV 87.5 09/25/2020   PLT 247.0 09/25/2020   Lab Results  Component Value Date   CREATININE 0.81 09/25/2020   BUN 15 09/25/2020   NA 141 09/25/2020   K 4.1 09/25/2020   CL 107 09/25/2020   CO2 30 09/25/2020   Lab Results  Component Value Date   ALT 11 09/25/2020   AST 11 09/25/2020   ALKPHOS 101 09/25/2020   BILITOT 0.6 09/25/2020   Lab Results  Component Value Date   TSH 1.28 09/25/2020    Patient Active Problem List   Diagnosis Date Noted  . Vaginal itching 03/05/2020  . Vaginal atrophy 03/05/2020  . Medicare annual wellness visit, subsequent 06/27/2019  . Estrogen deficiency 01/26/2018  . Elevated glucose level 12/08/2017  . Family history of melanoma 11/24/2016  . Tachycardia   . Hearing loss in left ear 10/02/2015  . GERD (gastroesophageal reflux disease) 05/08/2015  . Obesity 12/13/2012  . Encounter for routine gynecological examination 08/16/2012  . Colon cancer screening 08/16/2012  . Routine general medical examination at a health care facility 08/08/2012  . Uterine prolapse 07/06/2012  . BACK PAIN, LUMBAR, WITH RADICULOPATHY 08/15/2010  . Hyperlipidemia 04/13/2008  . Osteoporosis 04/06/2008  . Allergic rhinitis 04/15/2007  . BREAST CANCER, HX OF 04/15/2007   Past Medical History:  Diagnosis Date  . Allergic rhinitis, cause unspecified   . HLD (hyperlipidemia)   .  Osteopenia   . Osteoporosis, unspecified   . Personal history of chemotherapy   . Personal history of malignant neoplasm of breast    needs yearly mammogram and MRI every 2  . Personal history of radiation therapy   . Thoracic or lumbosacral neuritis or radiculitis, unspecified    Past Surgical History:  Procedure Laterality Date  . BREAST BIOPSY Left 03/2015  . Breast cancer excision    . BREAST LUMPECTOMY Right   . COLONOSCOPY WITH PROPOFOL N/A 04/11/2018   Procedure: COLONOSCOPY WITH PROPOFOL;  Surgeon: Jonathon Bellows, MD;  Location: [facility] ENDOSCOPY;  Service: Gastroenterology;  Laterality: N/A;  . LATISSIMUS  FLAP TO BREAST     due to radiation burn   Social History   Tobacco Use  . Smoking status: Former Research scientist (life sciences)  . Smokeless tobacco: Never Used  . Tobacco comment: quit over 106yrs ago  Vaping Use  . Vaping Use: Never used  Substance Use Topics  . Alcohol use: No    Alcohol/week: 0.0 standard drinks  . Drug use: No   Family History  Problem Relation Age of Onset  . Other Mother        B-12 deficiency  . Breast cancer Mother   . Melanoma Mother   . Myelodysplastic syndrome Mother   . Osteoporosis Mother   . Other Sister        B-12 deficiency  . Breast cancer Sister   . Osteoporosis Sister   . Breast cancer Other        Multiple cousins   Allergies  Allergen Reactions  . Crestor [Rosuvastatin]     Myalgia  Even once weekly dose  . Alendronate Sodium Other (See Comments)    REACTION: GI side effects  . Nsaids Other (See Comments)    GI upset with oral nsaids  . Pravachol Other (See Comments)    myalgias  . Raloxifene Other (See Comments)    REACTION: breast tenderness and leg pain  . Simvastatin Other (See Comments)    REACTION: myalgias   Current Outpatient Medications on File Prior to Visit  Medication Sig Dispense Refill  . acetaminophen (TYLENOL) 500 MG tablet Take 500 mg by mouth every 6 (six) hours as needed.    . cetirizine (ZYRTEC) 10 MG tablet Take  10 mg by mouth daily as needed.     . diclofenac sodium (VOLTAREN) 1 % GEL Apply 2 g topically 4 (four) times daily as needed. 1 Tube 3  . hydrocortisone (ANUSOL-HC) 2.5 % rectal cream Apply to external anal area for itch once daily as needed 30 g 0  . metoprolol tartrate (LOPRESSOR) 25 MG tablet Take 1 tablet (25 mg total) by mouth 2 (two) times daily as needed (for fast heart rate or palpitations). 30 tablet 3  . Multiple Vitamin (MULTIVITAMIN) tablet Take 3 tablets by mouth daily.    Marland Kitchen omeprazole (PRILOSEC) 20 MG capsule TAKE 1 CAPSULE BY MOUTH EVERY DAY 90 capsule 0  . Phenazopyridine HCl (AZO TABS PO) Take by mouth daily as needed.      No current facility-administered medications on file prior to visit.    Review of Systems  Constitutional: Negative for activity change, appetite change, fatigue, fever and unexpected weight change.  HENT: Negative for congestion, ear pain, rhinorrhea, sinus pressure and sore throat.   Eyes: Negative for pain, redness and visual disturbance.  Respiratory: Negative for cough, shortness of breath and wheezing.   Cardiovascular: Negative for chest pain and palpitations.  Gastrointestinal: Negative for abdominal pain, blood in stool, constipation and diarrhea.  Endocrine: Negative for polydipsia and polyuria.  Genitourinary: Negative for dysuria, frequency and urgency.  Musculoskeletal: Negative for arthralgias, back pain and myalgias.  Skin: Positive for rash. Negative for pallor.  Allergic/Immunologic: Negative for environmental allergies.  Neurological: Negative for dizziness, syncope and headaches.  Hematological: Negative for adenopathy. Does not bruise/bleed easily.  Psychiatric/Behavioral: Negative for decreased concentration and dysphoric mood. The patient is not nervous/anxious.        Objective:   Physical Exam Constitutional:      General: She is not in acute distress.    Appearance: Normal appearance. She is well-developed. She  is obese.  She is not ill-appearing or diaphoretic.  HENT:     Head: Normocephalic and atraumatic.     Right Ear: Tympanic membrane, ear canal and external ear normal.     Left Ear: Tympanic membrane, ear canal and external ear normal.     Nose: Nose normal. No congestion.     Mouth/Throat:     Mouth: Mucous membranes are moist.     Pharynx: Oropharynx is clear. No posterior oropharyngeal erythema.  Eyes:     General: No scleral icterus.    Extraocular Movements: Extraocular movements intact.     Conjunctiva/sclera: Conjunctivae normal.     Pupils: Pupils are equal, round, and reactive to light.  Neck:     Thyroid: No thyromegaly.     Vascular: No carotid bruit or JVD.  Cardiovascular:     Rate and Rhythm: Normal rate and regular rhythm.     Pulses: Normal pulses.     Heart sounds: Normal heart sounds. No gallop.   Pulmonary:     Effort: Pulmonary effort is normal. No respiratory distress.     Breath sounds: Normal breath sounds. No wheezing.     Comments: Good air exch Chest:     Chest wall: No tenderness.  Abdominal:     General: Bowel sounds are normal. There is no distension or abdominal bruit.     Palpations: Abdomen is soft. There is no mass.     Tenderness: There is no abdominal tenderness.     Hernia: No hernia is present.  Genitourinary:    Comments: Breast exam: No mass, nodules, thickening, tenderness, bulging, retraction, inflamation, nipple discharge or skin changes noted.  No axillary or clavicular LA.     Baseline surgical changes with skin flap repair on R breast  Musculoskeletal:        General: No tenderness. Normal range of motion.     Cervical back: Normal range of motion and neck supple. No rigidity. No muscular tenderness.     Right lower leg: No edema.     Left lower leg: No edema.  Lymphadenopathy:     Cervical: No cervical adenopathy.  Skin:    General: Skin is warm and dry.     Coloration: Skin is not pale.     Findings: No erythema or rash.     Comments:  Solar lentigines diffusely Solar aging and some sks  Neurological:     Mental Status: She is alert. Mental status is at baseline.     Cranial Nerves: No cranial nerve deficit.     Motor: No abnormal muscle tone.     Coordination: Coordination normal.     Gait: Gait normal.     Deep Tendon Reflexes: Reflexes are normal and symmetric. Reflexes normal.  Psychiatric:        Mood and Affect: Mood normal.        Cognition and Memory: Cognition and memory normal.           Assessment & Plan:   Problem List Items Addressed This Visit      Nervous and Auditory   Hearing loss in left ear    Cholesteatoma  May require another procedure  Sees otolaryngology        Musculoskeletal and Integument   Osteoporosis    Due for dexa but declines it right now  Taking ca and D and walking  No falls or fx Past h/o tamoxifen  Enc to call when ready to schedule test  Vaginal itching    Now just external and under breasts  ? If intertrigo Trial of ketoconazole cream and update         Other   Hyperlipidemia    Disc goals for lipids and reasons to control them Rev last labs with pt Rev low sat fat diet in detail Intolerant of ever low intermittent dose of statin  Will work on diet and re check 2-3 mo      Relevant Orders   Lipid panel   BREAST CANCER, HX OF    Pending re check R mammogram for calcifications Interested in whether a genetic w/u could be done affordably  She would consider proph mastectomy if she could get it       Routine general medical examination at a health care facility    Reviewed health habits including diet and exercise and skin cancer prevention Reviewed appropriate screening tests for age  Also reviewed health mt list, fam hx and immunization status , as well as social and family history   See HPI Labs reviewed  Pending mammogram re check  Will check on coverage of shingrix  Declines dexa (no falls or fx)  Given materials to work on [employer]  directive No cognitive concerns  Baseline hearing loss-continues otol f/u for that  Stable vision , enc yearly eye care       Obesity    Discussed how this problem influences overall health and the risks it imposes  Reviewed plan for weight loss with lower calorie diet (via better food choices and also portion control or program like weight watchers) and exercise building up to or more than 30 minutes 5 days per week including some aerobic activity         Family history of melanoma    Due for derm visit for screening She plans to schedule it      Elevated glucose level    Lab Results  Component Value Date   HGBA1C 5.9 09/25/2020   disc imp of low glycemic diet and wt loss to prevent DM2       Medicare annual wellness visit, subsequent - Primary    Reviewed health habits including diet and exercise and skin cancer prevention Reviewed appropriate screening tests for age  Also reviewed health mt list, fam hx and immunization status , as well as social and family history   See HPI Labs reviewed  Pending mammogram re check  Will check on coverage of shingrix  Declines dexa (no falls or fx)  Given materials to work on [employer] directive No cognitive concerns  Baseline hearing loss-continues otol f/u for that  Stable vision , enc yearly eye care

## 2020-09-27 NOTE — Assessment & Plan Note (Signed)
Due for dexa but declines it right now  Taking ca and D and walking  No falls or fx Past h/o tamoxifen  Enc to call when ready to schedule test

## 2020-09-30 ENCOUNTER — Telehealth: Payer: Self-pay | Admitting: Family Medicine

## 2020-09-30 MED ORDER — OMEPRAZOLE 20 MG PO CPDR: 20.0000 mg | DELAYED_RELEASE_CAPSULE | Freq: Every day | ORAL | 1 refills | Status: DC

## 2020-09-30 NOTE — Telephone Encounter (Signed)
Pt called in she was given the wrong medication she is needing omerprazole

## 2020-10-02 ENCOUNTER — Ambulatory Visit
Admission: RE | Admit: 2020-10-02 | Discharge: 2020-10-02 | Disposition: A | Discharge status: Home / Self Care | Payer: PPO | Source: Ambulatory Visit | Attending: Family Medicine | Admitting: Family Medicine

## 2020-10-02 ENCOUNTER — Other Ambulatory Visit: Payer: Self-pay | Admitting: Family Medicine

## 2020-10-02 ENCOUNTER — Other Ambulatory Visit: Payer: Self-pay

## 2020-10-02 DIAGNOSIS — R922 Inconclusive mammogram: Secondary | ICD-10-CM | POA: Diagnosis not present

## 2020-10-02 DIAGNOSIS — R928 Other abnormal and inconclusive findings on diagnostic imaging of breast: Secondary | ICD-10-CM | POA: Diagnosis not present

## 2020-10-02 DIAGNOSIS — Z853 Personal history of malignant neoplasm of breast: Secondary | ICD-10-CM | POA: Diagnosis not present

## 2020-10-02 DIAGNOSIS — R921 Mammographic calcification found on diagnostic imaging of breast: Secondary | ICD-10-CM

## 2020-10-02 DIAGNOSIS — Z803 Family history of malignant neoplasm of breast: Secondary | ICD-10-CM | POA: Diagnosis not present

## 2020-10-06 IMAGING — DX DG KNEE COMPLETE 4+V*R*
4 series · 4 of 4 positions shown · non-contrast
Comparison: None.

CLINICAL DATA: 67-year-old female with right knee pain medially off
and on for year. Initial encounter.

EXAM:
RIGHT KNEE - COMPLETE 4+ VIEW

[knee ap]
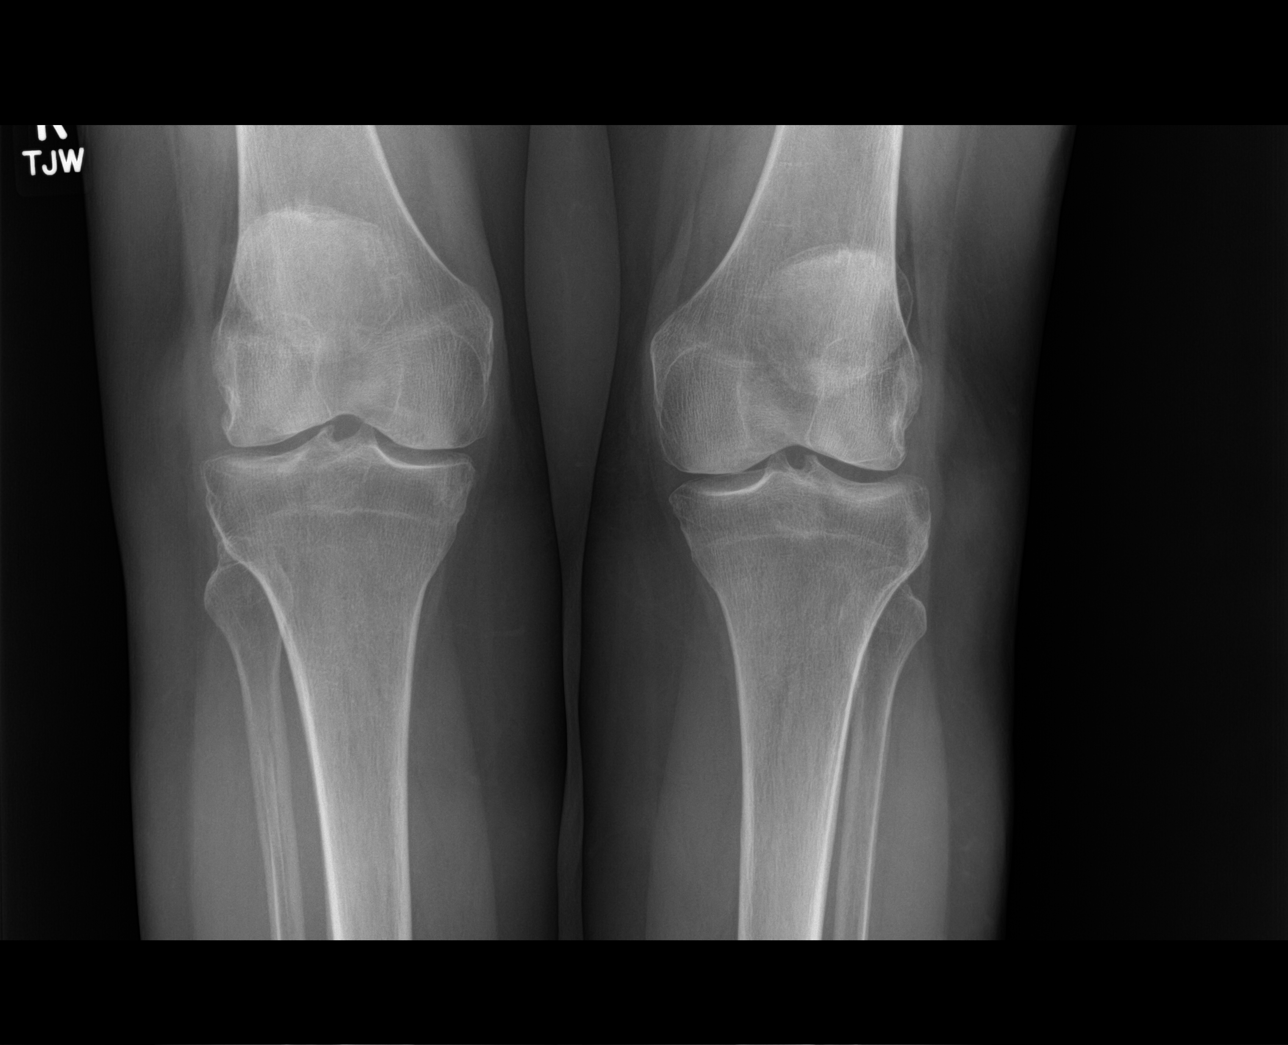

[knee lat]
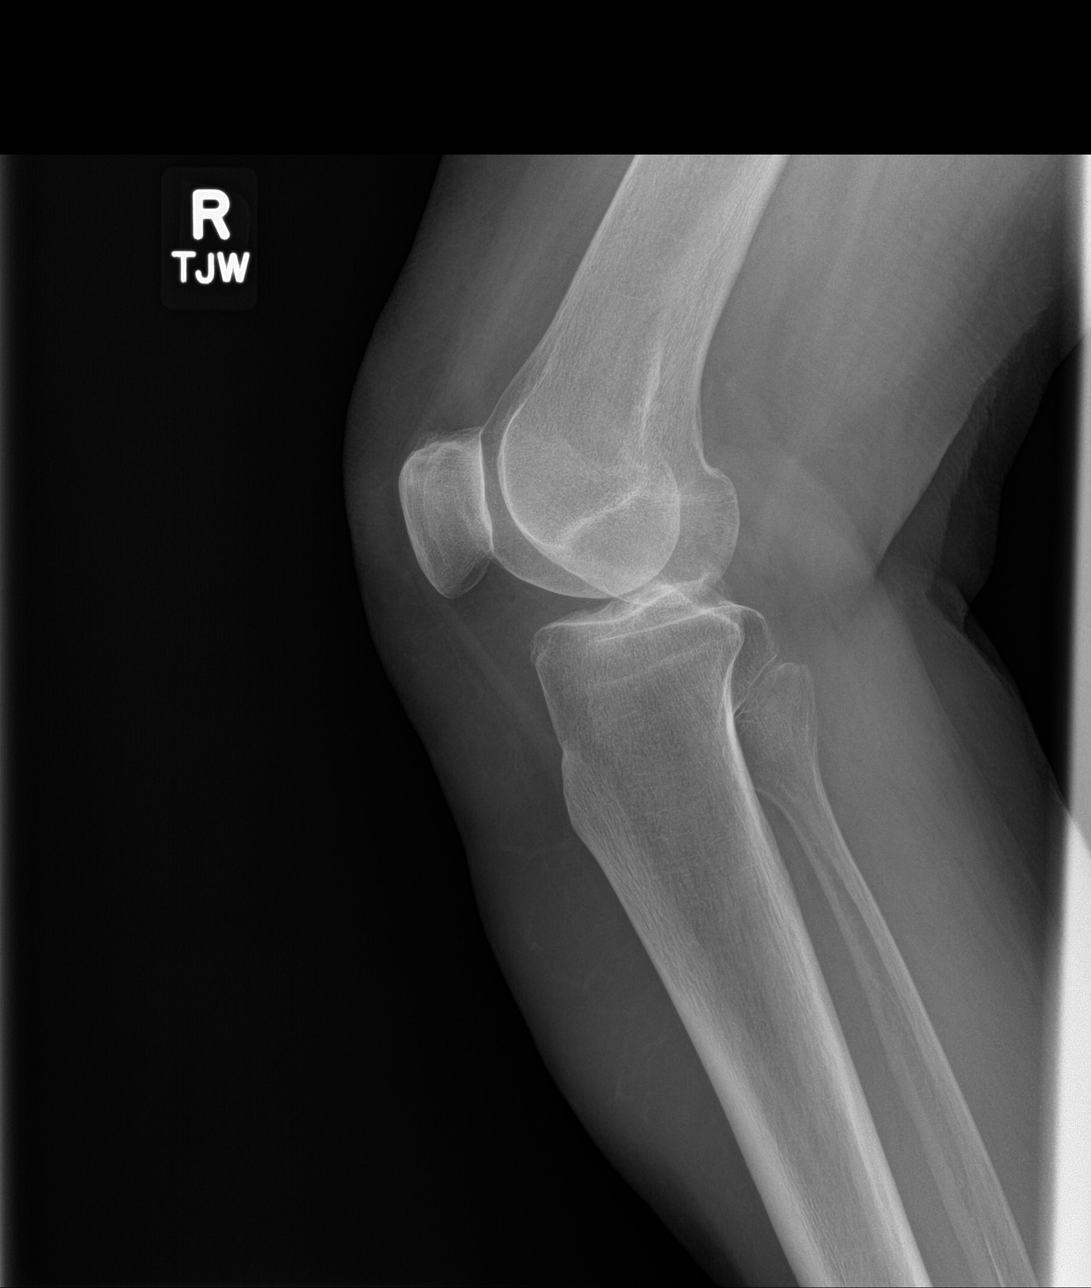

[patella skyline]
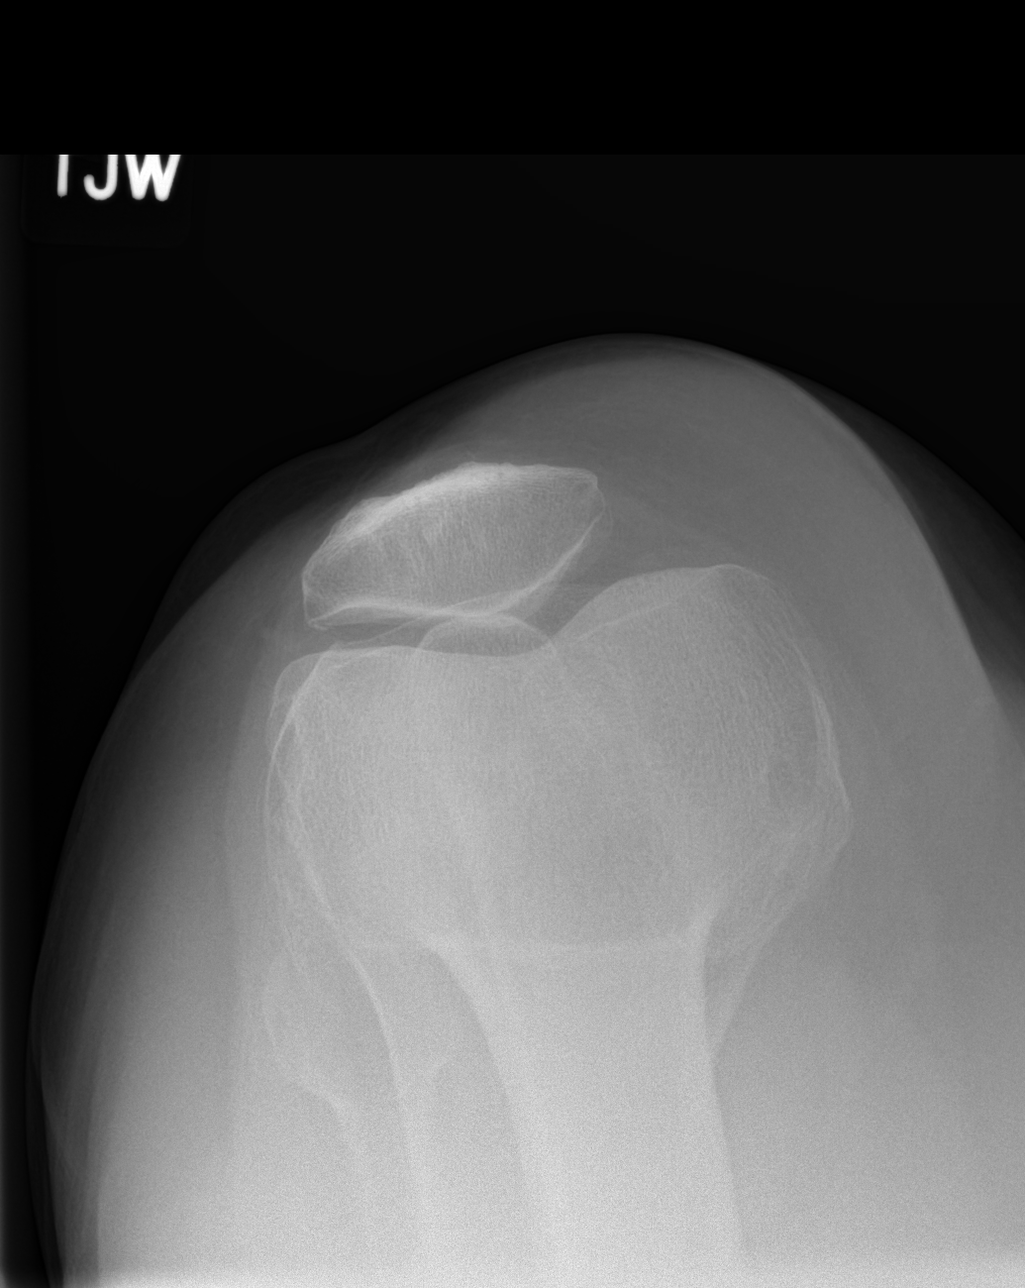

[knee obl]
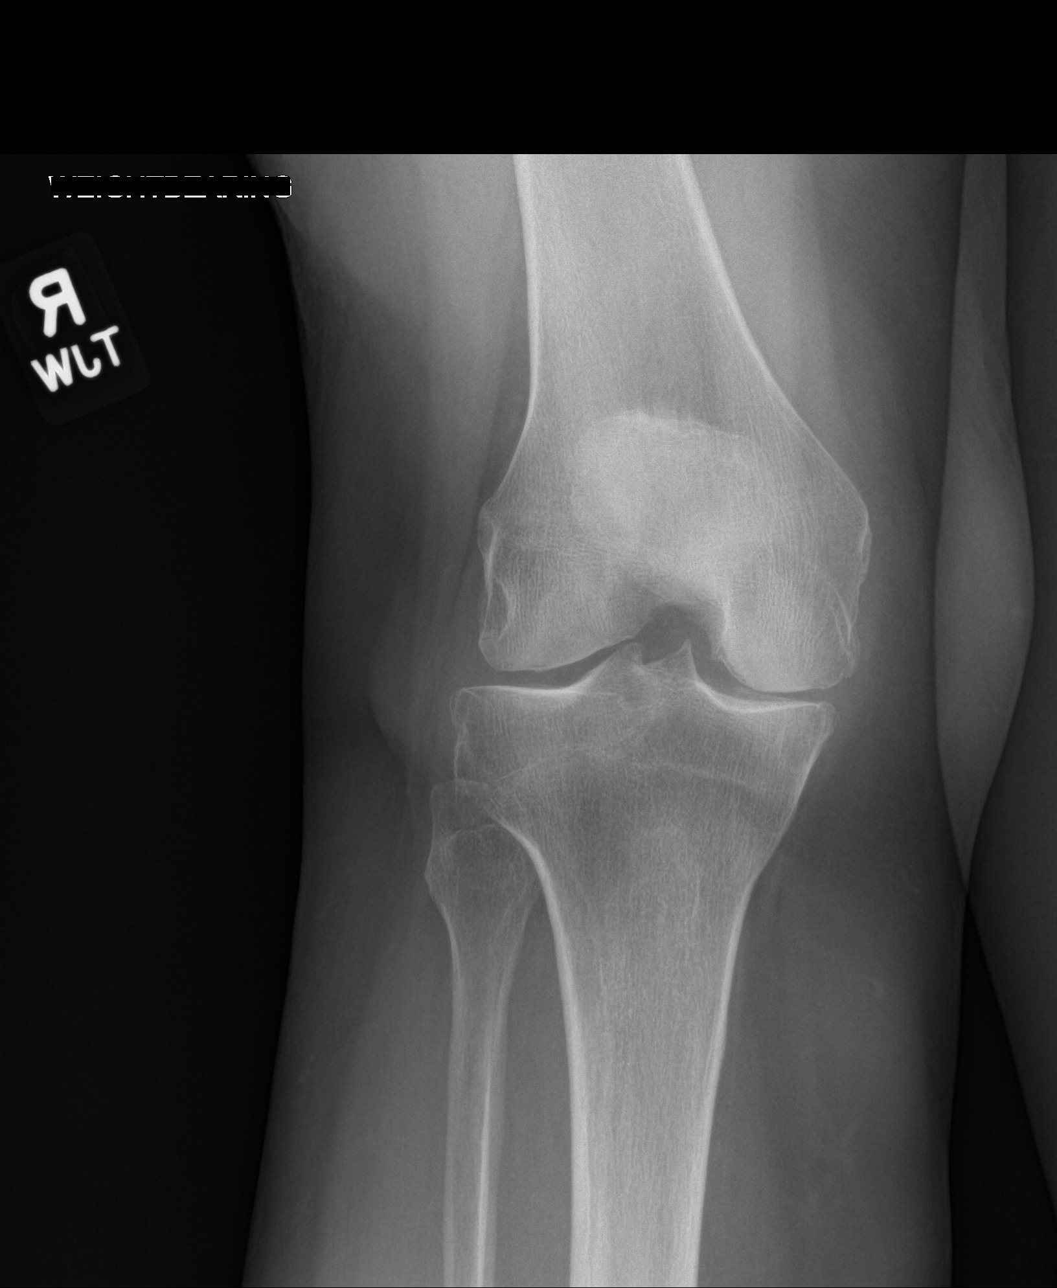

[4 of 4 positions shown; findings below may reference images not displayed]

FINDINGS: Mild to moderate medial tibiofemoral joint degenerative changes.

Minimal patellofemoral joint degenerative changes.

No fracture or dislocation. No joint effusion. Limited imaging left
knee.
IMPRESSION: 1. Mild to moderate right medial tibiofemoral joint degenerative
changes (best appreciated on Pirela view).
2. Minimal right patellofemoral joint degenerative changes.

## 2020-10-09 ENCOUNTER — Ambulatory Visit
Admission: RE | Admit: 2020-10-09 | Discharge: 2020-10-09 | Disposition: A | Discharge status: Home / Self Care | Payer: PPO | Source: Ambulatory Visit | Attending: Family Medicine | Admitting: Family Medicine

## 2020-10-09 ENCOUNTER — Other Ambulatory Visit: Payer: Self-pay

## 2020-10-09 DIAGNOSIS — R921 Mammographic calcification found on diagnostic imaging of breast: Secondary | ICD-10-CM

## 2020-10-09 DIAGNOSIS — D0511 Intraductal carcinoma in situ of right breast: Secondary | ICD-10-CM | POA: Diagnosis not present

## 2020-10-09 HISTORY — PX: BREAST BIOPSY: SHX20

## 2020-10-10 ENCOUNTER — Telehealth: Payer: Self-pay | Admitting: Hematology

## 2020-10-10 ENCOUNTER — Encounter: Payer: Self-pay | Admitting: *Deleted

## 2020-10-10 DIAGNOSIS — D0511 Intraductal carcinoma in situ of right breast: Secondary | ICD-10-CM | POA: Insufficient documentation

## 2020-10-10 NOTE — Telephone Encounter (Signed)
Spoke to patient to confirm morning [facility] appointment for 3/23, packet emailed to [employer]

## 2020-10-16 ENCOUNTER — Inpatient Hospital Stay (HOSPITAL_BASED_OUTPATIENT_CLINIC_OR_DEPARTMENT_OTHER): Payer: PPO | Admitting: Genetic Counselor

## 2020-10-16 ENCOUNTER — Inpatient Hospital Stay: Payer: PPO | Attending: Hematology | Admitting: Hematology

## 2020-10-16 ENCOUNTER — Encounter: Payer: Self-pay | Admitting: *Deleted

## 2020-10-16 ENCOUNTER — Inpatient Hospital Stay: Payer: PPO

## 2020-10-16 ENCOUNTER — Encounter: Payer: Self-pay | Admitting: Licensed Clinical Social Worker

## 2020-10-16 ENCOUNTER — Encounter: Payer: Self-pay | Admitting: Hematology

## 2020-10-16 ENCOUNTER — Ambulatory Visit: Payer: Self-pay | Admitting: General Surgery

## 2020-10-16 ENCOUNTER — Ambulatory Visit
Admission: RE | Admit: 2020-10-16 | Discharge: 2020-10-16 | Disposition: A | Discharge status: Home / Self Care | Payer: PPO | Source: Ambulatory Visit | Attending: Radiation Oncology | Admitting: Radiation Oncology

## 2020-10-16 ENCOUNTER — Ambulatory Visit: Payer: PPO | Admitting: Physical Therapy

## 2020-10-16 ENCOUNTER — Other Ambulatory Visit: Payer: Self-pay

## 2020-10-16 ENCOUNTER — Encounter: Payer: Self-pay | Admitting: Genetic Counselor

## 2020-10-16 VITALS — BP 159/88 | HR 125 | Temp 98.3°F | Resp 18 | Ht 66.0 in | Wt 190.0 lb

## 2020-10-16 DIAGNOSIS — M810 Age-related osteoporosis without current pathological fracture: Secondary | ICD-10-CM | POA: Diagnosis not present

## 2020-10-16 DIAGNOSIS — Z803 Family history of malignant neoplasm of breast: Secondary | ICD-10-CM | POA: Diagnosis not present

## 2020-10-16 DIAGNOSIS — D0511 Intraductal carcinoma in situ of right breast: Secondary | ICD-10-CM | POA: Insufficient documentation

## 2020-10-16 DIAGNOSIS — Z808 Family history of malignant neoplasm of other organs or systems: Secondary | ICD-10-CM | POA: Insufficient documentation

## 2020-10-16 DIAGNOSIS — M818 Other osteoporosis without current pathological fracture: Secondary | ICD-10-CM | POA: Diagnosis not present

## 2020-10-16 DIAGNOSIS — Z853 Personal history of malignant neoplasm of breast: Secondary | ICD-10-CM | POA: Diagnosis not present

## 2020-10-16 DIAGNOSIS — Z801 Family history of malignant neoplasm of trachea, bronchus and lung: Secondary | ICD-10-CM | POA: Insufficient documentation

## 2020-10-16 DIAGNOSIS — Z170 Estrogen receptor positive status [ER+]: Secondary | ICD-10-CM

## 2020-10-16 DIAGNOSIS — R739 Hyperglycemia, unspecified: Secondary | ICD-10-CM | POA: Insufficient documentation

## 2020-10-16 DIAGNOSIS — Z809 Family history of malignant neoplasm, unspecified: Secondary | ICD-10-CM | POA: Insufficient documentation

## 2020-10-16 LAB — CBC WITH DIFFERENTIAL (CANCER CENTER ONLY)
Abs Immature Granulocytes: 0.02 10*3/uL (ref 0.00–0.07)
Basophils Absolute: 0 10*3/uL (ref 0.0–0.1)
Basophils Relative: 0 %
Eosinophils Absolute: 0.2 10*3/uL (ref 0.0–0.5)
Eosinophils Relative: 3 %
HCT: 43.2 % (ref 36.0–46.0)
Hemoglobin: 14.3 g/dL (ref 12.0–15.0)
Immature Granulocytes: 0 %
Lymphocytes Relative: 30 %
Lymphs Abs: 2 10*3/uL (ref 0.7–4.0)
MCH: 29.1 pg (ref 26.0–34.0)
MCHC: 33.1 g/dL (ref 30.0–36.0)
MCV: 88 fL (ref 80.0–100.0)
Monocytes Absolute: 0.5 10*3/uL (ref 0.1–1.0)
Monocytes Relative: 8 %
Neutro Abs: 3.9 10*3/uL (ref 1.7–7.7)
Neutrophils Relative %: 59 %
Platelet Count: 246 10*3/uL (ref 150–400)
RBC: 4.91 MIL/uL (ref 3.87–5.11)
RDW: 12.6 % (ref 11.5–15.5)
WBC Count: 6.6 10*3/uL (ref 4.0–10.5)
nRBC: 0 % (ref 0.0–0.2)

## 2020-10-16 LAB — CMP (CANCER CENTER ONLY)
ALT: 16 U/L (ref 0–44)
AST: 14 U/L — ABNORMAL LOW (ref 15–41)
Albumin: 3.8 g/dL (ref 3.5–5.0)
Alkaline Phosphatase: 102 U/L (ref 38–126)
Anion gap: 10 (ref 5–15)
BUN: 14 mg/dL (ref 8–23)
CO2: 25 mmol/L (ref 22–32)
Calcium: 9.5 mg/dL (ref 8.9–10.3)
Chloride: 109 mmol/L (ref 98–111)
Creatinine: 0.91 mg/dL (ref 0.44–1.00)
GFR, Estimated: >60 mL/min (ref >60)
Glucose, Bld: 124 mg/dL — ABNORMAL HIGH (ref 70–99)
Potassium: 3.8 mmol/L (ref 3.5–5.1)
Sodium: 144 mmol/L (ref 135–145)
Total Bilirubin: 0.6 mg/dL (ref 0.3–1.2)
Total Protein: 6.9 g/dL (ref 6.5–8.1)

## 2020-10-16 LAB — GENETIC SCREENING ORDER

## 2020-10-16 NOTE — Progress Notes (Signed)
[facility]  Initial Assessment   Ashley Rasmussen is a 70 y.o. year old female accompanied by patient and husband Ashley Rasmussen) and daughter Ashley Rasmussen). Clinical Social Work was referred by [facility] for assessment of psychosocial needs.   SDOH (Social Determinants of Health) assessments performed: Yes SDOH Interventions   Flowsheet Row Most Recent Value  SDOH Interventions   Food Insecurity Interventions Intervention Not Indicated  Transportation Interventions Intervention Not Indicated      Distress Screen completed: Yes ONCBCN DISTRESS SCREENING 10/16/2020  Screening Type Initial Screening  Distress experienced in past week (1-10) 1      Family/Social Information:  . Housing Arrangement: patient lives with husband . Family members/support persons in your life? Family (husband, 4 daughters- 75 of whom live in [place]) . Transportation concerns: no  . Employment: Retired. Income source: [place] and husband's employment . Financial concerns: Potentially due to illness o Type of concern: No sepcific concern, more general . Food access concerns: no . Services Currently in place:  n/a  Coping/ Adjustment to diagnosis: . Patient understands treatment plan and what happens next? yes, has had breast cancer before, so feels calm and ready to get started . Concerns about diagnosis and/or treatment: I'm not especially worried about anything . Patient reported stressors: Finances (potentially) . Patient enjoys gardening and time with family/ friends . Current coping skills/ strengths: Capable of independent living, Motivation for treatment/growth and Supportive family/friends    SUMMARY: Current SDOH Barriers:  . Potential financial constraints depending on time husband needs to take off from work  Clinical Social Work Clinical Goal(s):  Marland Kitchen Patient will utilize resources provided for financial assistance as needed  Interventions: . Discussed common feeling  and emotions when being diagnosed with cancer, and the importance of support during treatment . Informed patient of the support team roles and support services at [facility] . Provided CSW contact information and encouraged patient to call with any questions or concerns . Provided patient with information about breast cancer foundations for financial assistance (mailed to patient)   Follow Up Plan: Patient will contact CSW with any support or resource needs or for assistance submitting applications Patient verbalizes understanding of plan: Yes    Christeen Douglas , LCSW

## 2020-10-16 NOTE — Progress Notes (Signed)
Radiation Oncology         [phone] ________________________________  Name: Ashley Rasmussen        MRN: [id number]  Date of Service: 10/16/2020 DOB: 1951-02-24  CC:Tower, Ashley Fanny, MD  Ashley Kussmaul, MD     REFERRING PHYSICIAN: [name], MD   DIAGNOSIS: The encounter diagnosis was Ductal carcinoma in situ (DCIS) of right breast.   HISTORY OF PRESENT ILLNESS: Ashley Rasmussen is a 70 y.o. female seen in the multidisciplinary breast clinic for a new diagnosis of right breast cancer.  The patient does have a history of right breast cancer for which she was treated with right lumpectomy and radiotherapy in 1992.  She did have a latissimus flap reconstruction and has a strong family history of breast cancer.  More recently however, the patient was noted to have a screening detected group of calcifications in the right breast.  Diagnostic work-up showed these in the upper outer quadrant measuring up to 6 mm, no ultrasound was required, and stereotactic biopsy on 10/09/2020 of the upper outer quadrant calcifications showed an intermediate to high-grade DCIS with calcifications and necrosis that was ER/PR positive.  She is seen today to discuss treatment recommendations of her cancer.    PREVIOUS RADIATION THERAPY:  1992: The right breast was treated following lumpectomy, details unfortunately are no longer available as to her dosing.   PAST MEDICAL HISTORY:  Past Medical History:  Diagnosis Date  . Allergic rhinitis, cause unspecified   . HLD (hyperlipidemia)   . Osteopenia   . Osteoporosis, unspecified   . Personal history of chemotherapy   . Personal history of malignant neoplasm of breast    needs yearly mammogram and MRI every 2  . Personal history of radiation therapy   . Thoracic or lumbosacral neuritis or radiculitis, unspecified        PAST SURGICAL HISTORY: Past Surgical History:  Procedure Laterality Date  . BREAST BIOPSY Left 03/2015  . Breast cancer excision     . BREAST LUMPECTOMY Right   . COLONOSCOPY WITH PROPOFOL N/A 04/11/2018   Procedure: COLONOSCOPY WITH PROPOFOL;  Surgeon: Jonathon Bellows, MD;  Location: [facility];  Service: Gastroenterology;  Laterality: N/A;  . LATISSIMUS FLAP TO BREAST     due to radiation burn     FAMILY HISTORY:  Family History  Problem Relation Age of Onset  . Other Mother        B-12 deficiency  . Breast cancer Mother   . Melanoma Mother   . Myelodysplastic syndrome Mother   . Osteoporosis Mother   . Other Sister        B-12 deficiency  . Breast cancer Sister   . Osteoporosis Sister   . Breast cancer Other        Multiple cousins     SOCIAL HISTORY:  reports that she has quit smoking. She has never used smokeless tobacco. She reports that she does not drink alcohol and does not use drugs.  The patient is married and resides in [place].  She is accompanied by her husband and daughter.   ALLERGIES: Crestor [rosuvastatin], Alendronate sodium, Nsaids, Pravachol, Raloxifene, and Simvastatin   MEDICATIONS:  Current Outpatient Medications  Medication Sig Dispense Refill  . acetaminophen (TYLENOL) 500 MG tablet Take 500 mg by mouth every 6 (six) hours as needed.    . cetirizine (ZYRTEC) 10 MG tablet Take 10 mg by mouth daily as needed.     . diclofenac sodium (VOLTAREN) 1 %  GEL Apply 2 g topically 4 (four) times daily as needed. 1 Tube 3  . famotidine (PEPCID) 40 MG tablet Take 1 tablet (40 mg total) by mouth daily. 90 tablet 3  . hydrocortisone (ANUSOL-HC) 2.5 % rectal cream Apply to external anal area for itch once daily as needed 30 g 0  . ketoconazole (NIZORAL) 2 % cream Apply 1 application topically 2 (two) times daily as needed for irritation. Topically to yeast rash 30 g 3  . metoprolol tartrate (LOPRESSOR) 25 MG tablet Take 1 tablet (25 mg total) by mouth 2 (two) times daily as needed (for fast heart rate or palpitations). 30 tablet 3  . Multiple Vitamin (MULTIVITAMIN) tablet Take 3 tablets by  mouth daily.    Marland Kitchen omeprazole (PRILOSEC) 20 MG capsule Take 1 capsule (20 mg total) by mouth daily. 90 capsule 1  . Phenazopyridine HCl (AZO TABS PO) Take by mouth daily as needed.      No current facility-administered medications for this encounter.     REVIEW OF SYSTEMS: On review of systems, the patient reports that she is doing well overall. She recalls difficulty with the effects of radiotherapy and healing that she had that ultimately led to her reconstruction.  She is hoping to avoid additional radiotherapy.    PHYSICAL EXAM:  Wt Readings from Last 3 Encounters:  09/27/20 188 lb 3 oz (85.4 kg)  03/05/20 189 lb 1 oz (85.8 kg)  06/27/19 193 lb 7 oz (87.7 kg)   Temp Readings from Last 3 Encounters:  09/27/20 97.9 F (36.6 C) (Temporal)  03/05/20 (!) 96.9 F (36.1 C) (Temporal)  07/14/19 97.8 F (36.6 C) (Oral)   BP Readings from Last 3 Encounters:  09/27/20 130/90  03/05/20 (!) 148/82  07/14/19 (!) 155/83   Pulse Readings from Last 3 Encounters:  09/27/20 (!) 101  03/05/20 90  07/14/19 85    In general this is a well appearing Caucasian female in no acute distress. She's alert and oriented x4 and appropriate throughout the examination. Cardiopulmonary assessment is negative for acute distress and she exhibits normal effort. Bilateral breast exam is deferred.    ECOG = 0  0 - Asymptomatic (Fully active, able to carry on all predisease activities without restriction)  1 - Symptomatic but completely ambulatory (Restricted in physically strenuous activity but ambulatory and able to carry out work of a light or sedentary nature. For example, light housework, office work)  2 - Symptomatic, <50% in bed during the day (Ambulatory and capable of all self care but unable to carry out any work activities. Up and about more than 50% of waking hours)  3 - Symptomatic, >50% in bed, but not bedbound (Capable of only limited self-care, confined to bed or chair 50% or more of waking  hours)  4 - Bedbound (Completely disabled. Cannot carry on any self-care. Totally confined to bed or chair)  5 - Death   Eustace Pen MM, Creech RH, Tormey DC, et al. [phone]). "Toxicity and response criteria of the [facility]". [place] Oncol. 5 (6): 649-55    LABORATORY DATA:  Lab Results  Component Value Date   WBC 6.1 09/25/2020   HGB 14.4 09/25/2020   HCT 43.1 09/25/2020   MCV 87.5 09/25/2020   PLT 247.0 09/25/2020   Lab Results  Component Value Date   NA 141 09/25/2020   K 4.1 09/25/2020   CL 107 09/25/2020   CO2 30 09/25/2020   Lab Results  Component Value Date  ALT 11 09/25/2020   AST 11 09/25/2020   ALKPHOS 101 09/25/2020   BILITOT 0.6 09/25/2020      RADIOGRAPHY: MM Digital Diagnostic Unilat R  Result Date: 10/02/2020 CLINICAL DATA:  70 year old female presenting as a recall from screening for possible right breast calcifications. Patient has a remote history of right breast cancer. Strong family history of breast cancer in the patient's mother and sister. EXAM: DIGITAL DIAGNOSTIC UNILATERAL RIGHT MAMMOGRAM TECHNIQUE: Right digital diagnostic mammography was performed. Mammographic images were processed with CAD. COMPARISON:  Previous exam(s). ACR Breast Density Category b: There are scattered areas of fibroglandular density. FINDINGS: Spot 2D magnification and full field 2D mL views of the right breast were performed. There is persistence of grouped faint pleomorphic calcifications with linear forms in the upper slightly outer right breast spanning up to 0.6 cm. No suspicious associated mass. No additional new findings elsewhere in the right breast. IMPRESSION: Indeterminate calcifications in the right breast spanning 0.6 cm. RECOMMENDATION: Stereotactic core needle biopsy of the right breast calcifications. I have discussed the findings and recommendations with the patient who agrees to proceed with biopsy. The patient will be scheduled for biopsy  prior to leaving the office today. BI-RADS CATEGORY  4: Suspicious. Electronically Signed   By: Audie Pinto M.D.   On: 10/02/2020 13:32   MM 3D SCREEN BREAST BILATERAL  Result Date: 09/19/2020 CLINICAL DATA:  Screening. Personal history of partial RIGHT mastectomy with latissimus dorsi flap reconstruction. EXAM: DIGITAL SCREENING BILATERAL MAMMOGRAM WITH TOMOSYNTHESIS AND CAD TECHNIQUE: Bilateral screening digital craniocaudal and mediolateral oblique mammograms were obtained. Bilateral screening digital breast tomosynthesis was performed. The images were evaluated with computer-aided detection. COMPARISON:  Previous exam(s). ACR Breast Density Category b: There are scattered areas of fibroglandular density. FINDINGS: In the right breast, calcifications warrant further evaluation with magnified views. In the left breast, no findings suspicious for malignancy. IMPRESSION: Further evaluation is suggested for calcifications in the right breast. RECOMMENDATION: Diagnostic mammogram of the right breast. (Code:[id number]) The patient will be contacted regarding the findings, and additional imaging will be scheduled. BI-RADS CATEGORY  0: Incomplete. Need additional imaging evaluation and/or prior mammograms for comparison. Electronically Signed   By: Evangeline Dakin M.D.   On: 09/19/2020 14:33   MM CLIP PLACEMENT RIGHT  Result Date: 10/09/2020 CLINICAL DATA:  Right breast calcifications. EXAM: 3D DIAGNOSTIC RIGHT MAMMOGRAM POST STEREOTACTIC BIOPSY COMPARISON:  Previous exam(s). FINDINGS: 3D Mammographic images were obtained following stereotactic guided biopsy of calcifications in the upper-outer quadrant of the right breast. The biopsy marking clip is approximately 5 mm medial to the biopsied calcifications. IMPRESSION: Coil shaped clip is approximately 5 mm medial to the biopsied calcifications in the upper-outer quadrant of the right breast. Final Assessment: Post Procedure Mammograms for Marker Placement  Electronically Signed   By: Lillia Mountain M.D.   On: 10/09/2020 11:27   MM RT BREAST BX W LOC DEV 1ST LESION IMAGE BX SPEC STEREO GUIDE  Addendum Date: 10/14/2020   ADDENDUM REPORT: 10/10/2020 11:56 ADDENDUM: Pathology revealed INTERMEDIATE to HIGH GRADE DUCTAL CARCINOMA IN SITU WITH NECROSIS AND CALCIFICATIONS of the Right breast, upper outer quadrant. This was found to be concordant by Dr. Lillia Mountain. Pathology results were discussed with the patient by telephone. The patient reported doing well after the biopsy with tenderness at the site. Post biopsy instructions and care were reviewed and questions were answered. The patient was encouraged to call [facility] for any additional concerns. My direct phone number  was provided. The patient was referred to [facility] on October 16, 2020. Pathology results reported by Terie Purser, RN on 10/10/2020. Electronically Signed   By: Lillia Mountain M.D.   On: 10/10/2020 11:56   Result Date: 10/14/2020 CLINICAL DATA:  Right breast calcifications. Patient has a remote history of right breast cancer. EXAM: RIGHT BREAST STEREOTACTIC CORE NEEDLE BIOPSY COMPARISON:  Previous exams. FINDINGS: The patient and I discussed the procedure of stereotactic-guided biopsy including benefits and alternatives. We discussed the high likelihood of a successful procedure. We discussed the risks of the procedure including infection, bleeding, tissue injury, clip migration, and inadequate sampling. Informed written consent was given. The usual time out protocol was performed immediately prior to the procedure. Using sterile technique and 1% lidocaine and 1% lidocaine with epinephrine as local anesthetic, under stereotactic guidance, a 9 gauge vacuum assisted device was used to perform core needle biopsy of calcifications in the upper-outer quadrant of the right breast using a lateral to medial  approach. Specimen radiograph was performed showing calcifications are present in the tissue samples. Specimens with calcifications are identified for pathology. Lesion quadrant: Upper-outer quadrant At the conclusion of the procedure, coil shaped tissue marker clip was deployed into the biopsy cavity. Follow-up 2-view mammogram was performed and dictated separately. IMPRESSION: Stereotactic-guided biopsy of the right breast. No apparent complications. Electronically Signed: By: Lillia Mountain M.D. On: 10/09/2020 11:12       IMPRESSION/PLAN: 1. ER/PR positive intermediate to high-grade DCIS of the right breast in the setting of prior right breast carcinoma in 1992. Dr. Lisbeth Renshaw discusses the pathology findings and reviews the nature of noninvasive breast disease and a similar site of prior disease. The consensus from the breast conference includes consideration of mastectomy versus generous surgical resection with breast conservation.  Dr. Lisbeth Renshaw discusses that in patients who have received prior radiotherapy to the breast we would not generally offer reirradiation unless there were high risk features based on final pathology results.  Provided that margins were clear if she has breast conservation or if she undergoes mastectomy, there would not be an anticipated role for radiotherapy.  We also reviewed that medical oncology has also recommended adjuvant antiestrogen therapy.  We will follow-up with the final results of her surgery and would be happy to meet back with her if there were a high risk feature to include a role for radiotherapy however at this time she is pleased to avoid treatment based on her work-up thus far.  In a visit lasting 45 minutes, greater than 50% of the time was spent face to face reviewing her case, as well as in preparation of, discussing, and coordinating the patient's care.  The above documentation reflects my direct findings during this shared patient visit. Please see the separate  note by Dr. Lisbeth Renshaw on this date for the remainder of the patient's plan of care.    Carola Rhine, [facility]    **Disclaimer: This note was dictated with voice recognition software. Similar sounding words can inadvertently be transcribed and this note may contain transcription errors which may not have been corrected upon publication of note.**

## 2020-10-16 NOTE — Progress Notes (Signed)
REFERRING PROVIDER: Truitt Merle, MD [place],  [place] [address]  PRIMARY PROVIDER:  Abner Greenspan, MD  PRIMARY REASON FOR VISIT:  1. BREAST CANCER, HX OF   2. Ductal carcinoma in situ (DCIS) of right breast   3. Family history of melanoma   4. Family history of breast cancer   5. Family history of lung cancer      HISTORY OF PRESENT ILLNESS:   Ashley Rasmussen, a 70 y.o. female, was seen for a [facility] cancer genetics consultation at the request of Dr. Burr Medico due to a personal and family history of cancer.  Ashley Rasmussen presents to clinic today to discuss the possibility of a hereditary predisposition to cancer, genetic testing, and to further clarify her future cancer risks, as well as potential cancer risks for family members.   Ashley Rasmussen has a history of right breast cancer diagnosed at age 62. This cancer was treated with lumpectomy, chemotherapy, radiation, and antiestrogen therapy. More recently, in March of 2022 at the age of 61, Ashley Rasmussen was diagnosed with ductal carcinoma in situ of the right breast. The tumor is ER+ and PR+. The treatment plan includes surgery and antiestrogen therapy.   CANCER HISTORY:  Oncology History Overview Note  Cancer Staging Ductal carcinoma in situ (DCIS) of right breast Staging form: Breast, AJCC 8th Edition - Clinical stage from 10/09/2020: Stage 0 (cTis (DCIS), cN0, cM0, G3, ER+, PR+, HER2-) - Signed by Truitt Merle, MD on 10/15/2020 Stage prefix: Initial diagnosis Histologic grading system: 3 grade system    Ductal carcinoma in situ (DCIS) of right breast  10/02/2020 Mammogram   IMPRESSION: Indeterminate calcifications in the right breast spanning 0.6 cm in the upper slightly outer right breast   10/09/2020 Cancer Staging   Staging form: Breast, AJCC 8th Edition - Clinical stage from 10/09/2020: Stage 0 (cTis (DCIS), cN0, cM0, G3, ER+, PR+, HER2-) - Signed by Truitt Merle, MD on 10/15/2020 Stage prefix: Initial diagnosis Histologic  grading system: 3 grade system   10/09/2020 Initial Biopsy   Diagnosis Breast, right, needle core biopsy, right breast, uoq - DUCTAL CARCINOMA IN SITU WITH NECROSIS AND CALCIFICATIONS. - SEE MICROSCOPIC DESCRIPTION. Microscopic Comment The DCIS has intermediate to high nuclear grade. Estrogen and progesterone receptors will be performed.    Results: IMMUNOHISTOCHEMICAL AND MORPHOMETRIC ANALYSIS PERFORMED MANUALLY Estrogen Receptor: 95%, POSITIVE, STRONG STAINING INTENSITY Progesterone Receptor: 40%, POSITIVE, STRONG STAINING INTENSITY   10/10/2020 Initial Diagnosis   Ductal carcinoma in situ (DCIS) of right breast     RISK FACTORS:  Menarche was at age 48.  First live birth at age 40.  OCP use for approximately 0 years.  Menopausal status: postmenopausal.  HRT use: 0 years. Mammogram within the last year: yes.   Past Medical History:  Diagnosis Date  . Allergic rhinitis, cause unspecified   . Breast cancer ([place]) 1992  . Family history of breast cancer   . Family history of lung cancer   . HLD (hyperlipidemia)   . Osteopenia   . Osteoporosis, unspecified   . Personal history of chemotherapy   . Personal history of malignant neoplasm of breast    needs yearly mammogram and MRI every 2  . Personal history of radiation therapy   . Thoracic or lumbosacral neuritis or radiculitis, unspecified     Past Surgical History:  Procedure Laterality Date  . BREAST BIOPSY Left 03/2015  . Breast cancer excision    . BREAST LUMPECTOMY Right   . COLONOSCOPY WITH  PROPOFOL N/A 04/11/2018   Procedure: COLONOSCOPY WITH PROPOFOL;  Surgeon: Jonathon Bellows, MD;  Location[facility] ENDOSCOPY;  Service: Gastroenterology;  Laterality: N/A;  . LATISSIMUS FLAP TO BREAST     due to radiation burn    Social History   Socioeconomic History  . Marital status: Married    Spouse name: Not on file  . Number of children: 4  . Years of education: Not on file  . Highest education level: Not on file   Occupational History  . Not on file  Tobacco Use  . Smoking status: Former Smoker    Years: 2.00    Quit date: 07/27/1989    Years since quitting: 31.2  . Smokeless tobacco: Never Used  . Tobacco comment: quit over 34yr ago  Vaping Use  . Vaping Use: Never used  Substance and Sexual Activity  . Alcohol use: No    Alcohol/week: 0.0 standard drinks  . Drug use: No  . Sexual activity: Never  Other Topics Concern  . Not on file  Social History Narrative   Married      4 children         Social Determinants of Health   Financial Resource Strain: Not on file  Food Insecurity: No Food Insecurity  . Worried About RCharity fundraiserin the Last Year: Never true  . Ran Out of Food in the Last Year: Never true  Transportation Needs: No Transportation Needs  . Lack of Transportation (Medical): No  . Lack of Transportation (Non-Medical): No  Physical Activity: Not on file  Stress: Not on file  Social Connections: Not on file     FAMILY HISTORY:  We obtained a detailed, 4-generation family history.  Significant diagnoses are listed below: Family History  Problem Relation Age of Onset  . Other Mother        B-12 deficiency  . Breast cancer Mother 699 . Melanoma Mother        dx 235s(foot) and 640s(arm)  . Myelodysplastic syndrome Mother        dx mid-70s  . Osteoporosis Mother   . Other Sister        B-12 deficiency  . Breast cancer Sister 342      "inconclusive" genetic testing  . Osteoporosis Sister   . Alcoholism Father   . Lung cancer Maternal Uncle        radiation exposure   . Cancer Nephew 23       Ewing's sarcoma  . Breast cancer Cousin 378      (maternal first cousin)  . Breast cancer Cousin        dx 50s/60s (maternal first cousin)  . Cancer Paternal Aunt        unknown type, dx >50  . Cancer Paternal Aunt        unknown type, dx >50   Ashley Rasmussen four daughters (ages 374-50. One daughter (Ashley Rasmussen had negative BRCA1 and BRCA2 testing. She has one  brother (age 58972 and two sisters (ages 683and 61. Her oldest sister has a history of breast cancer diagnosed at age 58363and had genetic testing that was inconclusive (report not available for review). This sister's son died from EEustisat age 58252(diagnosed age 58253.  Ms. JLunsfordmother died at age 58218and had a history of breast cancer (diagnosed at age 58974, melanoma (diagnosed on her foot in her 266s and on her arm in her 682s, and a  blood disorder. There were nine maternal aunts and four maternal uncles. One uncle had lung cancer and a history of radiation exposure during the cleanup of a nuclear bomb in [place]. Two maternal cousins had breast cancer - one diagnosed in her late 41s, and one diagnosed in her late 45s or early 75s. Ashley Rasmussen maternal grandmother died at age 84 without cancer. Her maternal grandfather died in his 38s without cancer.   Ashley Rasmussen father died at age 34 without cancer. There were eight paternal aunts/uncles (unsure how many aunts vs uncles). Two aunts had cancer diagnosed older than 55 (Ms. Marut does not know the type of cancer). There is no known cancer among paternal cousins. Ashley Rasmussen paternal grandparents died older than 1 without cancer.   Ashley Rasmussen is aware of previous family history of genetic testing for hereditary cancer risks. Patient's maternal and paternal ancestors are of English/British descent. There is no reported Ashkenazi Jewish ancestry. There is no known consanguinity.  GENETIC COUNSELING ASSESSMENT: Ashley Rasmussen is a 70 y.o. female with a personal and family history of young-onset breast cancer, as well as a family history of melanoma, which is somewhat suggestive of a hereditary cancer syndrome and predisposition to cancer. We, therefore, discussed and recommended the following at today's visit.   DISCUSSION: We discussed that approximately 5-10% of breast cancer is hereditary, with most cases associated with the BRCA1 and BRCA2 genes. There are  other genes that can be associated with hereditary breast cancer syndromes. These include ATM, CHEK2, PALB2, etc. We discussed that testing is beneficial for several reasons, including knowing about other cancer risks, identifying potential screening and risk-reduction options that may be appropriate, and to understand if other family members could be at risk for cancer and allow them to undergo genetic testing.  We reviewed the characteristics, features and inheritance patterns of hereditary cancer syndromes. We also discussed genetic testing, including the appropriate family members to test, the process of testing, insurance coverage and turn-around-time for results. We discussed the implications of a negative, positive and/or variant of uncertain significant result. In order to get genetic test results in a timely manner so that Ashley Rasmussen can use these genetic test results for surgical decisions, we recommended Ashley Rasmussen pursue genetic testing for the [facility] panel. Once complete, we recommend Ashley Rasmussen pursue reflex genetic testing to the CancerNext-Expanded + RNAinsight panel.   The BRCAplus panel offered by [employer] and includes sequencing and deletion/duplication analysis for the following 8 genes: ATM, BRCA1, BRCA2, CDH1, CHEK2, PALB2, PTEN, and TP53. The CancerNext-Expanded + RNAinsight gene panel offered by [employer] and includes sequencing and rearrangement analysis for the following 77 genes: AIP, ALK, APC, ATM, AXIN2, BAP1, BARD1, BLM, BMPR1A, BRCA1, BRCA2, BRIP1, CDC73, CDH1, CDK4, CDKN1B, CDKN2A, CHEK2, CTNNA1, DICER1, FANCC, FH, FLCN, GALNT12, KIF1B, LZTR1, MAX, MEN1, MET, MLH1, MSH2, MSH3, MSH6, MUTYH, NBN, NF1, NF2, NTHL1, PALB2, PHOX2B, PMS2, POT1, PRKAR1A, PTCH1, PTEN, RAD51C, RAD51D, RB1, RECQL, RET, SDHA, SDHAF2, SDHB, SDHC, SDHD, SMAD4, SMARCA4, SMARCB1, SMARCE1, STK11, SUFU, TMEM127, TP53, TSC1, TSC2, VHL and XRCC2 (sequencing and deletion/duplication); EGFR, EGLN1,  HOXB13, KIT, MITF, PDGFRA, POLD1 and POLE (sequencing only); EPCAM and GREM1 (deletion/duplication only). RNA data is routinely analyzed for use in variant interpretation for all genes.  Based on Ashley Rasmussen personal and family history of cancer, she meets medical criteria for genetic testing. Despite that she meets criteria, she may still have an out of pocket cost. Per the billing policy of [employer], her  bill should not be more than $100 since she has breast cancer.  PLAN: After considering the risks, benefits, and limitations, Ashley Rasmussen provided informed consent to pursue genetic testing and the blood sample was sent to [facility] for analysis of the BRCAplus and CancerNext-Expanded + RNAinsight panels. Results should be available within approximately one-two weeks' time, at which point they will be disclosed by telephone to Ashley Rasmussen, as will any additional recommendations warranted by these results. Ms. Alkhatib will receive a summary of her genetic counseling visit and a copy of her results once available. This information will also be available in Epic.   Ms. Ransome questions were answered to her satisfaction today. Our contact information was provided should additional questions or concerns arise. Thank you for the referral and allowing [place] to share in the care of your patient.   Clint Guy, [place], [facility] Licensed, Certified Dispensing [email or web address] Phone: [phone]  The patient was seen for a total of 30 minutes in face-to-face genetic counseling.  This patient was discussed with Drs. Magrinat, Lindi Adie and/or Burr Medico who agrees with the above.    _______________________________________________________________________ For Office Staff:  Number of people involved in session: 1 Was an Intern/ student involved with case: no

## 2020-10-16 NOTE — Progress Notes (Signed)
[name]   [phone] Fax:([phone]) [name] Note   Patient Care Team: Tower, [name], MD as PCP - [name], Paulette Blanch, RN as Oncology Nurse Navigator Rockwell Germany, RN as Oncology Nurse Navigator [name], MD as Consulting Physician (General Surgery) Truitt Merle, MD as Consulting Physician (Hematology) Kyung Rudd, MD as Consulting Physician (Radiation Oncology)  Date of Service:  10/16/2020   CHIEF COMPLAINTS/PURPOSE OF CONSULTATION:  Ductal carcinoma in situ (DCIS) of right breast   Oncology History Overview Note  Cancer Staging Ductal carcinoma in situ (DCIS) of right breast Staging form: Breast, AJCC 8th Edition - Clinical stage from 10/09/2020: Stage 0 (cTis (DCIS), cN0, cM0, G3, ER+, PR+, HER2-) - Signed by Truitt Merle, MD on 10/15/2020 Stage prefix: Initial diagnosis Histologic grading system: 3 grade system    Ductal carcinoma in situ (DCIS) of right breast  10/02/2020 Mammogram   IMPRESSION: Indeterminate calcifications in the right breast spanning 0.6 cm in the upper slightly outer right breast   10/09/2020 Cancer Staging   Staging form: Breast, AJCC 8th Edition - Clinical stage from 10/09/2020: Stage 0 (cTis (DCIS), cN0, cM0, G3, ER+, PR+, HER2-) - Signed by Truitt Merle, MD on 10/15/2020 Stage prefix: Initial diagnosis Histologic grading system: 3 grade system   10/09/2020 Initial Biopsy   Diagnosis Breast, right, needle core biopsy, right breast, uoq - DUCTAL CARCINOMA IN SITU WITH NECROSIS AND CALCIFICATIONS. - SEE MICROSCOPIC DESCRIPTION. Microscopic Comment The DCIS has intermediate to high nuclear grade. Estrogen and progesterone receptors will be performed.    Results: IMMUNOHISTOCHEMICAL AND MORPHOMETRIC ANALYSIS PERFORMED MANUALLY Estrogen Receptor: 95%, POSITIVE, STRONG STAINING INTENSITY Progesterone Receptor: 40%, POSITIVE, STRONG STAINING INTENSITY   10/10/2020 Initial Diagnosis   Ductal  carcinoma in situ (DCIS) of right breast      HISTORY OF PRESENTING ILLNESS:  Ashley Rasmussen 70 y.o. female is a here because of DCIS of right breast. The patient presents to the breast clinic today accompanied by her daughter and husband.   Her breast mass was found on mammogram. She denies ant recent breast or nipple changes and overall feel healthy and at baseline. She was diagnosed with right breast cancer, ER negative in 1992. She was treated in [place] and [place] with lumpectomy, 6 months of chemo, radiation and 1 year of Tamoxifen.   Socially she is married with 4 adult children. She used to smoke for 2 years in the 80-90s. She does not drink. She has a PMHx of HLD. She is on Pepcid and vitamins. I reviewed her medication list with her. Her mother and sister had breast cancer and her sister also had melanoma.     GYN HISTORY  Menarchal: 10 LMP: 58 Contraceptive:No HRT: No G4P: first at 40    REVIEW OF SYSTEMS:    Constitutional: Denies fevers, chills or abnormal night sweats Eyes: Denies blurriness of vision, double vision or watery eyes Ears, nose, mouth, throat, and face: Denies mucositis or sore throat Respiratory: Denies cough, dyspnea or wheezes Cardiovascular: Denies palpitation, chest discomfort or lower extremity swelling Gastrointestinal:  Denies nausea, heartburn or change in bowel habits Skin: Denies abnormal skin rashes Lymphatics: Denies new lymphadenopathy or easy bruising Neurological:Denies numbness, tingling or new weaknesses Behavioral/Psych: Mood is stable, no new changes  All other systems were reviewed with the patient and are negative.   MEDICAL HISTORY:  Past Medical History:  Diagnosis Date  . Allergic rhinitis, cause unspecified   . Breast  cancer ([place]) 1992  . Family history of breast cancer   . Family history of lung cancer   . HLD (hyperlipidemia)   . Osteopenia   . Osteoporosis, unspecified   . Personal history of chemotherapy   .  Personal history of malignant neoplasm of breast    needs yearly mammogram and MRI every 2  . Personal history of radiation therapy   . Thoracic or lumbosacral neuritis or radiculitis, unspecified     SURGICAL HISTORY: Past Surgical History:  Procedure Laterality Date  . BREAST BIOPSY Left 03/2015  . Breast cancer excision    . BREAST LUMPECTOMY Right   . COLONOSCOPY WITH PROPOFOL N/A 04/11/2018   Procedure: COLONOSCOPY WITH PROPOFOL;  Surgeon: Jonathon Bellows, MD;  Location: [facility] ENDOSCOPY;  Service: Gastroenterology;  Laterality: N/A;  . LATISSIMUS FLAP TO BREAST     due to radiation burn    SOCIAL HISTORY: Social History   Socioeconomic History  . Marital status: Married    Spouse name: Not on file  . Number of children: 4  . Years of education: Not on file  . Highest education level: Not on file  Occupational History  . Not on file  Tobacco Use  . Smoking status: Former Smoker    Years: 2.00    Quit date: 07/27/1989    Years since quitting: 31.2  . Smokeless tobacco: Never Used  . Tobacco comment: quit over 82yr ago  Vaping Use  . Vaping Use: Never used  Substance and Sexual Activity  . Alcohol use: No    Alcohol/week: 0.0 standard drinks  . Drug use: No  . Sexual activity: Never  Other Topics Concern  . Not on file  Social History Narrative   Married      4 children         Social Determinants of Health   Financial Resource Strain: Not on file  Food Insecurity: No Food Insecurity  . Worried About RCharity fundraiserin the Last Year: Never true  . Ran Out of Food in the Last Year: Never true  Transportation Needs: No Transportation Needs  . Lack of Transportation (Medical): No  . Lack of Transportation (Non-Medical): No  Physical Activity: Not on file  Stress: Not on file  Social Connections: Not on file  Intimate Partner Violence: Not on file    FAMILY HISTORY: Family History  Problem Relation Age of Onset  . Other Mother        B-12 deficiency   . Breast cancer Mother 670 . Melanoma Mother        dx 250s(foot) and 639s(arm)  . Myelodysplastic syndrome Mother        dx mid-70s  . Osteoporosis Mother   . Other Sister        B-12 deficiency  . Breast cancer Sister 381      "inconclusive" genetic testing  . Osteoporosis Sister   . Alcoholism Father   . Lung cancer Maternal Uncle        radiation exposure   . Cancer Nephew 23       Ewing's sarcoma  . Breast cancer Cousin 325      (maternal first cousin)  . Breast cancer Cousin        dx 50s/60s (maternal first cousin)  . Cancer Paternal Aunt        unknown type, dx >50  . Cancer Paternal Aunt        unknown type, dx >  51    ALLERGIES:  is allergic to crestor [rosuvastatin], alendronate sodium, nsaids, pravachol, raloxifene, and simvastatin.  MEDICATIONS:  Current Outpatient Medications  Medication Sig Dispense Refill  . omeprazole (PRILOSEC) 20 MG capsule Take 1 capsule (20 mg total) by mouth daily. 90 capsule 1  . acetaminophen (TYLENOL) 500 MG tablet Take 500 mg by mouth every 6 (six) hours as needed.    . Multiple Vitamin (MULTIVITAMIN) tablet Take 3 tablets by mouth daily.     No current facility-administered medications for this visit.    PHYSICAL EXAMINATION: ECOG PERFORMANCE STATUS: 0 - Asymptomatic  Vitals:   10/16/20 0854  BP: (!) 159/88  Pulse: (!) 125  Resp: 18  Temp: 98.3 F (36.8 C)  SpO2: 99%   Filed Weights   10/16/20 0854  Weight: 190 lb (86.2 kg)    GENERAL:alert, no distress and comfortable SKIN: skin color, texture, turgor are normal, no rashes or significant lesions EYES: normal, Conjunctiva are pink and non-injected, sclera clear  NECK: supple, thyroid normal size, non-tender, without nodularity LYMPH:  no palpable lymphadenopathy in the cervical, axillary  LUNGS: clear to auscultation and percussion with normal breathing effort HEART: regular rate & rhythm and no murmurs and no lower extremity edema ABDOMEN:abdomen soft,  non-tender and normal bowel sounds Musculoskeletal:no cyanosis of digits and no clubbing  NEURO: alert & oriented x 3 with fluent speech, no focal motor/sensory deficits BREAST: S/p right lumpectomy (+) palpable 1x2cm of lateral right breast at biopsy site. Left Breast exam benign, left breast is smaller than right.   LABORATORY DATA:  I have reviewed the data as listed CBC Latest Ref Rng & Units 10/16/2020 09/25/2020 06/19/2019  WBC 4.0 - 10.5 K/uL 6.6 6.1 6.8  Hemoglobin 12.0 - 15.0 g/dL 14.3 14.4 14.0  Hematocrit 36.0 - 46.0 % 43.2 43.1 42.4  Platelets 150 - 400 K/uL 246 247.0 255.0    CMP Latest Ref Rng & Units 10/16/2020 09/25/2020 06/19/2019  Glucose 70 - 99 mg/dL 124(H) 109(H) 110(H)  BUN 8 - 23 mg/dL '14 15 19  ' Creatinine 0.44 - 1.00 mg/dL 0.91 0.81 0.88  Sodium 135 - 145 mmol/L 144 141 141  Potassium 3.5 - 5.1 mmol/L 3.8 4.1 4.4  Chloride 98 - 111 mmol/L 109 107 106  CO2 22 - 32 mmol/L '25 30 27  ' Calcium 8.9 - 10.3 mg/dL 9.5 9.8 9.6  Total Protein 6.5 - 8.1 g/dL 6.9 6.3 6.3  Total Bilirubin 0.3 - 1.2 mg/dL 0.6 0.6 0.5  Alkaline Phos 38 - 126 U/L 102 101 106  AST 15 - 41 U/L 14(L) 11 13  ALT 0 - 44 U/L '16 11 12     ' RADIOGRAPHIC STUDIES: I have personally reviewed the radiological images as listed and agreed with the findings in the report. MM Digital Diagnostic Unilat R  Result Date: 10/02/2020 CLINICAL DATA:  70 year old female presenting as a recall from screening for possible right breast calcifications. Patient has a remote history of right breast cancer. Strong family history of breast cancer in the patient's mother and sister. EXAM: DIGITAL DIAGNOSTIC UNILATERAL RIGHT MAMMOGRAM TECHNIQUE: Right digital diagnostic mammography was performed. Mammographic images were processed with CAD. COMPARISON:  Previous exam(s). ACR Breast Density Category b: There are scattered areas of fibroglandular density. FINDINGS: Spot 2D magnification and full field 2D mL views of the right breast  were performed. There is persistence of grouped faint pleomorphic calcifications with linear forms in the upper slightly outer right breast spanning up to 0.6 cm. No  suspicious associated mass. No additional new findings elsewhere in the right breast. IMPRESSION: Indeterminate calcifications in the right breast spanning 0.6 cm. RECOMMENDATION: Stereotactic core needle biopsy of the right breast calcifications. I have discussed the findings and recommendations with the patient who agrees to proceed with biopsy. The patient will be scheduled for biopsy prior to leaving the office today. BI-RADS CATEGORY  4: Suspicious. Electronically Signed   By: Audie Pinto M.D.   On: 10/02/2020 13:32   MM CLIP PLACEMENT RIGHT  Result Date: 10/09/2020 CLINICAL DATA:  Right breast calcifications. EXAM: 3D DIAGNOSTIC RIGHT MAMMOGRAM POST STEREOTACTIC BIOPSY COMPARISON:  Previous exam(s). FINDINGS: 3D Mammographic images were obtained following stereotactic guided biopsy of calcifications in the upper-outer quadrant of the right breast. The biopsy marking clip is approximately 5 mm medial to the biopsied calcifications. IMPRESSION: Coil shaped clip is approximately 5 mm medial to the biopsied calcifications in the upper-outer quadrant of the right breast. Final Assessment: Post Procedure Mammograms for Marker Placement Electronically Signed   By: Lillia Mountain M.D.   On: 10/09/2020 11:27   MM RT BREAST BX W LOC DEV 1ST LESION IMAGE BX SPEC STEREO GUIDE  Addendum Date: 10/14/2020   ADDENDUM REPORT: 10/10/2020 11:56 ADDENDUM: Pathology revealed INTERMEDIATE to HIGH GRADE DUCTAL CARCINOMA IN SITU WITH NECROSIS AND CALCIFICATIONS of the Right breast, upper outer quadrant. This was found to be concordant by Dr. Lillia Mountain. Pathology results were discussed with the patient by telephone. The patient reported doing well after the biopsy with tenderness at the site. Post biopsy instructions and care were reviewed and questions were  answered. The patient was encouraged to call [facility] for any additional concerns. My direct phone number was provided. The patient was referred to [facility] on October 16, 2020. Pathology results reported by Terie Purser, RN on 10/10/2020. Electronically Signed   By: Lillia Mountain M.D.   On: 10/10/2020 11:56   Result Date: 10/14/2020 CLINICAL DATA:  Right breast calcifications. Patient has a remote history of right breast cancer. EXAM: RIGHT BREAST STEREOTACTIC CORE NEEDLE BIOPSY COMPARISON:  Previous exams. FINDINGS: The patient and I discussed the procedure of stereotactic-guided biopsy including benefits and alternatives. We discussed the high likelihood of a successful procedure. We discussed the risks of the procedure including infection, bleeding, tissue injury, clip migration, and inadequate sampling. Informed written consent was given. The usual time out protocol was performed immediately prior to the procedure. Using sterile technique and 1% lidocaine and 1% lidocaine with epinephrine as local anesthetic, under stereotactic guidance, a 9 gauge vacuum assisted device was used to perform core needle biopsy of calcifications in the upper-outer quadrant of the right breast using a lateral to medial approach. Specimen radiograph was performed showing calcifications are present in the tissue samples. Specimens with calcifications are identified for pathology. Lesion quadrant: Upper-outer quadrant At the conclusion of the procedure, coil shaped tissue marker clip was deployed into the biopsy cavity. Follow-up 2-view mammogram was performed and dictated separately. IMPRESSION: Stereotactic-guided biopsy of the right breast. No apparent complications. Electronically Signed: By: Lillia Mountain M.D. On: 10/09/2020 11:12    ASSESSMENT & PLAN:  Ashley Rasmussen is a 70 y.o. Caucasian female with a history of HLD,  Osteopenia/Osteoporosis, H/o left breast cancer   1. Right breast DCIS, grade III, ER+/PR+  -I discussed her breast imaging and needle biopsy results with patient and her family members in great detail.  She has 26m mass in the right breast with DCIS, calcifications and necrosis.  -She is a candidate for breast conservation surgery. She has been seen by breast surgeon Dr. TMarlou Starks who recommends lumpectomy. Given her prior right lumpectomy and latissimus flap surgeries, she is interested in mastectomy.  -Her DCIS will be cured by complete surgical resection. Any form of adjuvant therapy is preventive. After mastectomy. -no need for postmastectomy radiation. She will still consult with Dr MLisbeth Renshawtoday.   -Given her strongly positive/negative ER and PR, I discussed the benefit of antiestrogen therapy, which decrease her risk of future left breast cancer by ~40%. However her risk of left breast cancer in her life time is probably less than 20% after right mastectomy, so I do not feel strongly she needs antiestrogen therapy. She previously did not tolerate Tamoxifen  -We also discussed that biopsy may have sampling limitation, we will review her surgical path, to see if she has any invasive carcinoma components. -We discussed breast cancer surveillance after she completes treatment, Including annual mammogram, breast exam every 6-12 months. I discussed the option of additional screening with annual breast MRIs. I also discussed Abbreviated MRIs which have $400 out-of-pocket cost if insurance does not cover this. She is interested. -Labs reviewed, CBC and CMP WNL except BG 124. Physical exam showed likely bleeding at biopsy site.  -Proceed with surgery. I will f/u with her after surgery.    2. H/o Right breast cancer in 1992 [place]S/p lumpectomy with 22 negative LN removed, 6 months of chemo and Radiation and 1 year for Tamoxifen (did not tolerate well with  depression) -She notes she had latissimus flap surgery due to breast damage form RT.    3. Genetic testing  -Her mother and sister had breast cancer and her sister also had melanoma. Given family history and her history of breast cancer, she is eligible for genetic testing. She is agreeable.  -I discussed if she id found to have pathogenetic mutations such as BRCA 1/2, she may consider prophylactic left breast mastectomy.  -I encouraged her to daughters to start mammograms in their 349s    4. Hyperglycemia  -Her fasting BG today is 124 (10/16/20). Pt notes her PCP is aware and she will continue workup and management.    5. Osteoporosis -Her 2009 DEXA showed lowest T-score at -2.4 at left femur.  -f/u with PCP    PLAN:  -Send genetic referral -Proceed with surgery (right mastectomy)  -F/u 1-2 months after surgery    No orders of the defined types were placed in this encounter.   All questions were answered. The patient knows to call the clinic with any problems, questions or concerns. The total time spent in the appointment was 50 minutes.     YTruitt Merle MD 10/16/2020 1:16 PM    I, AJoslyn Devon am acting as scribe for YTruitt Merle MD.   I have reviewed the above documentation for accuracy and completeness, and I agree with the above.

## 2020-10-17 ENCOUNTER — Telehealth: Payer: Self-pay | Admitting: Hematology

## 2020-10-17 ENCOUNTER — Encounter: Payer: Self-pay | Admitting: *Deleted

## 2020-10-17 NOTE — Telephone Encounter (Signed)
Scheduled per sch msg. Called and spoke with patient. Confirmed appt  

## 2020-10-17 NOTE — Telephone Encounter (Signed)
Checked out appointment. No LOS notes needing to be scheduled. No changes made. 

## 2020-10-23 ENCOUNTER — Encounter (HOSPITAL_BASED_OUTPATIENT_CLINIC_OR_DEPARTMENT_OTHER): Payer: Self-pay | Admitting: General Surgery

## 2020-10-23 ENCOUNTER — Other Ambulatory Visit: Payer: Self-pay

## 2020-10-24 ENCOUNTER — Encounter: Payer: Self-pay | Admitting: Genetic Counselor

## 2020-10-24 ENCOUNTER — Encounter: Payer: Self-pay | Admitting: *Deleted

## 2020-10-24 ENCOUNTER — Telehealth: Payer: Self-pay | Admitting: Genetic Counselor

## 2020-10-24 ENCOUNTER — Telehealth: Payer: Self-pay | Admitting: *Deleted

## 2020-10-24 DIAGNOSIS — Z1379 Encounter for other screening for genetic and chromosomal anomalies: Secondary | ICD-10-CM | POA: Insufficient documentation

## 2020-10-24 NOTE — Telephone Encounter (Signed)
Revealed negative genetic testing through the [facility] panel. Discussed that we do not know why she has breast cancer or why there is cancer in the family. There could be a genetic mutation in the family that Ashley Rasmussen did not inherit. There could also be a mutation in a different gene that we are not testing, or our current technology may not be able to detect certain mutations. It will therefore be important for her to stay in contact with genetics to keep up with whether additional testing may be appropriate in the future.   Additional testing through the [facility] panel is pending. We will call her once those results are available.

## 2020-10-24 NOTE — Telephone Encounter (Signed)
Spoke with patient to follow up from [facility] 3/23 and assess navigation needs. She states she has no questions or concerns at this time. Encouraged her to call should anything arise.  Patient verbalized understanding.

## 2020-10-26 ENCOUNTER — Other Ambulatory Visit (HOSPITAL_COMMUNITY)
Admission: RE | Admit: 2020-10-26 | Discharge: 2020-10-26 | Disposition: A | Discharge status: Home / Self Care | Payer: PPO | Source: Ambulatory Visit | Attending: General Surgery | Admitting: General Surgery

## 2020-10-26 DIAGNOSIS — Z01812 Encounter for preprocedural laboratory examination: Secondary | ICD-10-CM | POA: Diagnosis not present

## 2020-10-26 DIAGNOSIS — Z20822 Contact with and (suspected) exposure to covid-19: Secondary | ICD-10-CM | POA: Diagnosis not present

## 2020-10-26 LAB — SARS CORONAVIRUS 2 (TAT 6-24 HRS): SARS Coronavirus 2: NEGATIVE

## 2020-10-28 NOTE — Progress Notes (Signed)
      Enhanced Recovery after Surgery for Orthopedics Enhanced Recovery after Surgery is a protocol used to improve the stress on your body and your recovery after surgery.  Patient Instructions  . The night before surgery:  o No food after midnight. ONLY clear liquids after midnight  . The day of surgery (if you do NOT have diabetes):  o Drink ONE (1) Pre-Surgery Clear Ensure as directed.   o This drink was given to you during your hospital  pre-op appointment visit. o The pre-op nurse will instruct you on the time to drink the  Pre-Surgery Ensure depending on your surgery time. o Finish the drink at the designated time by the pre-op nurse.  o Nothing else to drink after completing the  Pre-Surgery Clear Ensure.  . The day of surgery (if you have diabetes): o Drink ONE (1) Gatorade 2 (G2) as directed. o This drink was given to you during your hospital  pre-op appointment visit.  o The pre-op nurse will instruct you on the time to drink the   Gatorade 2 (G2) depending on your surgery time. o Color of the Gatorade may vary. Red is not allowed. o Nothing else to drink after completing the  Gatorade 2 (G2).         If you have questions, please contact your surgeon's office.  Given surgical soap and instructions. Verbalized understanding.

## 2020-10-30 ENCOUNTER — Ambulatory Visit (HOSPITAL_BASED_OUTPATIENT_CLINIC_OR_DEPARTMENT_OTHER): Payer: PPO | Admitting: Anesthesiology

## 2020-10-30 ENCOUNTER — Encounter (HOSPITAL_BASED_OUTPATIENT_CLINIC_OR_DEPARTMENT_OTHER): Payer: Self-pay | Admitting: General Surgery

## 2020-10-30 ENCOUNTER — Ambulatory Visit (HOSPITAL_BASED_OUTPATIENT_CLINIC_OR_DEPARTMENT_OTHER)
Admission: RE | Admit: 2020-10-30 | Discharge: 2020-10-30 | Disposition: A | Discharge status: Home / Self Care | Payer: PPO | Attending: General Surgery | Admitting: General Surgery

## 2020-10-30 ENCOUNTER — Encounter (HOSPITAL_BASED_OUTPATIENT_CLINIC_OR_DEPARTMENT_OTHER)
Admission: RE | Disposition: A | Discharge status: Home / Self Care | Payer: Self-pay | Source: Home / Self Care | Attending: General Surgery

## 2020-10-30 ENCOUNTER — Other Ambulatory Visit: Payer: Self-pay

## 2020-10-30 DIAGNOSIS — Z853 Personal history of malignant neoplasm of breast: Secondary | ICD-10-CM | POA: Diagnosis not present

## 2020-10-30 DIAGNOSIS — N6091 Unspecified benign mammary dysplasia of right breast: Secondary | ICD-10-CM | POA: Diagnosis not present

## 2020-10-30 DIAGNOSIS — Z170 Estrogen receptor positive status [ER+]: Secondary | ICD-10-CM | POA: Insufficient documentation

## 2020-10-30 DIAGNOSIS — E785 Hyperlipidemia, unspecified: Secondary | ICD-10-CM | POA: Diagnosis not present

## 2020-10-30 DIAGNOSIS — N6489 Other specified disorders of breast: Secondary | ICD-10-CM | POA: Diagnosis not present

## 2020-10-30 DIAGNOSIS — Z87891 Personal history of nicotine dependence: Secondary | ICD-10-CM | POA: Diagnosis not present

## 2020-10-30 DIAGNOSIS — Z923 Personal history of irradiation: Secondary | ICD-10-CM | POA: Insufficient documentation

## 2020-10-30 DIAGNOSIS — Z8616 Personal history of COVID-19: Secondary | ICD-10-CM | POA: Insufficient documentation

## 2020-10-30 DIAGNOSIS — Z9221 Personal history of antineoplastic chemotherapy: Secondary | ICD-10-CM | POA: Diagnosis not present

## 2020-10-30 DIAGNOSIS — D0511 Intraductal carcinoma in situ of right breast: Secondary | ICD-10-CM | POA: Diagnosis present

## 2020-10-30 DIAGNOSIS — J309 Allergic rhinitis, unspecified: Secondary | ICD-10-CM | POA: Diagnosis not present

## 2020-10-30 DIAGNOSIS — G8918 Other acute postprocedural pain: Secondary | ICD-10-CM | POA: Diagnosis not present

## 2020-10-30 HISTORY — DX: Other specified postprocedural states: Z98.890

## 2020-10-30 HISTORY — PX: TOTAL MASTECTOMY: SHX6129

## 2020-10-30 HISTORY — PX: MASTECTOMY: SHX3

## 2020-10-30 HISTORY — DX: Nausea with vomiting, unspecified: R11.2

## 2020-10-30 HISTORY — DX: Other complications of anesthesia, initial encounter: T88.59XA

## 2020-10-30 SURGERY — MASTECTOMY, SIMPLE
Anesthesia: Regional | Site: Breast | Laterality: Right

## 2020-10-30 MED ORDER — ONDANSETRON HCL 4 MG/2ML IJ SOLN
INTRAMUSCULAR | Status: AC
  Filled 2020-10-30: qty 2

## 2020-10-30 MED ORDER — FENTANYL CITRATE (PF) 100 MCG/2ML IJ SOLN
INTRAMUSCULAR | Status: AC
  Filled 2020-10-30: qty 2

## 2020-10-30 MED ORDER — CEFAZOLIN SODIUM-DEXTROSE 2-4 GM/100ML-% IV SOLN
INTRAVENOUS | Status: AC
  Filled 2020-10-30: qty 100

## 2020-10-30 MED ORDER — LIDOCAINE HCL (CARDIAC) PF 100 MG/5ML IV SOSY
PREFILLED_SYRINGE | INTRAVENOUS | Status: DC | PRN
  Administered 2020-10-30: 40 mg via INTRATRACHEAL

## 2020-10-30 MED ORDER — ARTIFICIAL TEARS OPHTHALMIC OINT
TOPICAL_OINTMENT | OPHTHALMIC | Status: AC
  Filled 2020-10-30: qty 3.5

## 2020-10-30 MED ORDER — ONDANSETRON HCL 4 MG/2ML IJ SOLN
INTRAMUSCULAR | Status: DC | PRN
  Administered 2020-10-30: 4 mg via INTRAVENOUS

## 2020-10-30 MED ORDER — MIDAZOLAM HCL 2 MG/2ML IJ SOLN
1.0000 mg | Freq: Once | INTRAMUSCULAR | Status: AC
  Administered 2020-10-30: 1 mg via INTRAVENOUS

## 2020-10-30 MED ORDER — GABAPENTIN 300 MG PO CAPS
300.0000 mg | ORAL_CAPSULE | ORAL | Status: AC
  Administered 2020-10-30: 300 mg via ORAL

## 2020-10-30 MED ORDER — PHENYLEPHRINE 40 MCG/ML (10ML) SYRINGE FOR IV PUSH (FOR BLOOD PRESSURE SUPPORT)
PREFILLED_SYRINGE | INTRAVENOUS | Status: AC
  Filled 2020-10-30: qty 10

## 2020-10-30 MED ORDER — ONDANSETRON HCL 4 MG/2ML IJ SOLN: 4.0000 mg | Freq: Four times a day (QID) | INTRAMUSCULAR | Status: DC | PRN

## 2020-10-30 MED ORDER — AMISULPRIDE (ANTIEMETIC) 5 MG/2ML IV SOLN
INTRAVENOUS | Status: AC
  Filled 2020-10-30: qty 2

## 2020-10-30 MED ORDER — LACTATED RINGERS IV SOLN: INTRAVENOUS | Status: DC | PRN

## 2020-10-30 MED ORDER — AMISULPRIDE (ANTIEMETIC) 5 MG/2ML IV SOLN: 5.0000 mg | Freq: Once | INTRAVENOUS | Status: DC | PRN

## 2020-10-30 MED ORDER — SODIUM CHLORIDE 0.9 % IV SOLN: INTRAVENOUS | Status: DC

## 2020-10-30 MED ORDER — CHLORHEXIDINE GLUCONATE CLOTH 2 % EX PADS: 6.0000 | MEDICATED_PAD | Freq: Once | CUTANEOUS | Status: DC

## 2020-10-30 MED ORDER — MIDAZOLAM HCL 2 MG/2ML IJ SOLN
INTRAMUSCULAR | Status: AC
  Filled 2020-10-30: qty 2

## 2020-10-30 MED ORDER — EPHEDRINE 5 MG/ML INJ
INTRAVENOUS | Status: AC
  Filled 2020-10-30: qty 10

## 2020-10-30 MED ORDER — PROPOFOL 10 MG/ML IV BOLUS
INTRAVENOUS | Status: AC
  Filled 2020-10-30: qty 20

## 2020-10-30 MED ORDER — LIDOCAINE 2% (20 MG/ML) 5 ML SYRINGE
INTRAMUSCULAR | Status: AC
  Filled 2020-10-30: qty 5

## 2020-10-30 MED ORDER — GLYCOPYRROLATE 0.2 MG/ML IJ SOLN
INTRAMUSCULAR | Status: DC | PRN
  Administered 2020-10-30: .1 mg via INTRAVENOUS

## 2020-10-30 MED ORDER — CEFAZOLIN SODIUM-DEXTROSE 2-4 GM/100ML-% IV SOLN
2.0000 g | INTRAVENOUS | Status: AC
  Administered 2020-10-30: 2 g via INTRAVENOUS

## 2020-10-30 MED ORDER — HYDROCODONE-ACETAMINOPHEN 5-325 MG PO TABS
1.0000 | ORAL_TABLET | ORAL | Status: DC | PRN
Start: 2020-10-30 — End: 2020-10-30

## 2020-10-30 MED ORDER — ACETAMINOPHEN 10 MG/ML IV SOLN
INTRAVENOUS | Status: AC
  Filled 2020-10-30: qty 100

## 2020-10-30 MED ORDER — HYDROCODONE-ACETAMINOPHEN 5-325 MG PO TABS: 1.0000 | ORAL_TABLET | Freq: Four times a day (QID) | ORAL | 0 refills | Status: DC | PRN

## 2020-10-30 MED ORDER — ONDANSETRON 4 MG PO TBDP: 4.0000 mg | ORAL_TABLET | Freq: Four times a day (QID) | ORAL | Status: DC | PRN

## 2020-10-30 MED ORDER — DEXAMETHASONE SODIUM PHOSPHATE 10 MG/ML IJ SOLN
INTRAMUSCULAR | Status: DC | PRN
  Administered 2020-10-30: 5 mg via INTRAVENOUS

## 2020-10-30 MED ORDER — FENTANYL CITRATE (PF) 100 MCG/2ML IJ SOLN
INTRAMUSCULAR | Status: DC | PRN
  Administered 2020-10-30: 50 ug via INTRAVENOUS
  Administered 2020-10-30 (×2): 25 ug via INTRAVENOUS

## 2020-10-30 MED ORDER — GLYCOPYRROLATE PF 0.2 MG/ML IJ SOSY
PREFILLED_SYRINGE | INTRAMUSCULAR | Status: AC
  Filled 2020-10-30: qty 1

## 2020-10-30 MED ORDER — KETOROLAC TROMETHAMINE 15 MG/ML IJ SOLN: 15.0000 mg | Freq: Once | INTRAMUSCULAR | Status: DC | PRN

## 2020-10-30 MED ORDER — ONDANSETRON HCL 4 MG/2ML IJ SOLN: 4.0000 mg | Freq: Once | INTRAMUSCULAR | Status: DC | PRN

## 2020-10-30 MED ORDER — EPHEDRINE 5 MG/ML INJ
INTRAVENOUS | Status: AC
  Filled 2020-10-30: qty 50

## 2020-10-30 MED ORDER — PANTOPRAZOLE SODIUM 40 MG PO TBEC: 40.0000 mg | DELAYED_RELEASE_TABLET | Freq: Every day | ORAL | Status: DC

## 2020-10-30 MED ORDER — FENTANYL CITRATE (PF) 100 MCG/2ML IJ SOLN: 25.0000 ug | INTRAMUSCULAR | Status: DC | PRN

## 2020-10-30 MED ORDER — ACETAMINOPHEN 500 MG PO TABS
ORAL_TABLET | ORAL | Status: AC
  Filled 2020-10-30: qty 2

## 2020-10-30 MED ORDER — METHOCARBAMOL 500 MG PO TABS: 500.0000 mg | ORAL_TABLET | Freq: Four times a day (QID) | ORAL | Status: DC | PRN

## 2020-10-30 MED ORDER — ROPIVACAINE HCL 5 MG/ML IJ SOLN
INTRAMUSCULAR | Status: DC | PRN
  Administered 2020-10-30: 30 mL via PERINEURAL

## 2020-10-30 MED ORDER — ACETAMINOPHEN 500 MG PO TABS: 1000.0000 mg | ORAL_TABLET | ORAL | Status: DC

## 2020-10-30 MED ORDER — HEPARIN SODIUM (PORCINE) 5000 UNIT/ML IJ SOLN: 5000.0000 [IU] | Freq: Three times a day (TID) | INTRAMUSCULAR | Status: DC

## 2020-10-30 MED ORDER — METHOCARBAMOL 500 MG PO TABS: 500.0000 mg | ORAL_TABLET | Freq: Four times a day (QID) | ORAL | 1 refills | Status: DC | PRN

## 2020-10-30 MED ORDER — PHENYLEPHRINE HCL (PRESSORS) 10 MG/ML IV SOLN
INTRAVENOUS | Status: DC | PRN
  Administered 2020-10-30: 80 ug via INTRAVENOUS
  Administered 2020-10-30: 40 ug via INTRAVENOUS
  Administered 2020-10-30: 120 ug via INTRAVENOUS
  Administered 2020-10-30 (×2): 80 ug via INTRAVENOUS

## 2020-10-30 MED ORDER — ACETAMINOPHEN 10 MG/ML IV SOLN
1000.0000 mg | Freq: Once | INTRAVENOUS | Status: AC
  Administered 2020-10-30: 1000 mg via INTRAVENOUS

## 2020-10-30 MED ORDER — AMISULPRIDE (ANTIEMETIC) 5 MG/2ML IV SOLN
5.0000 mg | Freq: Once | INTRAVENOUS | Status: AC
  Administered 2020-10-30: 5 mg via INTRAVENOUS

## 2020-10-30 MED ORDER — ARTIFICIAL TEARS OPHTHALMIC OINT
TOPICAL_OINTMENT | OPHTHALMIC | Status: DC | PRN
  Administered 2020-10-30: 1 via OPHTHALMIC

## 2020-10-30 MED ORDER — EPHEDRINE SULFATE 50 MG/ML IJ SOLN
INTRAMUSCULAR | Status: DC | PRN
  Administered 2020-10-30: 5 mg via INTRAVENOUS

## 2020-10-30 MED ORDER — MIDAZOLAM HCL 2 MG/2ML IJ SOLN
INTRAMUSCULAR | Status: DC | PRN
  Administered 2020-10-30 (×2): 1 mg via INTRAVENOUS

## 2020-10-30 MED ORDER — AMISULPRIDE (ANTIEMETIC) 5 MG/2ML IV SOLN: 5.0000 mg | Freq: Once | INTRAVENOUS | Status: DC

## 2020-10-30 MED ORDER — DEXAMETHASONE SODIUM PHOSPHATE 10 MG/ML IJ SOLN
INTRAMUSCULAR | Status: AC
  Filled 2020-10-30: qty 1

## 2020-10-30 MED ORDER — GABAPENTIN 300 MG PO CAPS
ORAL_CAPSULE | ORAL | Status: AC
  Filled 2020-10-30: qty 1

## 2020-10-30 MED ORDER — PROPOFOL 500 MG/50ML IV EMUL
INTRAVENOUS | Status: DC | PRN
  Administered 2020-10-30: 25 ug/kg/min via INTRAVENOUS

## 2020-10-30 MED ORDER — MORPHINE SULFATE (PF) 4 MG/ML IV SOLN
1.0000 mg | INTRAVENOUS | Status: DC | PRN
Start: 2020-10-30 — End: 2020-10-30

## 2020-10-30 MED ORDER — PROPOFOL 10 MG/ML IV BOLUS
INTRAVENOUS | Status: DC | PRN
  Administered 2020-10-30: 200 mg via INTRAVENOUS

## 2020-10-30 SURGICAL SUPPLY — 47 items
ADH SKN CLS APL DERMABOND .7 (GAUZE/BANDAGES/DRESSINGS) ×1
APL PRP STRL LF DISP 70% ISPRP (MISCELLANEOUS) ×1
APPLIER CLIP 9.375 MED OPEN (MISCELLANEOUS) ×2
APR CLP MED 9.3 20 MLT OPN (MISCELLANEOUS) ×1
BINDER BREAST XLRG (GAUZE/BANDAGES/DRESSINGS) ×1 IMPLANT
BINDER BREAST XXLRG (GAUZE/BANDAGES/DRESSINGS) IMPLANT
BIOPATCH RED 1 DISK 7.0 (GAUZE/BANDAGES/DRESSINGS) ×2 IMPLANT
BLADE SURG 10 STRL SS (BLADE) ×2 IMPLANT
BLADE SURG 15 STRL LF DISP TIS (BLADE) ×1 IMPLANT
BLADE SURG 15 STRL SS (BLADE) ×2
CANISTER SUCT 1200ML W/VALVE (MISCELLANEOUS) ×2 IMPLANT
CHLORAPREP W/TINT 26 (MISCELLANEOUS) ×2 IMPLANT
CLIP APPLIE 9.375 MED OPEN (MISCELLANEOUS) ×1 IMPLANT
COVER BACK TABLE 60X90IN (DRAPES) ×2 IMPLANT
COVER MAYO STAND STRL (DRAPES) ×2 IMPLANT
DERMABOND ADVANCED (GAUZE/BANDAGES/DRESSINGS) ×1
DERMABOND ADVANCED .7 DNX12 (GAUZE/BANDAGES/DRESSINGS) ×1 IMPLANT
DEVICE DSSCT PLSMBLD 3.0S LGHT (MISCELLANEOUS) ×1 IMPLANT
DRAIN CHANNEL 19F RND (DRAIN) ×2 IMPLANT
DRAPE LAPAROSCOPIC ABDOMINAL (DRAPES) ×2 IMPLANT
DRAPE UTILITY XL STRL (DRAPES) ×2 IMPLANT
DRSG PAD ABDOMINAL 8X10 ST (GAUZE/BANDAGES/DRESSINGS) ×2 IMPLANT
DRSG TEGADERM 2-3/8X2-3/4 SM (GAUZE/BANDAGES/DRESSINGS) ×2 IMPLANT
ELECT REM PT RETURN 9FT ADLT (ELECTROSURGICAL) ×2
ELECTRODE REM PT RTRN 9FT ADLT (ELECTROSURGICAL) ×1 IMPLANT
EVACUATOR SILICONE 100CC (DRAIN) ×2 IMPLANT
GAUZE SPONGE 4X4 12PLY STRL LF (GAUZE/BANDAGES/DRESSINGS) ×2 IMPLANT
GLOVE SURG ENC MOIS LTX SZ6.5 (GLOVE) ×1 IMPLANT
GLOVE SURG ENC MOIS LTX SZ7.5 (GLOVE) ×2 IMPLANT
GOWN STRL REUS W/ TWL LRG LVL3 (GOWN DISPOSABLE) ×2 IMPLANT
GOWN STRL REUS W/TWL LRG LVL3 (GOWN DISPOSABLE) ×4
NDL HYPO 25X1 1.5 SAFETY (NEEDLE) ×1 IMPLANT
NEEDLE HYPO 25X1 1.5 SAFETY (NEEDLE) ×2 IMPLANT
NS IRRIG 1000ML POUR BTL (IV SOLUTION) ×2 IMPLANT
PACK BASIN DAY SURGERY FS (CUSTOM PROCEDURE TRAY) ×2 IMPLANT
PIN SAFETY STERILE (MISCELLANEOUS) ×2 IMPLANT
PLASMABLADE 3.0S W/LIGHT (MISCELLANEOUS) ×2
SLEEVE SCD COMPRESS KNEE MED (STOCKING) ×2 IMPLANT
SPONGE LAP 18X18 RF (DISPOSABLE) ×3 IMPLANT
SUT ETHILON 2 0 FS 18 (SUTURE) ×2 IMPLANT
SUT MNCRL AB 4-0 PS2 18 (SUTURE) ×2 IMPLANT
SUT SILK 2 0 SH (SUTURE) ×1 IMPLANT
SUT VICRYL 3-0 CR8 SH (SUTURE) ×2 IMPLANT
SYR CONTROL 10ML LL (SYRINGE) ×2 IMPLANT
TOWEL GREEN STERILE FF (TOWEL DISPOSABLE) ×2 IMPLANT
TUBE CONNECTING 20X1/4 (TUBING) ×2 IMPLANT
YANKAUER SUCT BULB TIP NO VENT (SUCTIONS) ×2 IMPLANT

## 2020-10-30 NOTE — Anesthesia Preprocedure Evaluation (Addendum)
Anesthesia Evaluation  Patient identified by MRN, date of birth, ID band Patient awake    Reviewed: Allergy & Precautions, NPO status , Patient's Chart, lab work & pertinent test results  History of Anesthesia Complications (+) PONV and history of anesthetic complications  Airway Mallampati: II  TM Distance: >3 FB Neck ROM: Full    Dental  (+) Upper Dentures   Pulmonary former smoker,    Pulmonary exam normal breath sounds clear to auscultation       Cardiovascular negative cardio ROS Normal cardiovascular exam Rhythm:Regular Rate:Normal     Neuro/Psych negative neurological ROS  negative psych ROS   GI/Hepatic Neg liver ROS, GERD  Medicated and Controlled,  Endo/Other  negative endocrine ROS  Renal/GU negative Renal ROS     Musculoskeletal negative musculoskeletal ROS (+)   Abdominal (+) + obese,   Peds  Hematology HLD   Anesthesia Other Findings RIGHT BREAST DCIS  Reproductive/Obstetrics                            Anesthesia Physical Anesthesia Plan  ASA: II  Anesthesia Plan: General and Regional   Post-op Pain Management: GA combined w/ Regional for post-op pain   Induction: Intravenous  PONV Risk Score and Plan: 4 or greater and Ondansetron, Dexamethasone, Treatment may vary due to age or medical condition, Midazolam and Amisulpride  Airway Management Planned: LMA  Additional Equipment:   Intra-op Plan:   Post-operative Plan: Extubation in OR  Informed Consent: I have reviewed the patients History and Physical, chart, labs and discussed the procedure including the risks, benefits and alternatives for the proposed anesthesia with the patient or authorized representative who has indicated his/her understanding and acceptance.     Dental advisory given  Plan Discussed with: CRNA  Anesthesia Plan Comments:       Anesthesia Quick Evaluation

## 2020-10-30 NOTE — Discharge Instructions (Signed)
No Tylenol until 4:15 pm  Post Anesthesia Home Care Instructions  Activity: Get plenty of rest for the remainder of the day. A responsible individual must stay with you for 24 hours following the procedure.  For the next 24 hours, DO NOT: -Drive a car -Paediatric nurse -Drink alcoholic beverages -Take any medication unless instructed by your physician -Make any legal decisions or sign important papers.  Meals: Start with liquid foods such as gelatin or soup. Progress to regular foods as tolerated. Avoid greasy, spicy, heavy foods. If nausea and/or vomiting occur, drink only clear liquids until the nausea and/or vomiting subsides. Call your physician if vomiting continues.  Special Instructions/Symptoms: Your throat may feel dry or sore from the anesthesia or the breathing tube placed in your throat during surgery. If this causes discomfort, gargle with warm salt water. The discomfort should disappear within 24 hours.  If you had a scopolamine patch placed behind your ear for the management of post- operative nausea and/or vomiting:  1. The medication in the patch is effective for 72 hours, after which it should be removed.  Wrap patch in a tissue and discard in the trash. Wash hands thoroughly with soap and water. 2. You may remove the patch earlier than 72 hours if you experience unpleasant side effects which may include dry mouth, dizziness or visual disturbances. 3. Avoid touching the patch. Wash your hands with soap and water after contact with the patch.    Regional Anesthesia Blocks  1. Numbness or the inability to move the "blocked" extremity may last from 3-48 hours after placement. The length of time depends on the medication injected and your individual response to the medication. If the numbness is not going away after 48 hours, call your surgeon.  2. The extremity that is blocked will need to be protected until the numbness is gone and the  Strength has returned. Because  you cannot feel it, you will need to take extra care to avoid injury. Because it may be weak, you may have difficulty moving it or using it. You may not know what position it is in without looking at it while the block is in effect.  3. For blocks in the legs and feet, returning to weight bearing and walking needs to be done carefully. You will need to wait until the numbness is entirely gone and the strength has returned. You should be able to move your leg and foot normally before you try and bear weight or walk. You will need someone to be with you when you first try to ensure you do not fall and possibly risk injury.  4. Bruising and tenderness at the needle site are common side effects and will resolve in a few days.  5. Persistent numbness or new problems with movement should be communicated to the surgeon or the [facility] [phone] [place] [phone]).  About my Jackson-Pratt Bulb Drain  What is a Jackson-Pratt bulb? A Jackson-Pratt is a soft, round device used to collect drainage. It is connected to a long, thin drainage catheter, which is held in place by one or two small stiches near your surgical incision site. When the bulb is squeezed, it forms a vacuum, forcing the drainage to empty into the bulb.  Emptying the Jackson-Pratt bulb- To empty the bulb: 1. Release the plug on the top of the bulb. 2. Pour the bulb's contents into a measuring container which your nurse will provide. 3. Record the time emptied  and amount of drainage. Empty the drain(s) as often as your     doctor or nurse recommends.  Date                  Time                    Amount (Drain 1)                 Amount (Drain  2)  _____________________________________________________________________  _____________________________________________________________________  _____________________________________________________________________  _____________________________________________________________________  _____________________________________________________________________  _____________________________________________________________________  _____________________________________________________________________  _____________________________________________________________________  Squeezing the Jackson-Pratt Bulb- To squeeze the bulb: 1. Make sure the plug at the top of the bulb is open. 2. Squeeze the bulb tightly in your fist. You will hear air squeezing from the bulb. 3. Replace the plug while the bulb is squeezed. 4. Use a safety pin to attach the bulb to your clothing. This will keep the catheter from     pulling at the bulb insertion site.  When to call your doctor- Call your doctor if:  Drain site becomes red, swollen or hot.  You have a fever greater than 101 degrees F.  There is oozing at the drain site.  Drain falls out (apply a guaze bandage over the drain hole and secure it with tape).  Drainage increases daily not related to activity patterns. (You will usually have more drainage when you are active than when you are resting.)  Drainage has a bad odor.

## 2020-10-30 NOTE — Interval H&P Note (Signed)
History and Physical Interval Note:  10/30/2020 10:02 AM  Ashley Rasmussen  has presented today for surgery, with the diagnosis of RIGHT BREAST DCIS.  The various methods of treatment have been discussed with the patient and family. After consideration of risks, benefits and other options for treatment, the patient has consented to  Procedure(s): RIGHT MASTECTOMY (Right) as a surgical intervention.  The patient's history has been reviewed, patient examined, no change in status, stable for surgery.  I have reviewed the patient's chart and labs.  Questions were answered to the patient's satisfaction.     Autumn Messing III

## 2020-10-30 NOTE — Anesthesia Procedure Notes (Signed)
Procedure Name: LMA Insertion Date/Time: 10/30/2020 10:13 AM Performed by: Collier Bullock, CRNA Pre-anesthesia Checklist: Patient identified, Emergency Drugs available, Suction available and Patient being monitored Patient Re-evaluated:Patient Re-evaluated prior to induction Oxygen Delivery Method: Circle system utilized Preoxygenation: Pre-oxygenation with 100% oxygen Induction Type: IV induction LMA: LMA inserted LMA Size: 4.0 Number of attempts: 1 Placement Confirmation: positive ETCO2 and breath sounds checked- equal and bilateral Tube secured with: Tape Dental Injury: Teeth and Oropharynx as per pre-operative assessment

## 2020-10-30 NOTE — Progress Notes (Signed)
Assisted Dr. Roanna Banning with right, ultrasound guided, axillary block. Side rails up, monitors on throughout procedure. See vital signs in flow sheet. Tolerated Procedure well.

## 2020-10-30 NOTE — H&P (Signed)
Ashley Rasmussen  Location: [facility] Surgery Patient #: [id number] DOB: 1950/09/24 Undefined / Language: Cleophus Molt / Race: White Female   History of Present Illness The patient is a 70 year old female who presents with breast cancer.We are asked to see the patient in consultation by Dr. Burr Medico to evaluate her for a new right breast cancer. The patient is a 70 year old white female who presents with calcs in the UOQ of the right breast measuring 40mm. This was biopsied and came back as DCIS that was ER and PR+. She does have a history of right breast cancer treated with lumpectomy, chemo, and radiation in '92. She had 22 lymph nodes removed. She had a wound issue postop and had to have a latissimus flap for coverage and wound healing. She is otherwise in good health and does not smoke   she has had surgery on her mastoid at [facility] recently and will likely need more surgery. she thinks this is from covid a year ago   Past Surgical History  Breast Biopsy  Right. multiple Breast Mass; Local Excision  Right. Cesarean Section - 1   Diagnostic Studies History  Colonoscopy  1-5 years ago Mammogram  within last year Pap Smear  >5 years ago  Medication History  Medications Reconciled  Social History  Alcohol use  Remotely quit alcohol use. Caffeine use  Carbonated beverages. No drug use  Tobacco use  Former smoker.  Family History Alcohol Abuse  Father. Arthritis  Mother. Bleeding disorder  Mother. Breast Cancer  Mother, Sister. Melanoma  Mother.  Pregnancy / Birth History  Age at menarche  53 years. Age of menopause  <44 Contraceptive History  Oral contraceptives. Gravida  4 Length (months) of breastfeeding  7-12 Maternal age  12-20 Para  4 Regular periods   Other Problems Breast Cancer  General anesthesia - complications  Hypercholesterolemia     Review of Systems  General Not Present- Appetite Loss, Chills, Fatigue, Fever, Night Sweats,  Weight Gain and Weight Loss. Skin Not Present- Change in Wart/Mole, Dryness, Hives, Jaundice, New Lesions, Non-Healing Wounds, Rash and Ulcer. HEENT Present- Hearing Loss and Vertigo. Not Present- Earache, Hoarseness, Nose Bleed, Oral Ulcers, Ringing in the Ears, Seasonal Allergies, Sinus Pain, Sore Throat, Visual Disturbances, Wears glasses/contact lenses and Yellow Eyes. Respiratory Not Present- Bloody sputum, Chronic Cough, Difficulty Breathing, Snoring and Wheezing. Breast Not Present- Breast Mass, Breast Pain, Nipple Discharge and Skin Changes. Cardiovascular Not Present- Chest Pain, Difficulty Breathing Lying Down, Leg Cramps, Palpitations, Rapid Heart Rate, Shortness of Breath and Swelling of Extremities. Gastrointestinal Not Present- Abdominal Pain, Bloating, Bloody Stool, Change in Bowel Habits, Chronic diarrhea, Constipation, Difficulty Swallowing, Excessive gas, Gets full quickly at meals, Hemorrhoids, Indigestion, Nausea, Rectal Pain and Vomiting. Female Genitourinary Not Present- Frequency, Nocturia, Painful Urination, Pelvic Pain and Urgency. Musculoskeletal Not Present- Back Pain, Joint Pain, Joint Stiffness, Muscle Pain, Muscle Weakness and Swelling of Extremities. Neurological Not Present- Decreased Memory, Fainting, Headaches, Numbness, Seizures, Tingling, Tremor, Trouble walking and Weakness. Psychiatric Not Present- Anxiety, Bipolar, Change in Sleep Pattern, Depression, Fearful and Frequent crying. Endocrine Not Present- Cold Intolerance, Excessive Hunger, Hair Changes, Heat Intolerance, Hot flashes and New Diabetes. Hematology Not Present- Blood Thinners, Easy Bruising, Excessive bleeding, Gland problems, HIV and Persistent Infections.   Physical Exam General Mental Status-Alert. General Appearance-Consistent with stated age. Hydration-Well hydrated. Voice-Normal.  Head and Neck Head-normocephalic, atraumatic with no lesions or palpable  masses. Trachea-midline. Thyroid Gland Characteristics - normal size and consistency.  Eye Eyeball -  Bilateral-Extraocular movements intact. Sclera/Conjunctiva - Bilateral-No scleral icterus.  Chest and Lung Exam Chest and lung exam reveals -quiet, even and easy respiratory effort with no use of accessory muscles and on auscultation, normal breath sounds, no adventitious sounds and normal vocal resonance. Inspection Chest Wall - Normal. Back - normal.  Breast Note: there is no palpable mass in either breast. there is no palpable axillary, supraclavicular, or cervical lymphadenopathy. there is no lymphedema of right arm. There is a well healed lat flap at upper pole of right breast   Cardiovascular Cardiovascular examination reveals -normal heart sounds, regular rate and rhythm with no murmurs and normal pedal pulses bilaterally.  Abdomen Inspection Inspection of the abdomen reveals - No Hernias. Skin - Scar - no surgical scars. Palpation/Percussion Palpation and Percussion of the abdomen reveal - Soft, Non Tender, No Rebound tenderness, No Rigidity (guarding) and No hepatosplenomegaly. Auscultation Auscultation of the abdomen reveals - Bowel sounds normal.  Neurologic Neurologic evaluation reveals -alert and oriented x 3 with no impairment of recent or remote memory. Mental Status-Normal.  Musculoskeletal Normal Exam - Left-Upper Extremity Strength Normal and Lower Extremity Strength Normal. Normal Exam - Right-Upper Extremity Strength Normal and Lower Extremity Strength Normal.  Lymphatic Head & Neck  General Head & Neck Lymphatics: Bilateral - Description - Normal. Axillary  General Axillary Region: Bilateral - Description - Normal. Tenderness - Non Tender. Femoral & Inguinal  Generalized Femoral & Inguinal Lymphatics: Bilateral - Description - Normal. Tenderness - Non Tender.    Assessment & Plan  DUCTAL CARCINOMA IN SITU (DCIS) OF RIGHT BREAST  (D05.11) Impression: The patient appears to have a small area of dcis in the UOQ of the right breast. With her history of radiation the best option for her would be mastectomy. I have discussed with her the risks and benefits of the surgery as well as some of the technical aspects including wound healing and she understands and wishes to proceed. she does not need a node evaluation. She will meed with medical oncology to discuss adjuvant therapy. This patient encounter took 60 minutes today to perform the following: take history, perform exam, review outside records, interpret imaging, counsel the patient on their diagnosis and document encounter, findings & plan in the EHR Current Plans Referred to Oncology, for evaluation and follow up (Oncology). Routine.

## 2020-10-30 NOTE — Op Note (Signed)
10/30/2020  11:15 AM  PATIENT:  Ashley Rasmussen  70 y.o. female  PRE-OPERATIVE DIAGNOSIS:  RIGHT BREAST DCIS  POST-OPERATIVE DIAGNOSIS:  RIGHT BREAST DCIS  PROCEDURE:  Procedure(s): RIGHT MASTECTOMY (Right)  SURGEON:  Surgeon(s) and Role:    * [name], MD - Primary  PHYSICIAN ASSISTANT:   ASSISTANTS: none   ANESTHESIA:   general  EBL:  25 mL   BLOOD ADMINISTERED:none  DRAINS: (1) Jackson-Pratt drain(s) with closed bulb suction in the prepectoral space   LOCAL MEDICATIONS USED:  NONE  SPECIMEN:  Source of Specimen:  right mastectomy  DISPOSITION OF SPECIMEN:  PATHOLOGY  COUNTS:  YES  TOURNIQUET:  * No tourniquets in log *  DICTATION: .Dragon Dictation   After informed consent was obtained the patient was brought to the operating room and placed in the supine position on the operating table.  After adequate induction of general anesthesia the patient's right chest, breast, and axillary area were prepped with ChloraPrep, allowed to dry, and draped in usual sterile manner.  An appropriate timeout was performed.  The patient had a previous latissimus flap to cover the upper portion of the breast and chest wall from a previous infection.  An elliptical incision was made around the nipple and areola complex with a 10 blade knife.  The incision was carried through the skin and subcutaneous tissue sharply with the PlasmaBlade.  Breast hooks were used to elevate the skin flaps anteriorly towards the ceiling.  Thin skin flaps were then created circumferentially by dissecting between the breast tissue and the subcutaneous fat.  Superiorly this dissection was carried out until we reached the edge of the latissimus flap.  The breast was then separated from the latissimus flap and then the breast was removed from the pectoralis muscle inferior to this with the pectoralis fashion.  The dissection was carried all the way to the chest wall circumferentially.  Once this was accomplished the  entire right breast was removed and the latissimus flap was healthy and intact.  The breast was marked with a stitch on the lateral skin and sent to pathology for further evaluation.  Hemostasis was achieved using the PlasmaBlade.  A small stab incision was made near the anterior axillary line inferior to the operative bed.  Tonsil clamp was placed through this opening and used to bring a 19 [place] round Blake drain into the operative bed.  The drain was placed along the chest wall inferiorly.  The drain was anchored to the skin with a 3-0 nylon stitch.  Next the superior and inferior skin flaps were grossly reapproximated with 3-0 Vicryl stitches.  The skin was then closed with a running 4-0 Monocryl subcuticular stitch.  Dermabond dressings were applied.  The drain was placed to bulb suction and there was a good seal.  The patient tolerated the procedure well.  All needle sponge and instrument counts were correct.  The patient was then awakened and taken to recovery in stable condition.  PLAN OF CARE: Admit for overnight observation  PATIENT DISPOSITION:  PACU - hemodynamically stable.   Delay start of Pharmacological VTE agent (>24hrs) due to surgical blood loss or risk of bleeding: no

## 2020-10-30 NOTE — Transfer of Care (Signed)
Immediate Anesthesia Transfer of Care Note  Patient: Ashley Rasmussen  Procedure(s) Performed: RIGHT MASTECTOMY (Right Breast)  Patient Location: PACU  Anesthesia Type:General and Regional  Level of Consciousness: awake, alert  and patient cooperative  Airway & Oxygen Therapy: Patient Spontanous Breathing and Patient connected to face mask  Post-op Assessment: Report given to RN, Post -op Vital signs reviewed and stable, Patient moving all extremities X 4 and Patient able to stick tongue midline  Post vital signs: Reviewed and stable  Last Vitals:  Vitals Value Taken Time  BP 139/78 10/30/20 1123  Temp    Pulse 93 10/30/20 1126  Resp 12 10/30/20 1126  SpO2 99 % 10/30/20 1126  Vitals shown include unvalidated device data.  Last Pain:  Vitals:   10/30/20 0940  TempSrc:   PainSc: 0-No pain      Patients Stated Pain Goal: 5 (01/17/75 2831)  Complications: No complications documented.

## 2020-10-30 NOTE — Anesthesia Postprocedure Evaluation (Signed)
Anesthesia Post Note  Patient: Ashley Rasmussen  Procedure(s) Performed: RIGHT MASTECTOMY (Right Breast)     Patient location during evaluation: PACU Anesthesia Type: Regional and General Level of consciousness: awake Pain management: pain level controlled Vital Signs Assessment: post-procedure vital signs reviewed and stable Respiratory status: spontaneous breathing, nonlabored ventilation, respiratory function stable and patient connected to nasal cannula oxygen Cardiovascular status: blood pressure returned to baseline and stable Postop Assessment: no apparent nausea or vomiting Anesthetic complications: no   No complications documented.  Last Vitals:  Vitals:   10/30/20 1319 10/30/20 1411  BP:  139/75  Pulse: 92 98  Resp:  18  Temp:  36.9 C  SpO2: 99% 96%    Last Pain:  Vitals:   10/30/20 1411  TempSrc:   PainSc: 0-No pain                 Joanmarie Tsang P Halynn Reitano

## 2020-10-30 NOTE — Anesthesia Procedure Notes (Signed)
Anesthesia Regional Block: Pectoralis block   Pre-Anesthetic Checklist: ,, timeout performed, Correct Patient, Correct Site, Correct Laterality, Correct Procedure, Correct Position, site marked, Risks and benefits discussed,  Surgical consent,  Pre-op evaluation,  At surgeon's request and post-op pain management  Laterality: Right  Prep: chloraprep       Needles:  Injection technique: Single-shot  Needle Type: Echogenic Stimulator Needle     Needle Length: 10cm  Needle Gauge: 20     Additional Needles:   Procedures:,,,, ultrasound used (permanent image in chart),,,,  Narrative:  Start time: 10/30/2020 9:35 AM End time: 10/30/2020 9:45 AM Injection made incrementally with aspirations every 5 mL.  Performed by: Personally  Anesthesiologist: Murvin Natal, MD  Additional Notes: Functioning IV was confirmed and monitors were applied.  A timeout was performed. Sterile prep, hand hygiene and sterile gloves were used. A 120mm 20ga BBraun echogenic stimulator needle was used. Negative aspiration and negative test dose prior to incremental administration of local anesthetic. The patient tolerated the procedure well.  Ultrasound guidance: relevent anatomy identified, needle position confirmed, local anesthetic spread visualized around nerve(s), vascular puncture avoided.  Image printed for medical record.

## 2020-10-31 ENCOUNTER — Encounter (HOSPITAL_BASED_OUTPATIENT_CLINIC_OR_DEPARTMENT_OTHER): Payer: Self-pay | Admitting: General Surgery

## 2020-10-31 NOTE — Addendum Note (Signed)
Addendum  created 10/31/20 1010 by Sitara Cashwell, Ernesta Amble, CRNA   Charge Capture section accepted

## 2020-11-01 LAB — SURGICAL PATHOLOGY

## 2020-11-04 ENCOUNTER — Ambulatory Visit: Payer: Self-pay | Admitting: Genetic Counselor

## 2020-11-04 DIAGNOSIS — Z1379 Encounter for other screening for genetic and chromosomal anomalies: Secondary | ICD-10-CM

## 2020-11-04 NOTE — Progress Notes (Signed)
HPI:  Ashley Rasmussen was previously seen in the [facility] due to a personal and family history of cancer and concerns regarding a hereditary predisposition to cancer. Please refer to our prior cancer genetics clinic note for more information regarding our discussion, assessment and recommendations, at the time. Ashley Rasmussen's recent genetic test results were disclosed to her, as were recommendations warranted by these results. These results and recommendations are discussed in more detail below.  CANCER HISTORY:  Oncology History Overview Note  Cancer Staging Ductal carcinoma in situ (DCIS) of right breast Staging form: Breast, AJCC 8th Edition - Clinical stage from 10/09/2020: Stage 0 (cTis (DCIS), cN0, cM0, G3, ER+, PR+, HER2-) - Signed by Feng, Yan, MD on 10/15/2020 Stage prefix: Initial diagnosis Histologic grading system: 3 grade system    Ductal carcinoma in situ (DCIS) of right breast  10/02/2020 Mammogram   IMPRESSION: Indeterminate calcifications in the right breast spanning 0.6 cm in the upper slightly outer right breast   10/09/2020 Cancer Staging   Staging form: Breast, AJCC 8th Edition - Clinical stage from 10/09/2020: Stage 0 (cTis (DCIS), cN0, cM0, G3, ER+, PR+, HER2-) - Signed by Feng, Yan, MD on 10/15/2020 Stage prefix: Initial diagnosis Histologic grading system: 3 grade system   10/09/2020 Initial Biopsy   Diagnosis Breast, right, needle core biopsy, right breast, uoq - DUCTAL CARCINOMA IN SITU WITH NECROSIS AND CALCIFICATIONS. - SEE MICROSCOPIC DESCRIPTION. Microscopic Comment The DCIS has intermediate to high nuclear grade. Estrogen and progesterone receptors will be performed.    Results: IMMUNOHISTOCHEMICAL AND MORPHOMETRIC ANALYSIS PERFORMED MANUALLY Estrogen Receptor: 95%, POSITIVE, STRONG STAINING INTENSITY Progesterone Receptor: 40%, POSITIVE, STRONG STAINING INTENSITY   10/10/2020 Initial Diagnosis   Ductal carcinoma in situ (DCIS) of right  breast   10/23/2020 Genetic Testing   Negative genetic testing:  No pathogenic variants detected on the [facility] BRCAplus panel or CancerNext-Expanded + RNAinsight panel. The report dates are 10/23/2020 and 10/30/2020, respectively.   The BRCAplus panel offered by [facility] and includes sequencing and deletion/duplication analysis for the following 8 genes: ATM, BRCA1, BRCA2, CDH1, CHEK2, PALB2, PTEN, and TP53. The CancerNext-Expanded + RNAinsight gene panel offered by [facility] and includes sequencing and rearrangement analysis for the following 77 genes: AIP, ALK, APC, ATM, AXIN2, BAP1, BARD1, BLM, BMPR1A, BRCA1, BRCA2, BRIP1, CDC73, CDH1, CDK4, CDKN1B, CDKN2A, CHEK2, CTNNA1, DICER1, FANCC, FH, FLCN, GALNT12, KIF1B, LZTR1, MAX, MEN1, MET, MLH1, MSH2, MSH3, MSH6, MUTYH, NBN, NF1, NF2, NTHL1, PALB2, PHOX2B, PMS2, POT1, PRKAR1A, PTCH1, PTEN, RAD51C, RAD51D, RB1, RECQL, RET, SDHA, SDHAF2, SDHB, SDHC, SDHD, SMAD4, SMARCA4, SMARCB1, SMARCE1, STK11, SUFU, TMEM127, TP53, TSC1, TSC2, VHL and XRCC2 (sequencing and deletion/duplication); EGFR, EGLN1, HOXB13, KIT, MITF, PDGFRA, POLD1 and POLE (sequencing only); EPCAM and GREM1 (deletion/duplication only). RNA data is routinely analyzed for use in variant interpretation for all genes.     FAMILY HISTORY:  We obtained a detailed, 4-generation family history.  Significant diagnoses are listed below: Family History  Problem Relation Age of Onset  . Other Mother        B-12 deficiency  . Breast cancer Mother 60  . Melanoma Mother        dx 20s (foot) and 60s (arm)  . Myelodysplastic syndrome Mother        dx mid-70s  . Osteoporosis Mother   . Other Sister        B-12 deficiency  . Breast cancer Sister 38       "inconclusive" genetic testing  . Osteoporosis Sister   .   Alcoholism Father   . Lung cancer Maternal Uncle        radiation exposure   . Cancer Nephew 23       Ewing's sarcoma  . Breast cancer Cousin 43       (maternal first cousin)  .  Breast cancer Cousin        dx 50s/60s (maternal first cousin)  . Cancer Paternal Aunt        unknown type, dx >50  . Cancer Paternal Aunt        unknown type, dx >50   Ashley Rasmussen has four daughters (ages 3-50). One daughter Earnest Bailey) had negative BRCA1 and BRCA2 testing. She has one brother (age 12) and two sisters (ages 23 and 58). Her oldest sister has a history of breast cancer diagnosed at age 64 and had genetic testing that was inconclusive (report not available for review). This sister's son died from [place] at age 64 (diagnosed age 20).  Ashley Rasmussen mother died at age 58 and had a history of breast cancer (diagnosed at age 20), melanoma (diagnosed on her foot in her 44s, and on her arm in her 82s), and a blood disorder. There were nine maternal aunts and four maternal uncles. One uncle had lung cancer and a history of radiation exposure during the cleanup of a nuclear bomb in [place]. Two maternal cousins had breast cancer - one diagnosed in her late 44s, and one diagnosed in her late 56s or early 56s. Ashley Rasmussen maternal grandmother died at age 22 without cancer. Her maternal grandfather died in his 62s without cancer.   Ashley Rasmussen father died at age 73 without cancer. There were eight paternal aunts/uncles (unsure how many aunts vs uncles). Two aunts had cancer diagnosed older than 86 (Ashley Rasmussen does not know the type of cancer). There is no known cancer among paternal cousins. Ashley Rasmussen paternal grandparents died older than 72 without cancer.   Ashley Rasmussen is aware of previous family history of genetic testing for hereditary cancer risks. Patient's maternal and paternal ancestors are of English/British descent. There is no reported Ashkenazi Jewish ancestry. There is no known consanguinity.  GENETIC TEST RESULTS: Genetic testing reported out on 10/23/2020 through the [facility] panel and 10/30/2020 through the [facility] + RNAinsight panel. No pathogenic variants  were detected.   The BRCAplus panel offered by [facility] and includes sequencing and deletion/duplication analysis for the following 8 genes: ATM, BRCA1, BRCA2, CDH1, CHEK2, PALB2, PTEN, and TP53. The CancerNext-Expanded + RNAinsight gene panel offered by [facility] and includes sequencing and rearrangement analysis for the following 77 genes: AIP, ALK, APC, ATM, AXIN2, BAP1, BARD1, BLM, BMPR1A, BRCA1, BRCA2, BRIP1, CDC73, CDH1, CDK4, CDKN1B, CDKN2A, CHEK2, CTNNA1, DICER1, FANCC, FH, FLCN, GALNT12, KIF1B, LZTR1, MAX, MEN1, MET, MLH1, MSH2, MSH3, MSH6, MUTYH, NBN, NF1, NF2, NTHL1, PALB2, PHOX2B, PMS2, POT1, PRKAR1A, PTCH1, PTEN, RAD51C, RAD51D, RB1, RECQL, RET, SDHA, SDHAF2, SDHB, SDHC, SDHD, SMAD4, SMARCA4, SMARCB1, SMARCE1, STK11, SUFU, TMEM127, TP53, TSC1, TSC2, VHL and XRCC2 (sequencing and deletion/duplication); EGFR, EGLN1, HOXB13, KIT, MITF, PDGFRA, POLD1 and POLE (sequencing only); EPCAM and GREM1 (deletion/duplication only). RNA data is routinely analyzed for use in variant interpretation for all genes. The test report will be scanned into EPIC and located under the Molecular Pathology section of the Results Review tab.  A portion of the result report is included below for reference.     We discussed with Ms. Samara that because current genetic testing is not perfect, it  is possible there may be a gene mutation in one of these genes that current testing cannot detect, but that chance is small.  We also discussed that there could be another gene that has not yet been discovered, or that we have not yet tested, that is responsible for the cancer diagnoses in the family. It is also possible there is a hereditary cause for the cancer in the family that Ms. Slimp did not inherit and therefore was not identified in her testing.  Therefore, it is important to remain in touch with cancer genetics in the future so that we can continue to offer Ms. Renaldo the most up to date genetic testing.    ADDITIONAL GENETIC TESTING: We discussed with Ms. Wirsing that her genetic testing was fairly extensive.  If there are genes identified to increase cancer risk that can be analyzed in the future, we would be happy to discuss and coordinate this testing at that time.    CANCER SCREENING RECOMMENDATIONS: Ms. Sabbagh's test result is considered negative (normal).  This means that we have not identified a hereditary cause for her personal and family history of cancer at this time. While reassuring, this does not definitively rule out a hereditary predisposition to cancer. It is still possible that there could be genetic mutations that are undetectable by current technology. There could be genetic mutations in genes that have not been tested or identified to increase cancer risk. Therefore, it is recommended she continue to follow the cancer management and screening guidelines provided by her oncology and primary healthcare provider.   An individual's cancer risk and medical management are not determined by genetic test results alone. Overall cancer risk assessment incorporates additional factors, including personal medical history, family history, and any available genetic information that may result in a personalized plan for cancer prevention and surveillance.  RECOMMENDATIONS FOR FAMILY MEMBERS:  Individuals in this family might be at some increased risk of developing cancer, over the general population risk, simply due to the family history of cancer. We recommended women in this family have a yearly mammogram beginning at age 40, or 10 years younger than the earliest onset of cancer, an annual clinical breast exam, and perform monthly breast self-exams. Women in this family should also have a gynecological exam as recommended by their primary provider. All family members should be referred for colonoscopy starting at age 45.  It is also possible there is a hereditary cause for the cancer in Ms. Forcier's  family that she did not inherit and therefore was not identified in her.  Based on Ms. Rieman's family history, we recommended her maternal first cousins who had breast cancer have genetic counseling and testing. Ms. Bartles is welcome to let us know if we can be of any assistance in coordinating genetic counseling and/or testing for these family members.   FOLLOW-UP: Lastly, we discussed with Ms. Knudtson that cancer genetics is a rapidly advancing field and it is possible that new genetic tests will be appropriate for her and/or her family members in the future. We encouraged her to remain in contact with cancer genetics on an annual basis so we can update her personal and family histories and let her know of advances in cancer genetics that may benefit this family.   Our contact number was provided. Ms. Fettig's questions were answered to her satisfaction, and she knows she is welcome to call us at anytime with additional questions or concerns.   Emily Stiglich, MS, LCGC Genetic Counselor   [email or web address] Phone: [phone]  

## 2020-11-04 NOTE — Telephone Encounter (Signed)
Revealed negative genetic testing through the [facility] CancerNext-Expanded + RNAinsight panel.

## 2020-11-05 ENCOUNTER — Encounter: Payer: Self-pay | Admitting: *Deleted

## 2020-11-21 ENCOUNTER — Inpatient Hospital Stay: Payer: PPO | Attending: Hematology | Admitting: Hematology

## 2020-11-21 ENCOUNTER — Other Ambulatory Visit: Payer: Self-pay

## 2020-11-21 ENCOUNTER — Encounter: Payer: Self-pay | Admitting: Hematology

## 2020-11-21 VITALS — BP 158/98 | HR 111 | Temp 99.9°F | Resp 20 | Ht 66.0 in | Wt 186.6 lb

## 2020-11-21 DIAGNOSIS — D0511 Intraductal carcinoma in situ of right breast: Secondary | ICD-10-CM | POA: Diagnosis not present

## 2020-11-21 DIAGNOSIS — Z808 Family history of malignant neoplasm of other organs or systems: Secondary | ICD-10-CM | POA: Insufficient documentation

## 2020-11-21 DIAGNOSIS — R739 Hyperglycemia, unspecified: Secondary | ICD-10-CM | POA: Insufficient documentation

## 2020-11-21 DIAGNOSIS — M818 Other osteoporosis without current pathological fracture: Secondary | ICD-10-CM | POA: Diagnosis not present

## 2020-11-21 DIAGNOSIS — Z9011 Acquired absence of right breast and nipple: Secondary | ICD-10-CM | POA: Diagnosis not present

## 2020-11-21 DIAGNOSIS — Z86000 Personal history of in-situ neoplasm of breast: Secondary | ICD-10-CM | POA: Diagnosis not present

## 2020-11-21 DIAGNOSIS — Z803 Family history of malignant neoplasm of breast: Secondary | ICD-10-CM | POA: Diagnosis not present

## 2020-11-21 NOTE — Progress Notes (Signed)
Ashley Rasmussen   [phone] [phone]   Clinic Follow up Note   Patient Care Team: Tower, [name], MD as PCP - General [name], RN as Oncology Nurse Navigator Rockwell Germany, RN as Oncology Nurse Navigator [name], MD as Consulting Physician (General Surgery) Truitt Merle, MD as Consulting Physician (Hematology) Kyung Rudd, MD as Consulting Physician (Radiation Oncology)  Date of Service:  11/21/2020  CHIEF COMPLAINT: f/u of right breast DCIS  SUMMARY OF ONCOLOGIC HISTORY: Oncology History Overview Note  Cancer Staging Ductal carcinoma in situ (DCIS) of right breast Staging form: Breast, AJCC 8th Edition - Clinical stage from 10/09/2020: Stage 0 (cTis (DCIS), cN0, cM0, G3, ER+, PR+, HER2-) - Signed by Truitt Merle, MD on 10/15/2020 Stage prefix: Initial diagnosis Histologic grading system: 3 grade system    Ductal carcinoma in situ (DCIS) of right breast  10/02/2020 Mammogram   IMPRESSION: Indeterminate calcifications in the right breast spanning 0.6 cm in the upper slightly outer right breast   10/09/2020 Cancer Staging   Staging form: Breast, AJCC 8th Edition - Clinical stage from 10/09/2020: Stage 0 (cTis (DCIS), cN0, cM0, G3, ER+, PR+, HER2-) - Signed by Truitt Merle, MD on 10/15/2020 Stage prefix: Initial diagnosis Histologic grading system: 3 grade system   10/09/2020 Initial Biopsy   Diagnosis Breast, right, needle core biopsy, right breast, uoq - DUCTAL CARCINOMA IN SITU WITH NECROSIS AND CALCIFICATIONS. - SEE MICROSCOPIC DESCRIPTION. Microscopic Comment The DCIS has intermediate to high nuclear grade. Estrogen and progesterone receptors will be performed.    Results: IMMUNOHISTOCHEMICAL AND MORPHOMETRIC ANALYSIS PERFORMED MANUALLY Estrogen Receptor: 95%, POSITIVE, STRONG STAINING INTENSITY Progesterone Receptor: 40%, POSITIVE, STRONG STAINING INTENSITY   10/10/2020 Initial Diagnosis   Ductal carcinoma in situ (DCIS) of  right breast   10/23/2020 Genetic Testing   Negative genetic testing:  No pathogenic variants detected on the [facility] BRCAplus panel or CancerNext-Expanded + RNAinsight panel. The report dates are 10/23/2020 and 10/30/2020, respectively.   The BRCAplus panel offered by [facility] and includes sequencing and deletion/duplication analysis for the following 8 genes: ATM, BRCA1, BRCA2, CDH1, CHEK2, PALB2, PTEN, and TP53. The CancerNext-Expanded + RNAinsight gene panel offered by [facility] and includes sequencing and rearrangement analysis for the following 77 genes: AIP, ALK, APC, ATM, AXIN2, BAP1, BARD1, BLM, BMPR1A, BRCA1, BRCA2, BRIP1, CDC73, CDH1, CDK4, CDKN1B, CDKN2A, CHEK2, CTNNA1, DICER1, FANCC, FH, FLCN, GALNT12, KIF1B, LZTR1, MAX, MEN1, MET, MLH1, MSH2, MSH3, MSH6, MUTYH, NBN, NF1, NF2, NTHL1, PALB2, PHOX2B, PMS2, POT1, PRKAR1A, PTCH1, PTEN, RAD51C, RAD51D, RB1, RECQL, RET, SDHA, SDHAF2, SDHB, SDHC, SDHD, SMAD4, SMARCA4, SMARCB1, SMARCE1, STK11, SUFU, TMEM127, TP53, TSC1, TSC2, VHL and XRCC2 (sequencing and deletion/duplication); EGFR, EGLN1, HOXB13, KIT, MITF, PDGFRA, POLD1 and POLE (sequencing only); EPCAM and GREM1 (deletion/duplication only). RNA data is routinely analyzed for use in variant interpretation for all genes.   10/30/2020 Surgery   A. BREAST, RIGHT, MASTECTOMY:  - Ductal carcinoma in situ with necrosis and calcifications, two foci (3.2 and 0.9 cm).  - Margins not involved.  - Atypical lobular hyperplasia.  - Biopsy sites and biopsy clip.       CURRENT THERAPY:  Surveillance  INTERVAL HISTORY:  Ashley Rasmussen is here for a follow up of DCIS. She was last seen by me on 10/16/20 in the multidisciplinary breast cancer clinic. She presents to the clinic accompanied by her husband. She states she is feeling very good. She recently had a virus, so her visit with Dr. Marlou Starks was  delayed. She notes she saw him yesterday, and the drain was removed. She notes she is planning for left  mastectomy, as well. She discussed this with Dr. Marlou Starks, who is supportive of whatever she wants to do. She is, however, also planning to undergo mastoid bone surgery in the near future. This will take priority.  All other systems were reviewed with the patient and are negative.  MEDICAL HISTORY:  Past Medical History:  Diagnosis Date  . Allergic rhinitis, cause unspecified   . Breast cancer ([place]) 1992  . Complication of anesthesia   . Family history of breast cancer   . Family history of lung cancer   . HLD (hyperlipidemia)   . Osteopenia   . Osteoporosis, unspecified   . Personal history of chemotherapy   . Personal history of malignant neoplasm of breast    needs yearly mammogram and MRI every 2  . Personal history of radiation therapy   . PONV (postoperative nausea and vomiting)   . Thoracic or lumbosacral neuritis or radiculitis, unspecified     SURGICAL HISTORY: Past Surgical History:  Procedure Laterality Date  . BREAST BIOPSY Left 03/2015  . Breast cancer excision    . BREAST LUMPECTOMY Right   . COLONOSCOPY WITH PROPOFOL N/A 04/11/2018   Procedure: COLONOSCOPY WITH PROPOFOL;  Surgeon: Jonathon Bellows, MD;  [facility] ENDOSCOPY;  Service: Gastroenterology;  Laterality: N/A;  . LATISSIMUS FLAP TO BREAST     due to radiation burn  . TOTAL MASTECTOMY Right 10/30/2020   Procedure: RIGHT MASTECTOMY;  Surgeon: [name], MD;  Location: [facility];  Service: General;  Laterality: Right;    I have reviewed the social history and family history with the patient and they are unchanged from previous note.  ALLERGIES:  is allergic to crestor [rosuvastatin], alendronate sodium, nsaids, pravachol, raloxifene, and simvastatin.  MEDICATIONS:  Current Outpatient Medications  Medication Sig Dispense Refill  . acetaminophen (TYLENOL) 500 MG tablet Take 500 mg by mouth every 6 (six) hours as needed.    Marland Kitchen HYDROcodone-acetaminophen (NORCO/VICODIN) 5-325 MG tablet Take  1-2 tablets by mouth every 6 (six) hours as needed for moderate pain or severe pain. 15 tablet 0  . methocarbamol (ROBAXIN) 500 MG tablet Take 1 tablet (500 mg total) by mouth every 6 (six) hours as needed for muscle spasms. 20 tablet 1  . Multiple Vitamin (MULTIVITAMIN) tablet Take 3 tablets by mouth daily.    Marland Kitchen omeprazole (PRILOSEC) 20 MG capsule Take 1 capsule (20 mg total) by mouth daily. 90 capsule 1   No current facility-administered medications for this visit.    PHYSICAL EXAMINATION: ECOG PERFORMANCE STATUS: 0 - Asymptomatic  Vitals:   11/21/20 1058  BP: (!) 158/98  Pulse: (!) 111  Resp: 20  Temp: 99.9 F (37.7 C)  SpO2: 97%   Filed Weights   11/21/20 1058  Weight: 186 lb 9.6 oz (84.6 kg)    GENERAL:alert, no distress and comfortable SKIN: skin color, texture, turgor are normal, no rashes or significant lesions EYES: normal, Conjunctiva are pink and non-injected, sclera clear  NECK: supple, thyroid normal size, non-tender, without nodularity LYMPH:  no palpable lymphadenopathy in the cervical, axillary  LUNGS: clear to auscultation and percussion with normal breathing effort HEART: regular rate & rhythm and no murmurs and no lower extremity edema ABDOMEN:abdomen soft, non-tender and normal bowel sounds Musculoskeletal:no cyanosis of digits and no clubbing  NEURO: alert & oriented x 3 with fluent speech, no focal motor/sensory deficits Breast: incision  healing well; tissue from prior latissimus flap palpable  LABORATORY DATA:  I have reviewed the data as listed CBC Latest Ref Rng & Units 10/16/2020 09/25/2020 06/19/2019  WBC 4.0 - 10.5 K/uL 6.6 6.1 6.8  Hemoglobin 12.0 - 15.0 g/dL 14.3 14.4 14.0  Hematocrit 36.0 - 46.0 % 43.2 43.1 42.4  Platelets 150 - 400 K/uL 246 247.0 255.0     CMP Latest Ref Rng & Units 10/16/2020 09/25/2020 06/19/2019  Glucose 70 - 99 mg/dL 124(H) 109(H) 110(H)  BUN 8 - 23 mg/dL _0 Creatinine 0.44 - 1.00 mg/dL 0.91 0.81 0.88  Sodium 135  - 145 mmol/L 144 141 141  Potassium 3.5 - 5.1 mmol/L 3.8 4.1 4.4  Chloride 98 - 111 mmol/L 109 107 106  CO2 22 - 32 mmol/L _1 Calcium 8.9 - 10.3 mg/dL 9.5 9.8 9.6  Total Protein 6.5 - 8.1 g/dL 6.9 6.3 6.3  Total Bilirubin 0.3 - 1.2 mg/dL 0.6 0.6 0.5  Alkaline Phos 38 - 126 U/L 102 101 106  AST 15 - 41 U/L 14(L) 11 13  ALT 0 - 44 U/L _2 RADIOGRAPHIC STUDIES: I have personally reviewed the radiological images as listed and agreed with the findings in the report. No results found.   ASSESSMENT & PLAN:  Ashley Rasmussen is a 70 y.o. female with   1. Right breast DCIS, grade III, ER+/PR+  -She underwent right mastectomy on 10/30/20 under Dr. Marlou Starks. Pathology showed two foci (3.2 cm and 0.9 cm) of DCIS. Margins negative. -Her DCIS has been cured by complete surgical resection. Any form of adjuvant therapy is preventive for future breast cancer.  -She notes she is planning for prophylactic left mastectomy. In that case, her risk of future breast cancer is <5%, she would not need adjuvant therapy. No need for mammogram either  -f/u as needed   2. H/o Right breast cancer in 1992 -She was treated in [place] and [place] -S/p lumpectomy with 22 negative LN removed, 6 months of chemo and Radiation and 1 year for Tamoxifen (did not tolerate well with depression) -She notes she had latissimus flap surgery due to breast damage form RT.   3. Genetic testing  -Her mother and sister had breast cancer and her sister also had melanoma. Given family history and her history of breast cancer, she is eligible for genetic testing. She is agreeable.  -Performed at clinic on 10/16/20, results were negative  4. Hyperglycemia  -She will continue workup and management with her PCP.   5. Osteoporosis -Her 2009 DEXA showed lowest T-score at -2.4 at left femur.  -f/u with PCP    PLAN:  -F/u PRN -F/u with Dr. Marlou Starks as scheduled    No problem-specific Assessment & Plan notes found  for this encounter.   No orders of the defined types were placed in this encounter.  All questions were answered. The patient knows to call the clinic with any problems, questions or concerns. No barriers to learning was detected. The total time spent in the appointment was 25 minutes.     Truitt Merle, MD 11/21/2020   I, Wilburn Mylar, am acting as scribe for Truitt Merle, MD.   I have reviewed the above documentation for accuracy and completeness, and I agree with the above.

## 2020-11-25 ENCOUNTER — Encounter: Payer: Self-pay | Admitting: *Deleted

## 2020-11-25 DIAGNOSIS — H7192 Unspecified cholesteatoma, left ear: Secondary | ICD-10-CM | POA: Diagnosis not present

## 2020-11-25 DIAGNOSIS — H90A32 Mixed conductive and sensorineural hearing loss, unilateral, left ear with restricted hearing on the contralateral side: Secondary | ICD-10-CM | POA: Diagnosis not present

## 2020-11-25 DIAGNOSIS — H9012 Conductive hearing loss, unilateral, left ear, with unrestricted hearing on the contralateral side: Secondary | ICD-10-CM | POA: Diagnosis not present

## 2020-11-25 DIAGNOSIS — H9041 Sensorineural hearing loss, unilateral, right ear, with unrestricted hearing on the contralateral side: Secondary | ICD-10-CM | POA: Diagnosis not present

## 2020-12-12 ENCOUNTER — Encounter: Payer: Self-pay | Admitting: Family Medicine

## 2020-12-13 MED ORDER — OMEPRAZOLE 20 MG PO CPDR: 20.0000 mg | DELAYED_RELEASE_CAPSULE | Freq: Every day | ORAL | 1 refills | Status: DC

## 2021-02-27 ENCOUNTER — Encounter: Payer: Self-pay | Admitting: Primary Care

## 2021-02-27 ENCOUNTER — Ambulatory Visit (INDEPENDENT_AMBULATORY_CARE_PROVIDER_SITE_OTHER): Payer: Medicare Other | Admitting: Primary Care

## 2021-02-27 ENCOUNTER — Other Ambulatory Visit: Payer: Self-pay

## 2021-02-27 VITALS — BP 140/88 | HR 119 | Temp 98.1°F | Ht 66.0 in | Wt 189.0 lb

## 2021-02-27 DIAGNOSIS — N898 Other specified noninflammatory disorders of vagina: Secondary | ICD-10-CM

## 2021-02-27 DIAGNOSIS — R350 Frequency of micturition: Secondary | ICD-10-CM | POA: Diagnosis not present

## 2021-02-27 DIAGNOSIS — N3000 Acute cystitis without hematuria: Secondary | ICD-10-CM | POA: Insufficient documentation

## 2021-02-27 LAB — POC URINALSYSI DIPSTICK (AUTOMATED)
Bilirubin, UA: NEGATIVE
Blood, UA: NEGATIVE
Glucose, UA: NEGATIVE
Leukocytes, UA: NEGATIVE
Nitrite, UA: NEGATIVE
Protein, UA: POSITIVE — AB
Spec Grav, UA: 1.025 (ref 1.010–1.025)
Urobilinogen, UA: 0.2 E.U./dL
pH, UA: 6 (ref 5.0–8.0)

## 2021-02-27 NOTE — Progress Notes (Signed)
Subjective:    Patient ID: Ashley Rasmussen, female    DOB: 1950-09-01, 70 y.o.   MRN: [id number]  HPI  Ashley Rasmussen is a very pleasant 70 y.o. female patient of Dr. Glori Bickers with a history of uterine prolapse, vaginal atrophy, breast cancer, who presents today to discuss urinary frequency.  Symptoms began two weeks ago with urinary frequency. Her vaginal itching began about one year ago, has been intermittent.   She's tried Monistat about one month ago, has been using PRN "anti-itch" cream OTC with temporary improvement.   She was provided a prescription for ketoconazole cream for vaginal itching in March 2022 which was ineffective. Her vaginal itching is typically external, not internally itchy.   She denies hematuria, vaginal discharge, abdominal pain, flank pain. Overall symptoms have improved. She's not taken any AZO. She drinks plenty of water during the day.     Review of Systems  Gastrointestinal:  Negative for abdominal pain.  Genitourinary:  Positive for frequency. Negative for dysuria, pelvic pain and vaginal discharge.        Past Medical History:  Diagnosis Date   Allergic rhinitis, cause unspecified    Breast cancer ([place]) 0000000   Complication of anesthesia    Family history of breast cancer    Family history of lung cancer    HLD (hyperlipidemia)    Osteopenia    Osteoporosis, unspecified    Personal history of chemotherapy    Personal history of malignant neoplasm of breast    needs yearly mammogram and MRI every 2   Personal history of radiation therapy    PONV (postoperative nausea and vomiting)    Thoracic or lumbosacral neuritis or radiculitis, unspecified     Social History   Socioeconomic History   Marital status: Married    Spouse name: Not on file   Number of children: 4   Years of education: Not on file   Highest education level: Not on file  Occupational History   Not on file  Tobacco Use   Smoking status: Former    Years: 2.00    Types:  Cigarettes    Quit date: 07/27/1989    Years since quitting: 31.6   Smokeless tobacco: Never   Tobacco comments:    quit over 3yr ago  Vaping Use   Vaping Use: Never used  Substance and Sexual Activity   Alcohol use: No    Alcohol/week: 0.0 standard drinks   Drug use: No   Sexual activity: Never  Other Topics Concern   Not on file  Social History Narrative   Married      4 children         Social Determinants of Health   Financial Resource Strain: Not on file  Food Insecurity: No Food Insecurity   Worried About RCharity fundraiserin the Last Year: Never true   Ran Out of Food in the Last Year: Never true  Transportation Needs: No Transportation Needs   Lack of Transportation (Medical): No   Lack of Transportation (Non-Medical): No  Physical Activity: Not on file  Stress: Not on file  Social Connections: Not on file  Intimate Partner Violence: Not on file    Past Surgical History:  Procedure Laterality Date   BREAST BIOPSY Left 03/2015   Breast cancer excision     BREAST LUMPECTOMY Right    COLONOSCOPY WITH PROPOFOL N/A 04/11/2018   Procedure: COLONOSCOPY WITH PROPOFOL;  Surgeon: AJonathon Bellows MD;  [facility];  Service: Gastroenterology;  Laterality: N/A;   LATISSIMUS FLAP TO BREAST     due to radiation burn   TOTAL MASTECTOMY Right 10/30/2020   Procedure: RIGHT MASTECTOMY;  Surgeon: [name], MD;  Location: [place];  Service: General;  Laterality: Right;    Family History  Problem Relation Age of Onset   Other Mother        B-12 deficiency   Breast cancer Mother 87   Melanoma Mother        dx 56s (foot) and 32s (arm)   Myelodysplastic syndrome Mother        dx mid-70s   Osteoporosis Mother    Other Sister        B-12 deficiency   Breast cancer Sister 29       "inconclusive" genetic testing   Osteoporosis Sister    Alcoholism Father    Lung cancer Maternal Uncle        radiation exposure    Cancer Nephew 23        Ewing's sarcoma   Breast cancer Cousin 101       (maternal first cousin)   Breast cancer Cousin        dx 50s/60s (maternal first cousin)   Cancer Paternal Aunt        unknown type, dx >50   Cancer Paternal Aunt        unknown type, dx >50    Allergies  Allergen Reactions   Crestor [Rosuvastatin]     Myalgia  Even once weekly dose   Alendronate Sodium Other (See Comments)    REACTION: GI side effects   Nsaids Other (See Comments)    GI upset with oral nsaids   Pravachol Other (See Comments)    myalgias   Raloxifene Other (See Comments)    REACTION: breast tenderness and leg pain   Simvastatin Other (See Comments)    REACTION: myalgias    Current Outpatient Medications on File Prior to Visit  Medication Sig Dispense Refill   acetaminophen (TYLENOL) 500 MG tablet Take 500 mg by mouth every 6 (six) hours as needed.     Multiple Vitamin (MULTIVITAMIN) tablet Take 3 tablets by mouth daily.     omeprazole (PRILOSEC) 20 MG capsule Take 1 capsule (20 mg total) by mouth daily. 90 capsule 1   famotidine (PEPCID) 40 MG tablet Take by mouth. (Patient not taking: Reported on 02/27/2021)     HYDROcodone-acetaminophen (NORCO/VICODIN) 5-325 MG tablet Take 1-2 tablets by mouth every 6 (six) hours as needed for moderate pain or severe pain. (Patient not taking: Reported on 02/27/2021) 15 tablet 0   methocarbamol (ROBAXIN) 500 MG tablet Take 1 tablet (500 mg total) by mouth every 6 (six) hours as needed for muscle spasms. (Patient not taking: Reported on 02/27/2021) 20 tablet 1   No current facility-administered medications on file prior to visit.    BP 140/88   Pulse (!) 119   Temp 98.1 F (36.7 C) (Temporal)   Ht '5\' 6"'$  (1.676 m)   Wt 189 lb (85.7 kg)   SpO2 100%   BMI 30.51 kg/m  Objective:   Physical Exam Cardiovascular:     Rate and Rhythm: Normal rate and regular rhythm.  Pulmonary:     Effort: Pulmonary effort is normal.     Breath sounds: Normal breath sounds.  Abdominal:      Tenderness: There is no right CVA tenderness or left CVA tenderness.  Musculoskeletal:     Cervical  back: Neck supple.  Skin:    General: Skin is warm and dry.          Assessment & Plan:      This visit occurred during the SARS-CoV-2 public health emergency.  Safety protocols were in place, including screening questions prior to the visit, additional usage of staff PPE, and extensive cleaning of exam room while observing appropriate contact time as indicated for disinfecting solutions.

## 2021-02-27 NOTE — Assessment & Plan Note (Signed)
Acute for 2 weeks, improving now.  UA today negative for leuks, nitrites, blood. Culture sent.

## 2021-02-27 NOTE — Assessment & Plan Note (Signed)
Chronic, ongoing, no improvement with ketoconazole cream. Suspect vaginal atrophy contributing, but given breast cancer history would be weary of topical estrogen.   Question whether RX topical steroid used PRN would be more effective as her OTC "anti-itch" cream is somewhat effective.  Will consult with PCP.

## 2021-02-27 NOTE — Patient Instructions (Signed)
We will be in touch regarding your final urine test.  Please reach out to Dr. Glori Bickers regarding your ongoing itching.  It was a pleasure meeting you!

## 2021-02-28 LAB — URINE CULTURE
MICRO NUMBER:: 12202141
SPECIMEN QUALITY:: ADEQUATE

## 2021-03-25 DIAGNOSIS — M5410 Radiculopathy, site unspecified: Secondary | ICD-10-CM | POA: Diagnosis not present

## 2021-03-25 DIAGNOSIS — E785 Hyperlipidemia, unspecified: Secondary | ICD-10-CM | POA: Diagnosis not present

## 2021-03-25 DIAGNOSIS — H7192 Unspecified cholesteatoma, left ear: Secondary | ICD-10-CM | POA: Diagnosis not present

## 2021-03-25 DIAGNOSIS — Z87891 Personal history of nicotine dependence: Secondary | ICD-10-CM | POA: Diagnosis not present

## 2021-03-25 DIAGNOSIS — J309 Allergic rhinitis, unspecified: Secondary | ICD-10-CM | POA: Diagnosis not present

## 2021-03-25 DIAGNOSIS — K219 Gastro-esophageal reflux disease without esophagitis: Secondary | ICD-10-CM | POA: Diagnosis not present

## 2021-03-25 DIAGNOSIS — H7122 Cholesteatoma of mastoid, left ear: Secondary | ICD-10-CM | POA: Diagnosis not present

## 2021-03-25 DIAGNOSIS — H9012 Conductive hearing loss, unilateral, left ear, with unrestricted hearing on the contralateral side: Secondary | ICD-10-CM | POA: Diagnosis not present

## 2021-03-25 DIAGNOSIS — Z853 Personal history of malignant neoplasm of breast: Secondary | ICD-10-CM | POA: Diagnosis not present

## 2021-06-23 DIAGNOSIS — H7192 Unspecified cholesteatoma, left ear: Secondary | ICD-10-CM | POA: Diagnosis not present

## 2021-07-31 ENCOUNTER — Other Ambulatory Visit: Payer: Self-pay | Admitting: Family Medicine

## 2021-09-03 ENCOUNTER — Encounter: Payer: Self-pay | Admitting: Family Medicine

## 2021-09-03 DIAGNOSIS — Z20822 Contact with and (suspected) exposure to covid-19: Secondary | ICD-10-CM | POA: Diagnosis not present

## 2021-09-03 DIAGNOSIS — U071 COVID-19: Secondary | ICD-10-CM | POA: Diagnosis not present

## 2021-09-04 ENCOUNTER — Telehealth (INDEPENDENT_AMBULATORY_CARE_PROVIDER_SITE_OTHER): Payer: Medicare Other | Admitting: Family Medicine

## 2021-09-04 ENCOUNTER — Encounter: Payer: Self-pay | Admitting: Family Medicine

## 2021-09-04 VITALS — Wt 175.0 lb

## 2021-09-04 DIAGNOSIS — U071 COVID-19: Secondary | ICD-10-CM | POA: Diagnosis not present

## 2021-09-04 DIAGNOSIS — B3731 Acute candidiasis of vulva and vagina: Secondary | ICD-10-CM

## 2021-09-04 MED ORDER — NIRMATRELVIR/RITONAVIR (PAXLOVID)TABLET: 3.0000 | ORAL_TABLET | Freq: Two times a day (BID) | ORAL | 0 refills | Status: AC

## 2021-09-04 MED ORDER — FLUCONAZOLE 150 MG PO TABS: 150.0000 mg | ORAL_TABLET | Freq: Once | ORAL | 0 refills | Status: AC

## 2021-09-04 MED ORDER — BENZONATATE 100 MG PO CAPS: 100.0000 mg | ORAL_CAPSULE | Freq: Three times a day (TID) | ORAL | 0 refills | Status: DC | PRN

## 2021-09-04 NOTE — Telephone Encounter (Signed)
I spoke with Ashley Rasmussen; Ashley Rasmussen tested + covid PCR 09/03/21 at [facility]. Pts symptoms started on 09/01/21; ? Fever not taken temp; feels hot, body aches, no H/A. Ashley Rasmussen does have S/T that does hurt when swallows,runny nose, head congestion. Ashley Rasmussen has dry cough but no SOB. No vomiting or diarrhea now. Ashley Rasmussen does not have any taste or smell.Self quarantine, drink plenty of fluids, rest, and take Tylenol for fever. UC & ED precautions given and Ashley Rasmussen voiced understanding. Ashley Rasmussen scheduled appt VV with Dr Einar Pheasant 09/04/21 at 11;20. Sending note to Dr Einar Pheasant and Alyse Low and  Will teams [place].

## 2021-09-04 NOTE — Progress Notes (Signed)
I connected with Ashley Rasmussen on 09/04/21 at 11:20 AM EST by video and verified that I am speaking with the correct person using two identifiers.   I discussed the limitations, risks, security and privacy concerns of performing an evaluation and management service by video and the availability of in person appointments. I also discussed with the patient that there may be a patient responsible charge related to this service. The patient expressed understanding and agreed to proceed.  Patient location: Home Provider Location: [place] Participants: [name] and Ashley Rasmussen   Subjective:     Ashley Rasmussen is a 71 y.o. female presenting for Covid Positive (Onset of sx: 09/01/21), Generalized Body Aches, Nasal Congestion, and Cough (dry)     Cough   #Covid positive - 09/01/2021 symptoms started - started with headache and severe body aches - hx of covid previously - the next day was feeling slightly better - chills and sweats - body aches improving - no longer with HA  - mild fever - more congestion - left ear (hx of surgery on that side) - throat is sore - also sneezing a lot  - treatment: tylenol - has not tried cold or sinus medications   Review of Systems  Respiratory:  Positive for cough.     Social History   Tobacco Use  Smoking Status Former   Years: 2.00   Types: Cigarettes   Quit date: 07/27/1989   Years since quitting: 32.1  Smokeless Tobacco Never  Tobacco Comments   quit over 52yrs ago        Objective:   BP Readings from Last 3 Encounters:  02/27/21 140/88  11/21/20 (!) 158/98  10/30/20 139/75   Wt Readings from Last 3 Encounters:  09/04/21 175 lb (79.4 kg)  02/27/21 189 lb (85.7 kg)  11/21/20 186 lb 9.6 oz (84.6 kg)   Wt 175 lb (79.4 kg)    BMI 28.25 kg/m   Physical Exam Constitutional:      Appearance: Normal appearance. She is not ill-appearing.  HENT:     Head: Normocephalic and atraumatic.     Right Ear:  External ear normal.     Left Ear: External ear normal.  Eyes:     Conjunctiva/sclera: Conjunctivae normal.  Pulmonary:     Effort: Pulmonary effort is normal. No respiratory distress.  Neurological:     Mental Status: She is alert. Mental status is at baseline.  Psychiatric:        Mood and Affect: Mood normal.        Behavior: Behavior normal.        Thought Content: Thought content normal.        Judgment: Judgment normal.            Assessment & Plan:   Problem List Items Addressed This Visit   None Visit Diagnoses     Vaginal yeast infection    -  Primary   Relevant Medications   nirmatrelvir/ritonavir EUA (PAXLOVID) 20 x 150 MG & 10 x 100MG  TABS   fluconazole (DIFLUCAN) 150 MG tablet   COVID-19 virus infection       Relevant Medications   nirmatrelvir/ritonavir EUA (PAXLOVID) 20 x 150 MG & 10 x 100MG  TABS   benzonatate (TESSALON PERLES) 100 MG capsule   fluconazole (DIFLUCAN) 150 MG tablet      Patient is at increased risk for developing severe covid due to age and obesity. They are eligible for anti-viral  medication.   Discussed Paxlovid. Some improvement in symptoms today so discussed could start now or watch and wait. She will see how she feels and if no improvement will start paxlovid today.   Normal liver and kidney function.   Lab Results  Component Value Date   ALT 16 10/16/2020   AST 14 (L) 10/16/2020   ALKPHOS 102 10/16/2020   BILITOT 0.6 10/16/2020    Lab Results  Component Value Date   CREATININE 0.91 10/16/2020     Medication sent to pharmacy  Reviewed ER and return precautions  Reviewed isolation guidelines.    Return if symptoms worsen or fail to improve.  [name], MD

## 2021-09-04 NOTE — Telephone Encounter (Signed)
Sending to Triage to get more info.

## 2021-09-04 NOTE — Telephone Encounter (Signed)
See note from today

## 2021-09-17 ENCOUNTER — Ambulatory Visit (INDEPENDENT_AMBULATORY_CARE_PROVIDER_SITE_OTHER): Payer: Medicare Other | Admitting: Family

## 2021-09-17 ENCOUNTER — Other Ambulatory Visit: Payer: Self-pay

## 2021-09-17 VITALS — BP 134/86 | HR 64 | Ht 66.0 in | Wt 187.0 lb

## 2021-09-17 DIAGNOSIS — N949 Unspecified condition associated with female genital organs and menstrual cycle: Secondary | ICD-10-CM | POA: Diagnosis not present

## 2021-09-17 DIAGNOSIS — R300 Dysuria: Secondary | ICD-10-CM

## 2021-09-17 DIAGNOSIS — N898 Other specified noninflammatory disorders of vagina: Secondary | ICD-10-CM

## 2021-09-17 LAB — POCT URINALYSIS DIPSTICK
Bilirubin, UA: NEGATIVE
Blood, UA: NEGATIVE
Glucose, UA: NEGATIVE
Ketones, UA: NEGATIVE
Nitrite, UA: NEGATIVE
Protein, UA: NEGATIVE
Spec Grav, UA: 1.025 (ref 1.010–1.025)
Urobilinogen, UA: 0.2 E.U./dL
pH, UA: 5.5 (ref 5.0–8.0)

## 2021-09-17 NOTE — Patient Instructions (Signed)
°  Lets wait the result of the testing from the vaginal testing.  Pending the culture and the sample test.   It was a pleasure seeing you today! Please do not hesitate to reach out with any questions and or concerns.  Regards,   Eugenia Pancoast FNP-C

## 2021-09-17 NOTE — Progress Notes (Signed)
Established Patient Office Visit  Subjective:  Patient ID: Ashley Rasmussen, female    DOB: 1951/05/23  Age: 71 y.o. MRN: [id number]  CC:  Chief Complaint  Patient presents with   Vaginitis    Pt stated--burning, itching when urinating--2 months    HPI Ashley Rasmussen is here today with concerns. .    She thought she had a yeast infection, had video visit 2/9 and was given diflucan, states feels like it made it worse.    Symptoms have been for the last few months. She states she has been dealing with this for some time.  Vaginal itching in between the labias. Feels more inside the vagina. No recent gyn exam. No vaginal discharge. Vaginal lips are swollen and tender to the touch. Not having intercourse, lost interest.    Burning with urination over the last two days. No urinary frequency or urinary urgency. No lower abdominal pain. No fever/chills. No flank pain.   Past Medical History:  Diagnosis Date   Allergic rhinitis, cause unspecified    Breast cancer ([place]) 1740   Complication of anesthesia    Family history of breast cancer    Family history of lung cancer    HLD (hyperlipidemia)    Osteopenia    Osteoporosis, unspecified    Personal history of chemotherapy    Personal history of malignant neoplasm of breast    needs yearly mammogram and MRI every 2   Personal history of radiation therapy    PONV (postoperative nausea and vomiting)    Thoracic or lumbosacral neuritis or radiculitis, unspecified     Past Surgical History:  Procedure Laterality Date   BREAST BIOPSY Left 03/2015   Breast cancer excision     BREAST LUMPECTOMY Right    COLONOSCOPY WITH PROPOFOL N/A 04/11/2018   Procedure: COLONOSCOPY WITH PROPOFOL;  Surgeon: Jonathon Bellows, MD;  [facility];  Service: Gastroenterology;  Laterality: N/A;   LATISSIMUS FLAP TO BREAST     due to radiation burn   TOTAL MASTECTOMY Right 10/30/2020   Procedure: RIGHT MASTECTOMY;  Surgeon: [name], MD;   Location: [place];  Service: General;  Laterality: Right;    Family History  Problem Relation Age of Onset   Other Mother        B-12 deficiency   Breast cancer Mother 53   Melanoma Mother        dx 68s (foot) and 53s (arm)   Myelodysplastic syndrome Mother        dx mid-70s   Osteoporosis Mother    Other Sister        B-12 deficiency   Breast cancer Sister 10       "inconclusive" genetic testing   Osteoporosis Sister    Alcoholism Father    Lung cancer Maternal Uncle        radiation exposure    Cancer Nephew 23       Ewing's sarcoma   Breast cancer Cousin 80       (maternal first cousin)   Breast cancer Cousin        dx 50s/60s (maternal first cousin)   Cancer Paternal Aunt        unknown type, dx >50   Cancer Paternal Aunt        unknown type, dx >50    Social History   Socioeconomic History   Marital status: Married    Spouse name: Not on file   Number of children: 4  Years of education: Not on file   Highest education level: Not on file  Occupational History   Not on file  Tobacco Use   Smoking status: Former    Years: 2.00    Types: Cigarettes    Quit date: 07/27/1989    Years since quitting: 32.1   Smokeless tobacco: Never   Tobacco comments:    quit over 68yrs ago  Vaping Use   Vaping Use: Never used  Substance and Sexual Activity   Alcohol use: No    Alcohol/week: 0.0 standard drinks   Drug use: No   Sexual activity: Never  Other Topics Concern   Not on file  Social History Narrative   Married      4 children         Social Determinants of Health   Financial Resource Strain: Not on file  Food Insecurity: No Food Insecurity   Worried About Charity fundraiser in the Last Year: Never true   Ran Out of Food in the Last Year: Never true  Transportation Needs: No Transportation Needs   Lack of Transportation (Medical): No   Lack of Transportation (Non-Medical): No  Physical Activity: Not on file  Stress: Not on file   Social Connections: Not on file  Intimate Partner Violence: Not on file    Outpatient Medications Prior to Visit  Medication Sig Dispense Refill   acetaminophen (TYLENOL) 500 MG tablet Take 500 mg by mouth every 6 (six) hours as needed.     benzonatate (TESSALON PERLES) 100 MG capsule Take 1 capsule (100 mg total) by mouth 3 (three) times daily as needed. 20 capsule 0   Multiple Vitamin (MULTIVITAMIN) tablet Take 3 tablets by mouth daily.     omeprazole (PRILOSEC) 20 MG capsule TAKE 1 CAPSULE DAILY (Patient taking differently: Take 20 mg by mouth as needed.) 90 capsule 0   No facility-administered medications prior to visit.    Allergies  Allergen Reactions   Crestor [Rosuvastatin]     Myalgia  Even once weekly dose   Alendronate Sodium Other (See Comments)    REACTION: GI side effects   Nsaids Other (See Comments)    GI upset with oral nsaids   Pravachol Other (See Comments)    myalgias   Raloxifene Other (See Comments)    REACTION: breast tenderness and leg pain   Simvastatin Other (See Comments)    REACTION: myalgias    ROS Review of Systems  Constitutional:  Negative for chills and fever.  Gastrointestinal:  Negative for abdominal pain.  Genitourinary:  Positive for dysuria and vaginal pain (burning with itching as well, swelling of labia with redness). Negative for difficulty urinating, flank pain, frequency, hematuria, pelvic pain, urgency, vaginal bleeding and vaginal discharge.  Musculoskeletal:  Positive for arthralgias.     Objective:    Physical Exam Constitutional:      General: She is not in acute distress.    Appearance: Normal appearance. She is well-developed, well-groomed and normal weight. She is not ill-appearing, toxic-appearing or diaphoretic.  HENT:     Mouth/Throat:     Pharynx: No pharyngeal swelling.     Tonsils: No tonsillar exudate.  Neck:     Thyroid: No thyroid mass.  Abdominal:     Tenderness: There is no abdominal tenderness.   Genitourinary:    Pubic Area: No rash.      Labia:        Right: Rash (redness with erythema bil labias) present. No lesion.  Left: Tenderness (with swelling of labia) present. No lesion.      Vagina: Erythema, tenderness and lesions (exocriations within vaginal canal with some erythema) present. No vaginal discharge.     Cervix: No erythema.  Musculoskeletal:     Lumbar back: Normal. No tenderness.  Lymphadenopathy:     Cervical:     Right cervical: No superficial cervical adenopathy.    Left cervical: No superficial cervical adenopathy.  Neurological:     Mental Status: She is alert.    BP 134/86    Pulse 64    Ht 5\' 6"  (1.676 m)    Wt 187 lb (84.8 kg)    SpO2 98%    BMI 30.18 kg/m  Wt Readings from Last 3 Encounters:  09/17/21 187 lb (84.8 kg)  09/04/21 175 lb (79.4 kg)  02/27/21 189 lb (85.7 kg)     Health Maintenance Due  Topic Date Due   Zoster Vaccines- Shingrix (1 of 2) Never done   COVID-19 Vaccine (4 - Booster for Pfizer series) 08/07/2020   INFLUENZA VACCINE  02/24/2021    There are no preventive care reminders to display for this patient.  Lab Results  Component Value Date   TSH 1.28 09/25/2020   Lab Results  Component Value Date   WBC 6.6 10/16/2020   HGB 14.3 10/16/2020   HCT 43.2 10/16/2020   MCV 88.0 10/16/2020   PLT 246 10/16/2020   Lab Results  Component Value Date   NA 144 10/16/2020   K 3.8 10/16/2020   CO2 25 10/16/2020   GLUCOSE 124 (H) 10/16/2020   BUN 14 10/16/2020   CREATININE 0.91 10/16/2020   BILITOT 0.6 10/16/2020   ALKPHOS 102 10/16/2020   AST 14 (L) 10/16/2020   ALT 16 10/16/2020   PROT 6.9 10/16/2020   ALBUMIN 3.8 10/16/2020   CALCIUM 9.5 10/16/2020   ANIONGAP 10 10/16/2020   GFR 73.59 09/25/2020   Lab Results  Component Value Date   HGBA1C 5.9 09/25/2020      Assessment & Plan:   Problem List Items Addressed This Visit       Musculoskeletal and Integument   Vaginal itching    Yeast candida ordered  pending results.          Other   Vaginal discharge    No discharge evidence on physical exam however will order for both BV and yeast due to symptoms.       Relevant Orders   POCT Urinalysis Dipstick (Completed)   Urine Culture (Completed)   WET PREP BY MOLECULAR PROBE (Completed)   Vaginal discomfort    Suspected vaginal atrophy, however pending results of yeast/BV to confirm dx. If negative atrophy suspected and will refer to GYN as pt can not have estrogen therapy due to h/o breast cancer. She can try vaginal lubricants and gave recommendations to pt in regards to otc products such as replenz.       Dysuria - Primary    Urine culture ordered pending results.       Relevant Orders   POCT Urinalysis Dipstick (Completed)   Urine Culture (Completed)   WET PREP BY MOLECULAR PROBE (Completed)    No orders of the defined types were placed in this encounter.   Follow-up: Return if symptoms worsen or fail to improve.    Eugenia Pancoast, FNP

## 2021-09-18 ENCOUNTER — Other Ambulatory Visit: Payer: Self-pay | Admitting: Family

## 2021-09-18 DIAGNOSIS — N949 Unspecified condition associated with female genital organs and menstrual cycle: Secondary | ICD-10-CM

## 2021-09-18 DIAGNOSIS — N898 Other specified noninflammatory disorders of vagina: Secondary | ICD-10-CM | POA: Insufficient documentation

## 2021-09-18 DIAGNOSIS — N952 Postmenopausal atrophic vaginitis: Secondary | ICD-10-CM

## 2021-09-18 DIAGNOSIS — R300 Dysuria: Secondary | ICD-10-CM | POA: Insufficient documentation

## 2021-09-18 LAB — WET PREP BY MOLECULAR PROBE
Candida species: NOT DETECTED
Gardnerella vaginalis: NOT DETECTED
MICRO NUMBER:: 13042665
SPECIMEN QUALITY:: ADEQUATE
Trichomonas vaginosis: NOT DETECTED

## 2021-09-18 LAB — URINE CULTURE
MICRO NUMBER:: 13042666
SPECIMEN QUALITY:: ADEQUATE

## 2021-09-18 NOTE — Assessment & Plan Note (Signed)
Urine culture ordered pending results 

## 2021-09-18 NOTE — Assessment & Plan Note (Signed)
No discharge evidence on physical exam however will order for both BV and yeast due to symptoms.

## 2021-09-18 NOTE — Assessment & Plan Note (Signed)
Yeast candida ordered pending results.

## 2021-09-18 NOTE — Assessment & Plan Note (Signed)
Suspected vaginal atrophy, however pending results of yeast/BV to confirm dx. If negative atrophy suspected and will refer to GYN as pt can not have estrogen therapy due to h/o breast cancer. She can try vaginal lubricants and gave recommendations to pt in regards to otc products such as replenz.

## 2021-09-19 ENCOUNTER — Telehealth: Payer: Self-pay | Admitting: Family Medicine

## 2021-09-19 NOTE — Telephone Encounter (Signed)
Pt wanted to know what the name of the Vaginal Hydrating Gel was called, she is having a hard time finding one

## 2021-09-19 NOTE — Telephone Encounter (Signed)
LVM--[facility] Vaginal Hydrating Gel

## 2021-09-24 ENCOUNTER — Telehealth: Payer: Self-pay

## 2021-09-24 NOTE — Telephone Encounter (Signed)
[facility] referring  for Eval/treat Vaginal atrophy suspected , recommend vaginal moisturizer, Breast cancer survivor, estrogen not an option. [facility] only Called and left voicemail for patient to call back to be scheduled.

## 2021-09-24 NOTE — Telephone Encounter (Signed)
Patient is scheduled 10/08/21 with [facility]

## 2021-10-08 ENCOUNTER — Ambulatory Visit (INDEPENDENT_AMBULATORY_CARE_PROVIDER_SITE_OTHER): Payer: Medicare Other | Admitting: Family Medicine

## 2021-10-08 ENCOUNTER — Other Ambulatory Visit (HOSPITAL_COMMUNITY)
Admission: RE | Admit: 2021-10-08 | Discharge: 2021-10-08 | Disposition: A | Discharge status: Home / Self Care | Payer: Medicare Other | Source: Ambulatory Visit | Attending: Family Medicine | Admitting: Family Medicine

## 2021-10-08 ENCOUNTER — Other Ambulatory Visit: Payer: Self-pay

## 2021-10-08 ENCOUNTER — Encounter: Payer: Self-pay | Admitting: Family Medicine

## 2021-10-08 VITALS — BP 128/80 | Ht 66.0 in | Wt 188.0 lb

## 2021-10-08 DIAGNOSIS — L292 Pruritus vulvae: Secondary | ICD-10-CM

## 2021-10-08 DIAGNOSIS — N904 Leukoplakia of vulva: Secondary | ICD-10-CM | POA: Insufficient documentation

## 2021-10-08 NOTE — Progress Notes (Signed)
? ?  GYNECOLOGY PROBLEM  VISIT ENCOUNTER NOTE ? ?Subjective:  ? Ashley Rasmussen is a 71 y.o. female  [phone] here for a problem GYN visit.  Current complaints: vulvar itching for about 1 month. UTI and vaginal swab negative. No discharge. Mostly itching is external. Has used replenish cream which helps slightly. Not having regular intercourse with husband.   Was tested for vaginal yeast byt PCP and I reviewed these results which were negative. Additionally had testing for UTI that were negative.  ? ?Denies abnormal vaginal bleeding, discharge, pelvic pain, problems with intercourse or other gynecologic concerns.  ?  ?Gynecologic History ?No LMP recorded. Patient is postmenopausal. ? ?Contraception: post menopausal status ? ?Health Maintenance Due  ?Topic Date Due  ? Zoster Vaccines- Shingrix (1 of 2) Never done  ? COVID-19 Vaccine (4 - Booster for [place] series) 08/07/2020  ? INFLUENZA VACCINE  02/24/2021  ? MAMMOGRAM  10/02/2021  ? ? ?The following portions of the patient's history were reviewed and updated as appropriate: allergies, current medications, past family history, past medical history, past social history, past surgical history and problem list. ? ?Review of Systems ?Pertinent items are noted in HPI. ?  ?Objective:  ?BP 128/80   Ht '5\' 6"'$  (1.676 m)   Wt 188 lb (85.3 kg)   BMI 30.34 kg/m?  ?Gen: well appearing, NAD ?HEENT: no scleral icterus ?CV: RR ?Lung: Normal WOB ?Ext: warm well perfused ? ?PELVIC: partially obliterated labia minora inferiorly, white plaques most pronounced on the right labia but present throughout and one clitoris hood. Atrophic appearing vaginal mucosa and cervix.  No abnormal discharge noted.   Normal uterine size, no other palpable masses, no uterine or adnexal tenderness. ? ?VULVAR BIOPSY NOTE ?The indications for vulvar biopsy (rule out neoplasia, establish lichen sclerosus diagnosis) were reviewed.   Risks of the biopsy including pain, bleeding, infection, inadequate specimen,  scarring and need for additional procedures  were discussed. The patient stated understanding and agreed to undergo procedure today. Consent was signed,  time out performed.  ?The patient's vulva was prepped with Betadine. 1% lidocaine was injected into left lower labia/vulvar area. A 3-mm punch biopsy was done, biopsy tissue was picked up with sterile forceps and sterile scissors were used to excise the lesion.  Small bleeding was noted and hemostasis was achieved using silver nitrate sticks.  The patient tolerated the procedure well. Post-procedure instructions  (pelvic rest for one week) were given to the patient. The patient is to call with heavy bleeding, fever greater than 100.4, foul smelling vaginal discharge or other concerns. The patient will be return to clinic in two weeks for discussion of results. ? ? ?Assessment and Plan:  ?1. Lichen sclerosus of female genitalia ?Suspect this diagnosis, less likely vulvar cancer though this was discussed  ?Reviewed future treatment with clobetasol steroid cream if pathology confirms lichen sclerosis ?Patient cannot utilize estrogen creams due to her history of breast cancer ?- Surgical pathology ? ?2. Vulvar itching ?- Surgical pathology ? ? ?Please refer to After Visit Summary for other counseling recommendations.  ? ?No follow-ups on file. ? ?Caren Macadam, MD, MPH, ABFM ?Attending Physician ?[facility] ? ?

## 2021-10-10 LAB — SURGICAL PATHOLOGY

## 2021-10-14 ENCOUNTER — Encounter: Payer: Self-pay | Admitting: Family Medicine

## 2021-10-14 MED ORDER — CLOBETASOL PROPIONATE 0.05 % EX OINT: TOPICAL_OINTMENT | CUTANEOUS | 5 refills | Status: DC

## 2021-10-22 ENCOUNTER — Other Ambulatory Visit: Payer: Self-pay | Admitting: Family Medicine

## 2021-10-22 DIAGNOSIS — Z1231 Encounter for screening mammogram for malignant neoplasm of breast: Secondary | ICD-10-CM

## 2021-10-27 ENCOUNTER — Ambulatory Visit: Payer: PPO

## 2021-11-04 ENCOUNTER — Ambulatory Visit
Admission: RE | Admit: 2021-11-04 | Discharge: 2021-11-04 | Disposition: A | Discharge status: Home / Self Care | Payer: Medicare Other | Source: Ambulatory Visit | Attending: Family Medicine | Admitting: Family Medicine

## 2021-11-04 DIAGNOSIS — Z1231 Encounter for screening mammogram for malignant neoplasm of breast: Secondary | ICD-10-CM | POA: Diagnosis not present

## 2021-11-06 ENCOUNTER — Ambulatory Visit: Payer: PPO

## 2021-11-20 ENCOUNTER — Other Ambulatory Visit: Payer: Self-pay | Admitting: Family Medicine

## 2021-11-20 NOTE — Telephone Encounter (Signed)
Pt hasn't seen PCP in over a year, she's had a few acute appts with other providers, no future appts., please advise  ?

## 2021-11-20 NOTE — Telephone Encounter (Signed)
Letter mailed

## 2021-11-20 NOTE — Telephone Encounter (Signed)
Please schedule a physical when you can  ?

## 2021-12-03 ENCOUNTER — Ambulatory Visit (INDEPENDENT_AMBULATORY_CARE_PROVIDER_SITE_OTHER): Payer: Medicare Other | Admitting: *Deleted

## 2021-12-03 DIAGNOSIS — Z0000 Encounter for general adult medical examination without abnormal findings: Secondary | ICD-10-CM

## 2021-12-03 NOTE — Patient Instructions (Signed)
Ashley Rasmussen , ?Thank you for taking time to come for your Medicare Wellness Visit. I appreciate your ongoing commitment to your health goals. Please review the following plan we discussed and let me know if I can assist you in the future.  ? ?Screening recommendations/referrals: ?Colonoscopy: up to date ?Mammogram: up to date ?Bone Density: declined ?Recommended yearly ophthalmology/optometry visit for glaucoma screening and checkup ?Recommended yearly dental visit for hygiene and checkup ? ?Vaccinations: ?Influenza vaccine: Education provided ?Pneumococcal vaccine: up to date ?Tdap vaccine: up to date ?Shingles vaccine: Education provided   ? ?Advanced directives: Education provided ? ?Conditions/risks identified:  ? ? ? ?Preventive Care 72 Years and Older, Female ?Preventive care refers to lifestyle choices and visits with your health care provider that can promote health and wellness. ?What does preventive care include? ?A yearly physical exam. This is also called an annual well check. ?Dental exams once or twice a year. ?Routine eye exams. Ask your health care provider how often you should have your eyes checked. ?Personal lifestyle choices, including: ?Daily care of your teeth and gums. ?Regular physical activity. ?Eating a healthy diet. ?Avoiding tobacco and drug use. ?Limiting alcohol use. ?Practicing safe sex. ?Taking low-dose aspirin every day. ?Taking vitamin and mineral supplements as recommended by your health care provider. ?What happens during an annual well check? ?The services and screenings done by your health care provider during your annual well check will depend on your age, overall health, lifestyle risk factors, and family history of disease. ?Counseling  ?Your health care provider may ask you questions about your: ?Alcohol use. ?Tobacco use. ?Drug use. ?Emotional well-being. ?Home and relationship well-being. ?Sexual activity. ?Eating habits. ?History of falls. ?Memory and ability to understand  (cognition). ?Work and work Statistician. ?Reproductive health. ?Screening  ?You may have the following tests or measurements: ?Height, weight, and BMI. ?Blood pressure. ?Lipid and cholesterol levels. These may be checked every 5 years, or more frequently if you are over 41 years old. ?Skin check. ?Lung cancer screening. You may have this screening every year starting at age 72 if you have a 30-pack-year history of smoking and currently smoke or have quit within the past 15 years. ?Fecal occult blood test (FOBT) of the stool. You may have this test every year starting at age 72. ?Flexible sigmoidoscopy or colonoscopy. You may have a sigmoidoscopy every 5 years or a colonoscopy every 10 years starting at age 72. ?Hepatitis C blood test. ?Hepatitis B blood test. ?Sexually transmitted disease (STD) testing. ?Diabetes screening. This is done by checking your blood sugar (glucose) after you have not eaten for a while (fasting). You may have this done every 1-3 years. ?Bone density scan. This is done to screen for osteoporosis. You may have this done starting at age 72. ?Mammogram. This may be done every 1-2 years. Talk to your health care provider about how often you should have regular mammograms. ?Talk with your health care provider about your test results, treatment options, and if necessary, the need for more tests. ?Vaccines  ?Your health care provider may recommend certain vaccines, such as: ?Influenza vaccine. This is recommended every year. ?Tetanus, diphtheria, and acellular pertussis (Tdap, Td) vaccine. You may need a Td booster every 10 years. ?Zoster vaccine. You may need this after age 72. ?Pneumococcal 13-valent conjugate (PCV13) vaccine. One dose is recommended after age 19. ?Pneumococcal polysaccharide (PPSV23) vaccine. One dose is recommended after age 72. ?Talk to your health care provider about which screenings and vaccines you need and how  often you need them. ?This information is not intended to  replace advice given to you by your health care provider. Make sure you discuss any questions you have with your health care provider. ?Document Released: 08/09/2015 Document Revised: 04/01/2016 Document Reviewed: 05/14/2015 ?Elsevier Interactive Patient Education ? 2017 [place]. ? ?Fall Prevention in the Home ?Falls can cause injuries. They can happen to people of all ages. There are many things you can do to make your home safe and to help prevent falls. ?What can I do on the outside of my home? ?Regularly fix the edges of walkways and driveways and fix any cracks. ?Remove anything that might make you trip as you walk through a door, such as a raised step or threshold. ?Trim any bushes or trees on the path to your home. ?Use bright outdoor lighting. ?Clear any walking paths of anything that might make someone trip, such as rocks or tools. ?Regularly check to see if handrails are loose or broken. Make sure that both sides of any steps have handrails. ?Any raised decks and porches should have guardrails on the edges. ?Have any leaves, snow, or ice cleared regularly. ?Use sand or salt on walking paths during winter. ?Clean up any spills in your garage right away. This includes oil or grease spills. ?What can I do in the bathroom? ?Use night lights. ?Install grab bars by the toilet and in the tub and shower. Do not use towel bars as grab bars. ?Use non-skid mats or decals in the tub or shower. ?If you need to sit down in the shower, use a plastic, non-slip stool. ?Keep the floor dry. Clean up any water that spills on the floor as soon as it happens. ?Remove soap buildup in the tub or shower regularly. ?Attach bath mats securely with double-sided non-slip rug tape. ?Do not have throw rugs and other things on the floor that can make you trip. ?What can I do in the bedroom? ?Use night lights. ?Make sure that you have a light by your bed that is easy to reach. ?Do not use any sheets or blankets that are too big for  your bed. They should not hang down onto the floor. ?Have a firm chair that has side arms. You can use this for support while you get dressed. ?Do not have throw rugs and other things on the floor that can make you trip. ?What can I do in the kitchen? ?Clean up any spills right away. ?Avoid walking on wet floors. ?Keep items that you use a lot in easy-to-reach places. ?If you need to reach something above you, use a strong step stool that has a grab bar. ?Keep electrical cords out of the way. ?Do not use floor polish or wax that makes floors slippery. If you must use wax, use non-skid floor wax. ?Do not have throw rugs and other things on the floor that can make you trip. ?What can I do with my stairs? ?Do not leave any items on the stairs. ?Make sure that there are handrails on both sides of the stairs and use them. Fix handrails that are broken or loose. Make sure that handrails are as long as the stairways. ?Check any carpeting to make sure that it is firmly attached to the stairs. Fix any carpet that is loose or worn. ?Avoid having throw rugs at the top or bottom of the stairs. If you do have throw rugs, attach them to the floor with carpet tape. ?Make sure that you have a  light switch at the top of the stairs and the bottom of the stairs. If you do not have them, ask someone to add them for you. ?What else can I do to help prevent falls? ?Wear shoes that: ?Do not have high heels. ?Have rubber bottoms. ?Are comfortable and fit you well. ?Are closed at the toe. Do not wear sandals. ?If you use a stepladder: ?Make sure that it is fully opened. Do not climb a closed stepladder. ?Make sure that both sides of the stepladder are locked into place. ?Ask someone to hold it for you, if possible. ?Clearly mark and make sure that you can see: ?Any grab bars or handrails. ?First and last steps. ?Where the edge of each step is. ?Use tools that help you move around (mobility aids) if they are needed. These  include: ?Canes. ?Walkers. ?Scooters. ?Crutches. ?Turn on the lights when you go into a dark area. Replace any light bulbs as soon as they burn out. ?Set up your furniture so you have a clear path. Avoid moving your furniture

## 2021-12-03 NOTE — Progress Notes (Signed)
? ?Subjective:  ? Ashley Rasmussen is a 71 y.o. female who presents for Medicare Annual (Subsequent) preventive examination. ? ?I connected with  [name] on 12/03/21 by a telephone enabled telemedicine application and verified that I am speaking with the correct person using two identifiers. ?  ?I discussed the limitations of evaluation and management by telemedicine. The patient expressed understanding and agreed to proceed. ? ?Patient location: home ? ?Provider location: Tele-Health -home ? ? ? ?Review of Systems    ? ?Cardiac Risk Factors include: advanced age (>44mn, >>29women) ? ?   ?Objective:  ?  ?Today's Vitals  ? ?There is no height or weight on file to calculate BMI. ? ? ?  12/03/2021  ? 10:07 AM 10/30/2020  ?  8:32 AM 10/16/2020  ? 10:06 AM 04/11/2018  ?  9:38 AM 12/09/2017  ? 12:37 PM 09/29/2016  ?  7:00 PM 09/29/2016  ?  1:50 AM  ?Advanced Directives  ?Does Patient Have a Medical Advance Directive? No No No No No No No  ?Would patient like information on creating a medical advance directive?  No - Patient declined  No - Patient declined Yes (MAU/Ambulatory/Procedural Areas - Information given) Yes (Inpatient - patient requests chaplain consult to create a medical advance directive) No - Patient declined  ? ? ?Current Medications (verified) ?Outpatient Encounter Medications as of 12/03/2021  ?Medication Sig  ? acetaminophen (TYLENOL) 500 MG tablet Take 500 mg by mouth every 6 (six) hours as needed.  ? clobetasol ointment (TEMOVATE) 0.05 % Apply to affected area every night for 4 weeks, then every other day for 4 weeks and then twice a week for 4 weeks or until resolution.  ? Multiple Vitamin (MULTIVITAMIN) tablet Take 3 tablets by mouth daily.  ? omeprazole (PRILOSEC) 20 MG capsule Take 1 capsule (20 mg total) by mouth daily as needed.  ? benzonatate (TESSALON PERLES) 100 MG capsule Take 1 capsule (100 mg total) by mouth 3 (three) times daily as needed.  ? ?No facility-administered encounter medications on  file as of 12/03/2021.  ? ? ?Allergies (verified) ?Crestor [rosuvastatin], Alendronate sodium, Nsaids, Pravachol, Raloxifene, and Simvastatin  ? ?History: ?Past Medical History:  ?Diagnosis Date  ? Allergic rhinitis, cause unspecified   ? [place]  ? Complication of anesthesia   ? Family history of breast cancer   ? Family history of lung cancer   ? HLD (hyperlipidemia)   ? Osteopenia   ? Osteoporosis, unspecified   ? Personal history of chemotherapy   ? Personal history of malignant neoplasm of breast   ? needs yearly mammogram and MRI every 2  ? Personal history of radiation therapy   ? PONV (postoperative nausea and vomiting)   ? Thoracic or lumbosacral neuritis or radiculitis, unspecified   ? ?Past Surgical History:  ?Procedure Laterality Date  ? BREAST BIOPSY Left 03/2015  ? BREAST BIOPSY Right 10/09/2020  ? Breast cancer excision    ? BREAST LUMPECTOMY Right   ? COLONOSCOPY WITH PROPOFOL N/A 04/11/2018  ? Procedure: COLONOSCOPY WITH PROPOFOL;  Surgeon: AJonathon Bellows MD;  [facility];  Service: Gastroenterology;  Laterality: N/A;  ? LATISSIMUS FLAP TO BREAST    ? due to radiation burn  ? MASTECTOMY Right 10/30/2020  ? TOTAL MASTECTOMY Right 10/30/2020  ? Procedure: RIGHT MASTECTOMY;  Surgeon: TJovita Kussmaul MD;  [facility]  Service: General;  Laterality: Right;  ? ?Family History  ?Problem Relation Age of Onset  ?  Other Mother   ?     B-12 deficiency  ? Breast cancer Mother 38  ? Melanoma Mother   ?     dx 16s (foot) and 71s (arm)  ? Myelodysplastic syndrome Mother   ?     dx mid-70s  ? Osteoporosis Mother   ? Alcoholism Father   ? Other Sister   ?     B-12 deficiency  ? Breast cancer Sister 11  ?     "inconclusive" genetic testing  ? Osteoporosis Sister   ? Lung cancer Maternal Uncle   ?     radiation exposure   ? Cancer Paternal Aunt   ?     unknown type, dx >50  ? Cancer Paternal Aunt   ?     unknown type, dx >50  ? Breast cancer Cousin 81  ?      (maternal first cousin)  ? Breast cancer Cousin   ?     dx 12s or 41s (maternal first cousin)  ? Cancer Nephew 23  ?     Ewing's sarcoma  ? ?Social History  ? ?Socioeconomic History  ? Marital status: Married  ?  Spouse name: Not on file  ? Number of children: 4  ? Years of education: Not on file  ? Highest education level: Not on file  ?Occupational History  ? Not on file  ?Tobacco Use  ? Smoking status: Former  ?  Years: 2.00  ?  Types: Cigarettes  ?  Quit date: 07/27/1989  ?  Years since quitting: 32.3  ? Smokeless tobacco: Never  ? Tobacco comments:  ?  quit over 9yr ago  ?Vaping Use  ? Vaping Use: Never used  ?Substance and Sexual Activity  ? Alcohol use: No  ?  Alcohol/week: 0.0 standard drinks  ? Drug use: No  ? Sexual activity: Never  ?Other Topics Concern  ? Not on file  ?Social History Narrative  ? Married  ?   ? 4 children  ?   ?   ? ?Social Determinants of Health  ? ?Financial Resource Strain: Low Risk   ? Difficulty of Paying Living Expenses: Not hard at all  ?Food Insecurity: No Food Insecurity  ? Worried About RCharity fundraiserin the Last Year: Never true  ? Ran Out of Food in the Last Year: Never true  ?Transportation Needs: No Transportation Needs  ? Lack of Transportation (Medical): No  ? Lack of Transportation (Non-Medical): No  ?Physical Activity: Insufficiently Active  ? Days of Exercise per Week: 3 days  ? Minutes of Exercise per Session: 30 min  ?Stress: No Stress Concern Present  ? Feeling of Stress : Not at all  ?Social Connections: Moderately Isolated  ? Frequency of Communication with Friends and Family: More than three times a week  ? Frequency of Social Gatherings with Friends and Family: More than three times a week  ? Attends Religious Services: Never  ? Active Member of Clubs or Organizations: No  ? Attends CArchivistMeetings: Never  ? Marital Status: Married  ? ? ?Tobacco Counseling ?Counseling given: Not Answered ?Tobacco comments: quit over 364yrago ? ? ?Clinical  Intake: ? ?Pre-visit preparation completed: Yes ? ?Pain : No/denies pain ? ?  ? ?Nutritional Risks: None ?Diabetes: No ? ?How often do you need to have someone help you when you read instructions, pamphlets, or other written materials from your doctor or pharmacy?: 1 - Never ? ?Diabetic?  no ? ?  ? ?  Information entered by :: Leroy Kennedy LPN ? ? ?Activities of Daily Living ? ?  12/03/2021  ? 10:08 AM  ?In your present state of health, do you have any difficulty performing the following activities:  ?Hearing? 1  ?Comment left ear surgery.  ?Vision? 0  ?Difficulty concentrating or making decisions? 0  ?Walking or climbing stairs? 0  ?Dressing or bathing? 0  ?Doing errands, shopping? 0  ?Preparing Food and eating ? N  ?Using the Toilet? N  ?In the past six months, have you accidently leaked urine? N  ?Do you have problems with loss of bowel control? N  ?Managing your Medications? N  ?Managing your Finances? N  ?Housekeeping or managing your Housekeeping? N  ? ? ?Patient Care Team: ?Tower, [name], MD as PCP - General ?[name], RN as Oncology Nurse Navigator ?Rockwell Germany, RN as Oncology Nurse Navigator ?[name], MD as Consulting Physician (General Surgery) ?Truitt Merle, MD as Consulting Physician (Hematology) ?Kyung Rudd, MD as Consulting Physician (Radiation Oncology) ? ?Indicate any recent Medical Services you may have received from other than [facility] providers in the past year (date may be approximate). ? ?   ?Assessment:  ? This is a routine wellness examination for Jaziya. ? ?Hearing/Vision screen ?Hearing Screening - Comments:: Has had surgery in left ear. ?No trouble hearing ?Vision Screening - Comments:: Not up date ?Unsure of name ? ?Dietary issues and exercise activities discussed: ?Current Exercise Habits: Home exercise routine, Type of exercise: walking, Time (Minutes): 30, Intensity: Mild ? ? Goals Addressed   ? ?  ?  ?  ?  ? This Visit's Progress  ?  Increase physical activity     ?  Loose  weight ?  ? ?  ? ?Depression Screen ? ?  12/03/2021  ? 10:15 AM 09/27/2020  ? 11:34 AM 06/27/2019  ? 11:44 AM 12/09/2017  ? 12:38 PM 11/24/2016  ? 11:35 AM 09/26/2015  ?  1:37 PM  ?PHQ 2/9 Scores  ?PHQ - 2 Score 0 0 0 0 0 0  ?

## 2021-12-29 DIAGNOSIS — H7192 Unspecified cholesteatoma, left ear: Secondary | ICD-10-CM | POA: Diagnosis not present

## 2022-02-17 DIAGNOSIS — H52222 Regular astigmatism, left eye: Secondary | ICD-10-CM | POA: Diagnosis not present

## 2022-02-17 DIAGNOSIS — H2513 Age-related nuclear cataract, bilateral: Secondary | ICD-10-CM | POA: Diagnosis not present

## 2022-02-17 DIAGNOSIS — H5202 Hypermetropia, left eye: Secondary | ICD-10-CM | POA: Diagnosis not present

## 2022-02-17 DIAGNOSIS — H524 Presbyopia: Secondary | ICD-10-CM | POA: Diagnosis not present

## 2022-03-19 ENCOUNTER — Other Ambulatory Visit: Payer: Self-pay | Admitting: Family Medicine

## 2022-04-22 DIAGNOSIS — D2271 Melanocytic nevi of right lower limb, including hip: Secondary | ICD-10-CM | POA: Diagnosis not present

## 2022-04-22 DIAGNOSIS — L570 Actinic keratosis: Secondary | ICD-10-CM | POA: Diagnosis not present

## 2022-04-22 DIAGNOSIS — D2272 Melanocytic nevi of left lower limb, including hip: Secondary | ICD-10-CM | POA: Diagnosis not present

## 2022-04-22 DIAGNOSIS — D2261 Melanocytic nevi of right upper limb, including shoulder: Secondary | ICD-10-CM | POA: Diagnosis not present

## 2022-04-22 DIAGNOSIS — L821 Other seborrheic keratosis: Secondary | ICD-10-CM | POA: Diagnosis not present

## 2022-04-22 DIAGNOSIS — D2262 Melanocytic nevi of left upper limb, including shoulder: Secondary | ICD-10-CM | POA: Diagnosis not present

## 2022-04-22 DIAGNOSIS — D225 Melanocytic nevi of trunk: Secondary | ICD-10-CM | POA: Diagnosis not present

## 2022-04-22 DIAGNOSIS — X32XXXA Exposure to sunlight, initial encounter: Secondary | ICD-10-CM | POA: Diagnosis not present

## 2022-04-23 ENCOUNTER — Telehealth: Payer: Self-pay | Admitting: Family Medicine

## 2022-04-23 DIAGNOSIS — Z0000 Encounter for general adult medical examination without abnormal findings: Secondary | ICD-10-CM

## 2022-04-23 DIAGNOSIS — R000 Tachycardia, unspecified: Secondary | ICD-10-CM

## 2022-04-23 DIAGNOSIS — R7309 Other abnormal glucose: Secondary | ICD-10-CM

## 2022-04-23 DIAGNOSIS — M810 Age-related osteoporosis without current pathological fracture: Secondary | ICD-10-CM

## 2022-04-23 DIAGNOSIS — E7849 Other hyperlipidemia: Secondary | ICD-10-CM

## 2022-04-23 NOTE — Telephone Encounter (Signed)
-----   Message from Ellamae Sia sent at 04/09/2022 10:59 AM EDT ----- Regarding: Lab orders for Friday, 9.29.23 Patient is scheduled for CPX labs, please order future labs, Thanks , Karna Christmas

## 2022-04-24 ENCOUNTER — Other Ambulatory Visit (INDEPENDENT_AMBULATORY_CARE_PROVIDER_SITE_OTHER): Payer: Medicare Other

## 2022-04-24 DIAGNOSIS — R000 Tachycardia, unspecified: Secondary | ICD-10-CM | POA: Diagnosis not present

## 2022-04-24 DIAGNOSIS — M810 Age-related osteoporosis without current pathological fracture: Secondary | ICD-10-CM

## 2022-04-24 DIAGNOSIS — R7309 Other abnormal glucose: Secondary | ICD-10-CM

## 2022-04-24 DIAGNOSIS — E7849 Other hyperlipidemia: Secondary | ICD-10-CM

## 2022-04-24 LAB — COMPREHENSIVE METABOLIC PANEL
ALT: 12 U/L (ref 0–35)
AST: 13 U/L (ref 0–37)
Albumin: 4 g/dL (ref 3.5–5.2)
Alkaline Phosphatase: 93 U/L (ref 39–117)
BUN: 18 mg/dL (ref 6–23)
CO2: 28 mEq/L (ref 19–32)
Calcium: 9.6 mg/dL (ref 8.4–10.5)
Chloride: 107 mEq/L (ref 96–112)
Creatinine, Ser: 0.82 mg/dL (ref 0.40–1.20)
GFR: 71.72 mL/min (ref >60.00)
Glucose, Bld: 99 mg/dL (ref 70–99)
Potassium: 4.1 mEq/L (ref 3.5–5.1)
Sodium: 142 mEq/L (ref 135–145)
Total Bilirubin: 0.7 mg/dL (ref 0.2–1.2)
Total Protein: 6.3 g/dL (ref 6.0–8.3)

## 2022-04-24 LAB — LIPID PANEL
Cholesterol: 248 mg/dL — ABNORMAL HIGH (ref 0–200)
HDL: 44 mg/dL (ref >39.00)
LDL Cholesterol: 171 mg/dL — ABNORMAL HIGH (ref 0–99)
NonHDL: 203.7
Total CHOL/HDL Ratio: 6
Triglycerides: 164 mg/dL — ABNORMAL HIGH (ref 0.0–149.0)
VLDL: 32.8 mg/dL (ref 0.0–40.0)

## 2022-04-24 LAB — CBC WITH DIFFERENTIAL/PLATELET
Basophils Absolute: 0.1 10*3/uL (ref 0.0–0.1)
Basophils Relative: 1 % (ref 0.0–3.0)
Eosinophils Absolute: 0.1 10*3/uL (ref 0.0–0.7)
Eosinophils Relative: 1.4 % (ref 0.0–5.0)
HCT: 42.3 % (ref 36.0–46.0)
Hemoglobin: 14.2 g/dL (ref 12.0–15.0)
Lymphocytes Relative: 23.5 % (ref 12.0–46.0)
Lymphs Abs: 1.8 10*3/uL (ref 0.7–4.0)
MCHC: 33.5 g/dL (ref 30.0–36.0)
MCV: 88.8 fl (ref 78.0–100.0)
Monocytes Absolute: 0.5 10*3/uL (ref 0.1–1.0)
Monocytes Relative: 6.6 % (ref 3.0–12.0)
Neutro Abs: 5.1 10*3/uL (ref 1.4–7.7)
Neutrophils Relative %: 67.5 % (ref 43.0–77.0)
Platelets: 223 10*3/uL (ref 150.0–400.0)
RBC: 4.77 Mil/uL (ref 3.87–5.11)
RDW: 13.5 % (ref 11.5–15.5)
WBC: 7.5 10*3/uL (ref 4.0–10.5)

## 2022-04-24 LAB — TSH: TSH: 1.11 u[IU]/mL (ref 0.35–5.50)

## 2022-04-24 LAB — HEMOGLOBIN A1C: Hgb A1c MFr Bld: 5.9 % (ref 4.6–6.5)

## 2022-04-24 LAB — VITAMIN D 25 HYDROXY (VIT D DEFICIENCY, FRACTURES): VITD: 32.03 ng/mL (ref 30.00–100.00)

## 2022-04-30 ENCOUNTER — Encounter: Payer: Self-pay | Admitting: Family Medicine

## 2022-04-30 ENCOUNTER — Ambulatory Visit (INDEPENDENT_AMBULATORY_CARE_PROVIDER_SITE_OTHER): Payer: Medicare Other | Admitting: Family Medicine

## 2022-04-30 VITALS — BP 136/82 | HR 110 | Temp 97.6°F | Ht 65.25 in | Wt 189.2 lb

## 2022-04-30 DIAGNOSIS — Z23 Encounter for immunization: Secondary | ICD-10-CM

## 2022-04-30 DIAGNOSIS — R7309 Other abnormal glucose: Secondary | ICD-10-CM

## 2022-04-30 DIAGNOSIS — E7849 Other hyperlipidemia: Secondary | ICD-10-CM

## 2022-04-30 DIAGNOSIS — M810 Age-related osteoporosis without current pathological fracture: Secondary | ICD-10-CM

## 2022-04-30 DIAGNOSIS — H9192 Unspecified hearing loss, left ear: Secondary | ICD-10-CM | POA: Diagnosis not present

## 2022-04-30 DIAGNOSIS — Z808 Family history of malignant neoplasm of other organs or systems: Secondary | ICD-10-CM | POA: Diagnosis not present

## 2022-04-30 DIAGNOSIS — E6609 Other obesity due to excess calories: Secondary | ICD-10-CM | POA: Diagnosis not present

## 2022-04-30 DIAGNOSIS — Z0000 Encounter for general adult medical examination without abnormal findings: Secondary | ICD-10-CM

## 2022-04-30 DIAGNOSIS — K219 Gastro-esophageal reflux disease without esophagitis: Secondary | ICD-10-CM | POA: Diagnosis not present

## 2022-04-30 DIAGNOSIS — Z6830 Body mass index (BMI) 30.0-30.9, adult: Secondary | ICD-10-CM

## 2022-04-30 DIAGNOSIS — Z853 Personal history of malignant neoplasm of breast: Secondary | ICD-10-CM

## 2022-04-30 DIAGNOSIS — L900 Lichen sclerosus et atrophicus: Secondary | ICD-10-CM

## 2022-04-30 MED ORDER — EZETIMIBE 10 MG PO TABS: 10.0000 mg | ORAL_TABLET | Freq: Every day | ORAL | 3 refills | Status: DC

## 2022-04-30 NOTE — Assessment & Plan Note (Signed)
Discussed how this problem influences overall health and the risks it imposes  Reviewed plan for weight loss with lower calorie diet (via better food choices and also portion control or program like weight watchers) and exercise building up to or more than 30 minutes 5 days per week including some aerobic activity    

## 2022-04-30 NOTE — Assessment & Plan Note (Signed)
Reviewed health habits including diet and exercise and skin cancer prevention Reviewed appropriate screening tests for age  Also reviewed health mt list, fam hx and immunization status , as well as social and family history   See HPI Labs reviewed  Flu shot given  Declines shingrix vaccine  Mammogram utd 10/2021, continues breast cancer care Colonoscopy utd 03/2018  Declines dexa (one fall/no fx) disc fall prevention and need for exercise

## 2022-04-30 NOTE — Assessment & Plan Note (Signed)
Now seeing derm regularly for skin screening Is utd

## 2022-04-30 NOTE — Assessment & Plan Note (Signed)
Sees gyn and was dx with lichen sclerosis  Improved on clobetasol ointment

## 2022-04-30 NOTE — Assessment & Plan Note (Signed)
dexa 09 Pt declines another for now  One fall/no fx Disc fall prev and need for wt bearing exercise  Enc to continue ca and D Past tamoxifen

## 2022-04-30 NOTE — Assessment & Plan Note (Signed)
R mastectomy Is considering prophylactic L mastectomy in the future

## 2022-04-30 NOTE — Assessment & Plan Note (Signed)
Was dx with cholesteatoma  Sees ENT Several proceedures Doing better

## 2022-04-30 NOTE — Assessment & Plan Note (Signed)
Takes omeprazole now Better controlled Urge to watch diet and work on wt loss

## 2022-04-30 NOTE — Progress Notes (Signed)
Subjective:    Patient ID: Ashley Rasmussen, female    DOB: 10/29/1950, 71 y.o.   MRN: [id number]  HPI Pt presents for annual f/u of chronic health problems  Wt Readings from Last 3 Encounters:  04/30/22 189 lb 4 oz (85.8 kg)  10/08/21 188 lb (85.3 kg)  09/17/21 187 lb (84.8 kg)   31.25 kg/m  Helping care for fam member with rectal cancer Grand kids also   Feels great overall Trying to take care of herself   Has seen ENT for cholesteatoma f/u /no evid of re occurrence  Several surgeries for that  Does not ear great out of that ear, some tinnitus  (ent does flush wax out)    Immunization History  Administered Date(s) Administered   Fluad Quad(high Dose 65+) 05/31/2020   Influenza Split 06/21/2012   Influenza Whole 07/13/2002   Influenza,inj,Quad PF,6+ Mos 05/23/2013, 09/26/2015, 05/22/2016, 06/03/2017, 05/12/2018, 03/21/2019   PFIZER(Purple Top)SARS-COV-2 Vaccination 11/06/2019, 12/05/2019, 06/12/2020   Pneumococcal Conjugate-13 09/26/2015   Pneumococcal Polysaccharide-23 11/24/2016   Td 05/27/2005   Tdap 11/24/2016   Health Maintenance Due  Topic Date Due   Zoster Vaccines- Shingrix (1 of 2) Never done   COVID-19 Vaccine (4 - Pfizer risk series) 08/07/2020   INFLUENZA VACCINE  02/24/2022   Keeps up with health mt   Shingrix : declines   Flu shot : today   Mammogram 10/2021 negative  Personal h/o breast cancer-  had a unilateral mastectomy  Her surgeon would like to remove the other breast-she is considering it if medicare will pay  Did not need addnl treatment  Strong fam hx also  She has had genetic testing that was nl Self breast exam    Is being treated for vaginal lichen sclerosis with steroid cream at gyn   Colonoscopy 03/2018   Dexa  09, declined another , declines now  Has h/o OP Past h/o tamoxifen Falls: tripped over something in the floor- no injuries  Fractures:none Supplements  Exercise : wants to start/considering gym   Dermatology care  - she had a visit recently  Had a skin screening and did not have to remove anything  Froze a few spots on face  Fam h/o melanoma  She is good about sun protection    BP Readings from Last 3 Encounters:  04/30/22 136/82  10/08/21 128/80  09/17/21 134/86   Pulse Readings from Last 3 Encounters:  04/30/22 (!) 110  09/17/21 64  02/27/21 (!) 119     GERD Takes omeprazole  Lab Results  Component Value Date   VITAMINB12 265 04/06/2008   Past elevated glucose level Lab Results  Component Value Date   HGBA1C 5.9 04/24/2022    Hyperlipidemia  Lab Results  Component Value Date   CHOL 248 (H) 04/24/2022   CHOL 252 (H) 09/25/2020   CHOL 224 (H) 06/19/2019   Lab Results  Component Value Date   HDL 44.00 04/24/2022   HDL 46.30 09/25/2020   HDL 42.20 06/19/2019   Lab Results  Component Value Date   LDLCALC 171 (H) 04/24/2022   LDLCALC 172 (H) 09/25/2020   LDLCALC 153 (H) 06/19/2019   Lab Results  Component Value Date   TRIG 164.0 (H) 04/24/2022   TRIG 169.0 (H) 09/25/2020   TRIG 144.0 06/19/2019   Lab Results  Component Value Date   CHOLHDL 6 04/24/2022   CHOLHDL 5 09/25/2020   CHOLHDL 5 06/19/2019   Lab Results  Component Value Date   LDLDIRECT 153.0  11/24/2016   LDLDIRECT 177.0 09/27/2015   LDLDIRECT 140.9 08/09/2012   She is statin intolerant  Could not tolerate even low dose intermittent Eats steak once a month  Otherwise avoids sat and trans fats     The 10-year ASCVD risk score (Arnett DK, et al., 2019) is: 12.9%   Values used to calculate the score:     Age: 76 years     Sex: Female     Is Non-Hispanic African American: No     Diabetic: No     Tobacco smoker: No     Systolic Blood Pressure: 454 mmHg     Is BP treated: No     HDL Cholesterol: 44 mg/dL     Total Cholesterol: 248 mg/dL  Lab Results  Component Value Date   WBC 7.5 04/24/2022   HGB 14.2 04/24/2022   HCT 42.3 04/24/2022   MCV 88.8 04/24/2022   PLT 223.0 04/24/2022   Lab  Results  Component Value Date   CREATININE 0.82 04/24/2022   BUN 18 04/24/2022   NA 142 04/24/2022   K 4.1 04/24/2022   CL 107 04/24/2022   CO2 28 04/24/2022   Lab Results  Component Value Date   ALT 12 04/24/2022   AST 13 04/24/2022   ALKPHOS 93 04/24/2022   BILITOT 0.7 04/24/2022   Lab Results  Component Value Date   TSH 1.11 04/24/2022   Patient Active Problem List   Diagnosis Date Noted   Dysuria 09/18/2021   Frequent urination 02/27/2021   Genetic testing 10/24/2020   Family history of breast cancer    Family history of lung cancer    Ductal carcinoma in situ (DCIS) of right breast 09/81/1914   Lichen sclerosus 78/29/5621   Medicare annual wellness visit, subsequent 06/27/2019   Estrogen deficiency 01/26/2018   Elevated glucose level 12/08/2017   Family history of melanoma 11/24/2016   Tachycardia    Hearing loss in left ear 10/02/2015   GERD (gastroesophageal reflux disease) 05/08/2015   Obesity 12/13/2012   Encounter for routine gynecological examination 08/16/2012   Colon cancer screening 08/16/2012   Routine general medical examination at a health care facility 08/08/2012   Uterine prolapse 07/06/2012   BACK PAIN, LUMBAR, WITH RADICULOPATHY 08/15/2010   Hyperlipidemia 04/13/2008   Osteoporosis 04/06/2008   Allergic rhinitis 04/15/2007   BREAST CANCER, HX OF 04/15/2007   Past Medical History:  Diagnosis Date   Allergic rhinitis, cause unspecified    Breast cancer ([place]) 3086   Complication of anesthesia    Family history of breast cancer    Family history of lung cancer    HLD (hyperlipidemia)    Osteopenia    Osteoporosis, unspecified    Personal history of chemotherapy    Personal history of malignant neoplasm of breast    needs yearly mammogram and MRI every 2   Personal history of radiation therapy    PONV (postoperative nausea and vomiting)    Thoracic or lumbosacral neuritis or radiculitis, unspecified    Past Surgical History:  Procedure  Laterality Date   BREAST BIOPSY Left 03/2015   BREAST BIOPSY Right 10/09/2020   Breast cancer excision     BREAST LUMPECTOMY Right    COLONOSCOPY WITH PROPOFOL N/A 04/11/2018   Procedure: COLONOSCOPY WITH PROPOFOL;  Surgeon: Jonathon Bellows, MD;  Location: [facility] ENDOSCOPY;  Service: Gastroenterology;  Laterality: N/A;   LATISSIMUS FLAP TO BREAST     due to radiation burn   MASTECTOMY Right 10/30/2020   TOTAL  MASTECTOMY Right 10/30/2020   Procedure: RIGHT MASTECTOMY;  Surgeon: [name], MD;  Location: [place];  Service: General;  Laterality: Right;   Social History   Tobacco Use   Smoking status: Former    Years: 2.00    Types: Cigarettes    Quit date: 07/27/1989    Years since quitting: 32.7   Smokeless tobacco: Never   Tobacco comments:    quit over 31yr ago  Vaping Use   Vaping Use: Never used  Substance Use Topics   Alcohol use: No    Alcohol/week: 0.0 standard drinks of alcohol   Drug use: No   Family History  Problem Relation Age of Onset   Other Mother        B-12 deficiency   Breast cancer Mother 668  Melanoma Mother        dx 249s(foot) and 673s(arm)   Myelodysplastic syndrome Mother        dx mid-70s   Osteoporosis Mother    Alcoholism Father    Other Sister        B-12 deficiency   Breast cancer Sister 32      "inconclusive" genetic testing   Osteoporosis Sister    Lung cancer Maternal Uncle        radiation exposure    Cancer Paternal Aunt        unknown type, dx >50   Cancer Paternal Aunt        unknown type, dx >50   Breast cancer Cousin 379      (maternal first cousin)   Breast cancer Cousin        dx 551sor 654s(maternal first cousin)   Cancer Nephew 23       Ewing's sarcoma   Allergies  Allergen Reactions   Crestor [Rosuvastatin]     Myalgia  Even once weekly dose   Alendronate Sodium Other (See Comments)    REACTION: GI side effects   Nsaids Other (See Comments)    GI upset with oral nsaids   Pravachol Other (See  Comments)    myalgias   Raloxifene Other (See Comments)    REACTION: breast tenderness and leg pain   Simvastatin Other (See Comments)    REACTION: myalgias   Current Outpatient Medications on File Prior to Visit  Medication Sig Dispense Refill   acetaminophen (TYLENOL) 500 MG tablet Take 500 mg by mouth every 6 (six) hours as needed.     clobetasol ointment (TEMOVATE) 0.05 % Apply to affected area every night for 4 weeks, then every other day for 4 weeks and then twice a week for 4 weeks or until resolution. 30 g 5   Multiple Vitamin (MULTIVITAMIN) tablet Take 3 tablets by mouth daily.     omeprazole (PRILOSEC) 20 MG capsule TAKE 1 CAPSULE DAILY AS NEEDED 90 capsule 3   No current facility-administered medications on file prior to visit.      Review of Systems  Constitutional:  Negative for activity change, appetite change, fatigue, fever and unexpected weight change.  HENT:  Negative for congestion, ear pain, rhinorrhea, sinus pressure and sore throat.   Eyes:  Negative for pain, redness and visual disturbance.  Respiratory:  Negative for cough, shortness of breath and wheezing.   Cardiovascular:  Negative for chest pain and palpitations.  Gastrointestinal:  Negative for abdominal pain, blood in stool, constipation and diarrhea.  Endocrine: Negative for polydipsia and polyuria.  Genitourinary:  Negative for  dysuria, frequency and urgency.  Musculoskeletal:  Negative for arthralgias, back pain and myalgias.  Skin:  Negative for pallor and rash.  Allergic/Immunologic: Negative for environmental allergies.  Neurological:  Negative for dizziness, syncope and headaches.  Hematological:  Negative for adenopathy. Does not bruise/bleed easily.  Psychiatric/Behavioral:  Negative for decreased concentration and dysphoric mood. The patient is not nervous/anxious.        Objective:   Physical Exam Constitutional:      General: She is not in acute distress.    Appearance: Normal  appearance. She is well-developed. She is obese. She is not ill-appearing or diaphoretic.  HENT:     Head: Normocephalic and atraumatic.     Right Ear: Tympanic membrane, ear canal and external ear normal.     Left Ear: Tympanic membrane, ear canal and external ear normal.     Nose: Nose normal. No congestion.     Mouth/Throat:     Mouth: Mucous membranes are moist.     Pharynx: Oropharynx is clear. No posterior oropharyngeal erythema.  Eyes:     General: No scleral icterus.    Extraocular Movements: Extraocular movements intact.     Conjunctiva/sclera: Conjunctivae normal.     Pupils: Pupils are equal, round, and reactive to light.  Neck:     Thyroid: No thyromegaly.     Vascular: No carotid bruit or JVD.  Cardiovascular:     Rate and Rhythm: Normal rate and regular rhythm.     Pulses: Normal pulses.     Heart sounds: Normal heart sounds.     No gallop.  Pulmonary:     Effort: Pulmonary effort is normal. No respiratory distress.     Breath sounds: Normal breath sounds. No wheezing.     Comments: Good air exch Chest:     Chest wall: No tenderness.  Abdominal:     General: Bowel sounds are normal. There is no distension or abdominal bruit.     Palpations: Abdomen is soft. There is no mass.     Tenderness: There is no abdominal tenderness.     Hernia: No hernia is present.  Genitourinary:    Comments: Breast exam:left; No mass, nodules, thickening, tenderness, bulging, retraction, inflamation, nipple discharge or skin changes noted.  No axillary or clavicular LA.   R breast is surgically absent  Prior muscle flap scar is intact  No lumps in axilla     Musculoskeletal:        General: No tenderness. Normal range of motion.     Cervical back: Normal range of motion and neck supple. No rigidity. No muscular tenderness.     Right lower leg: No edema.     Left lower leg: No edema.     Comments: No kyphosis   Lymphadenopathy:     Cervical: No cervical adenopathy.  Skin:     General: Skin is warm and dry.     Coloration: Skin is not pale.     Findings: No erythema or rash.     Comments: Solar lentigines diffusely  Scattered SKs on her back  Neurological:     Mental Status: She is alert. Mental status is at baseline.     Cranial Nerves: No cranial nerve deficit.     Motor: No abnormal muscle tone.     Coordination: Coordination normal.     Gait: Gait normal.     Deep Tendon Reflexes: Reflexes are normal and symmetric. Reflexes normal.  Psychiatric:        Mood  and Affect: Mood normal.        Cognition and Memory: Cognition and memory normal.           Assessment & Plan:   Problem List Items Addressed This Visit       Digestive   GERD (gastroesophageal reflux disease)    Takes omeprazole now Better controlled Urge to watch diet and work on wt loss         Nervous and Auditory   Hearing loss in left ear    Was dx with cholesteatoma  Sees ENT Several proceedures Doing better        Musculoskeletal and Integument   Osteoporosis    dexa 09 Pt declines another for now  One fall/no fx Disc fall prev and need for wt bearing exercise  Enc to continue ca and D Past tamoxifen         Other   BREAST CANCER, HX OF    R mastectomy Is considering prophylactic L mastectomy in the future      Elevated glucose level    Lab Results  Component Value Date   HGBA1C 5.9 04/24/2022  This is stable disc imp of low glycemic diet and wt loss to prevent DM2       Family history of melanoma    Now seeing derm regularly for skin screening Is utd       Hyperlipidemia    LDL is 171  Disc goals for lipids and reasons to control them Rev last labs with pt Rev low sat fat diet in detail Not controlled despite diet effort  Intol of even intermittent statin Will try zetia 10 mg and re check in 6 wk        Relevant Medications   ezetimibe (ZETIA) 10 MG tablet   Other Relevant Orders   Lipid panel   ALT   AST   Lichen sclerosus     Sees gyn and was dx with lichen sclerosis  Improved on clobetasol ointment      Obesity    Discussed how this problem influences overall health and the risks it imposes  Reviewed plan for weight loss with lower calorie diet (via better food choices and also portion control or program like weight watchers) and exercise building up to or more than 30 minutes 5 days per week including some aerobic activity         Routine general medical examination at a health care facility - Primary    Reviewed health habits including diet and exercise and skin cancer prevention Reviewed appropriate screening tests for age  Also reviewed health mt list, fam hx and immunization status , as well as social and family history   See HPI Labs reviewed  Flu shot given  Declines shingrix vaccine  Mammogram utd 10/2021, continues breast cancer care Colonoscopy utd 03/2018  Declines dexa (one fall/no fx) disc fall prevention and need for exercise       Relevant Orders   Flu Vaccine QUAD High Dose(Fluad) (Completed)   Other Visit Diagnoses     Need for influenza vaccination       Relevant Orders   Flu Vaccine QUAD High Dose(Fluad) (Completed)

## 2022-04-30 NOTE — Assessment & Plan Note (Signed)
LDL is 171  Disc goals for lipids and reasons to control them Rev last labs with pt Rev low sat fat diet in detail Not controlled despite diet effort  Intol of even intermittent statin Will try zetia 10 mg and re check in 6 wk

## 2022-04-30 NOTE — Patient Instructions (Addendum)
Since you cannot take statins, try generic zetia once daily  We will re check your cholesterol in 6 weeks   Avoid red meat/ fried foods/ egg yolks/ fatty breakfast meats/ butter, cheese and high fat dairy/ and shellfish     To prevent diabetes Try to get most of your carbohydrates from produce (with the exception of white potatoes)  Eat less bread/pasta/rice/snack foods/cereals/sweets and other items from the middle of the grocery store (processed carbs)  Flu shot today

## 2022-04-30 NOTE — Assessment & Plan Note (Signed)
Lab Results  Component Value Date   HGBA1C 5.9 04/24/2022   This is stable disc imp of low glycemic diet and wt loss to prevent DM2

## 2022-08-11 DIAGNOSIS — H43812 Vitreous degeneration, left eye: Secondary | ICD-10-CM | POA: Diagnosis not present

## 2022-09-21 ENCOUNTER — Other Ambulatory Visit: Payer: Self-pay | Admitting: Family Medicine

## 2022-09-21 DIAGNOSIS — Z1231 Encounter for screening mammogram for malignant neoplasm of breast: Secondary | ICD-10-CM

## 2022-09-25 DIAGNOSIS — D0511 Intraductal carcinoma in situ of right breast: Secondary | ICD-10-CM | POA: Diagnosis not present

## 2022-09-28 ENCOUNTER — Other Ambulatory Visit: Payer: Self-pay | Admitting: General Surgery

## 2022-09-28 DIAGNOSIS — D0511 Intraductal carcinoma in situ of right breast: Secondary | ICD-10-CM

## 2022-10-06 ENCOUNTER — Telehealth: Payer: Self-pay | Admitting: Family Medicine

## 2022-10-06 NOTE — Telephone Encounter (Signed)
Patient would like to know if medicationezetimibe (ZETIA) 10 MG tablet  her cholesterol medication can be changed to 5 mg instead of '10mg'$ . She stated that it is causing her joints to hurt.

## 2022-10-07 NOTE — Telephone Encounter (Signed)
Pt notified of Dr. Marliss Coots comments and verbalized understanding. Pt will take 1/2 for a week and update [place]

## 2022-10-07 NOTE — Telephone Encounter (Signed)
Cut pill in 1/2 and take 1/2 daily of the 10 mg pills Change on med list to reflect that  Give it a week and let me know if the pain is not gone  Will make a plan from there

## 2022-11-03 IMAGING — MG MM DIGITAL DIAGNOSTIC UNILAT*R*
3 series · 3 of 3 positions shown · non-contrast
Comparison: Previous exam(s).

CLINICAL DATA: 69-year-old female presenting as a recall from
screening for possible right breast calcifications. Patient has a
remote history of right breast cancer. Strong family history of
breast cancer in the patient's mother and sister.

EXAM:
DIGITAL DIAGNOSTIC UNILATERAL RIGHT MAMMOGRAM
TECHNIQUE: Right digital diagnostic mammography was performed. Mammographic
images were processed with CAD.

[R CC]
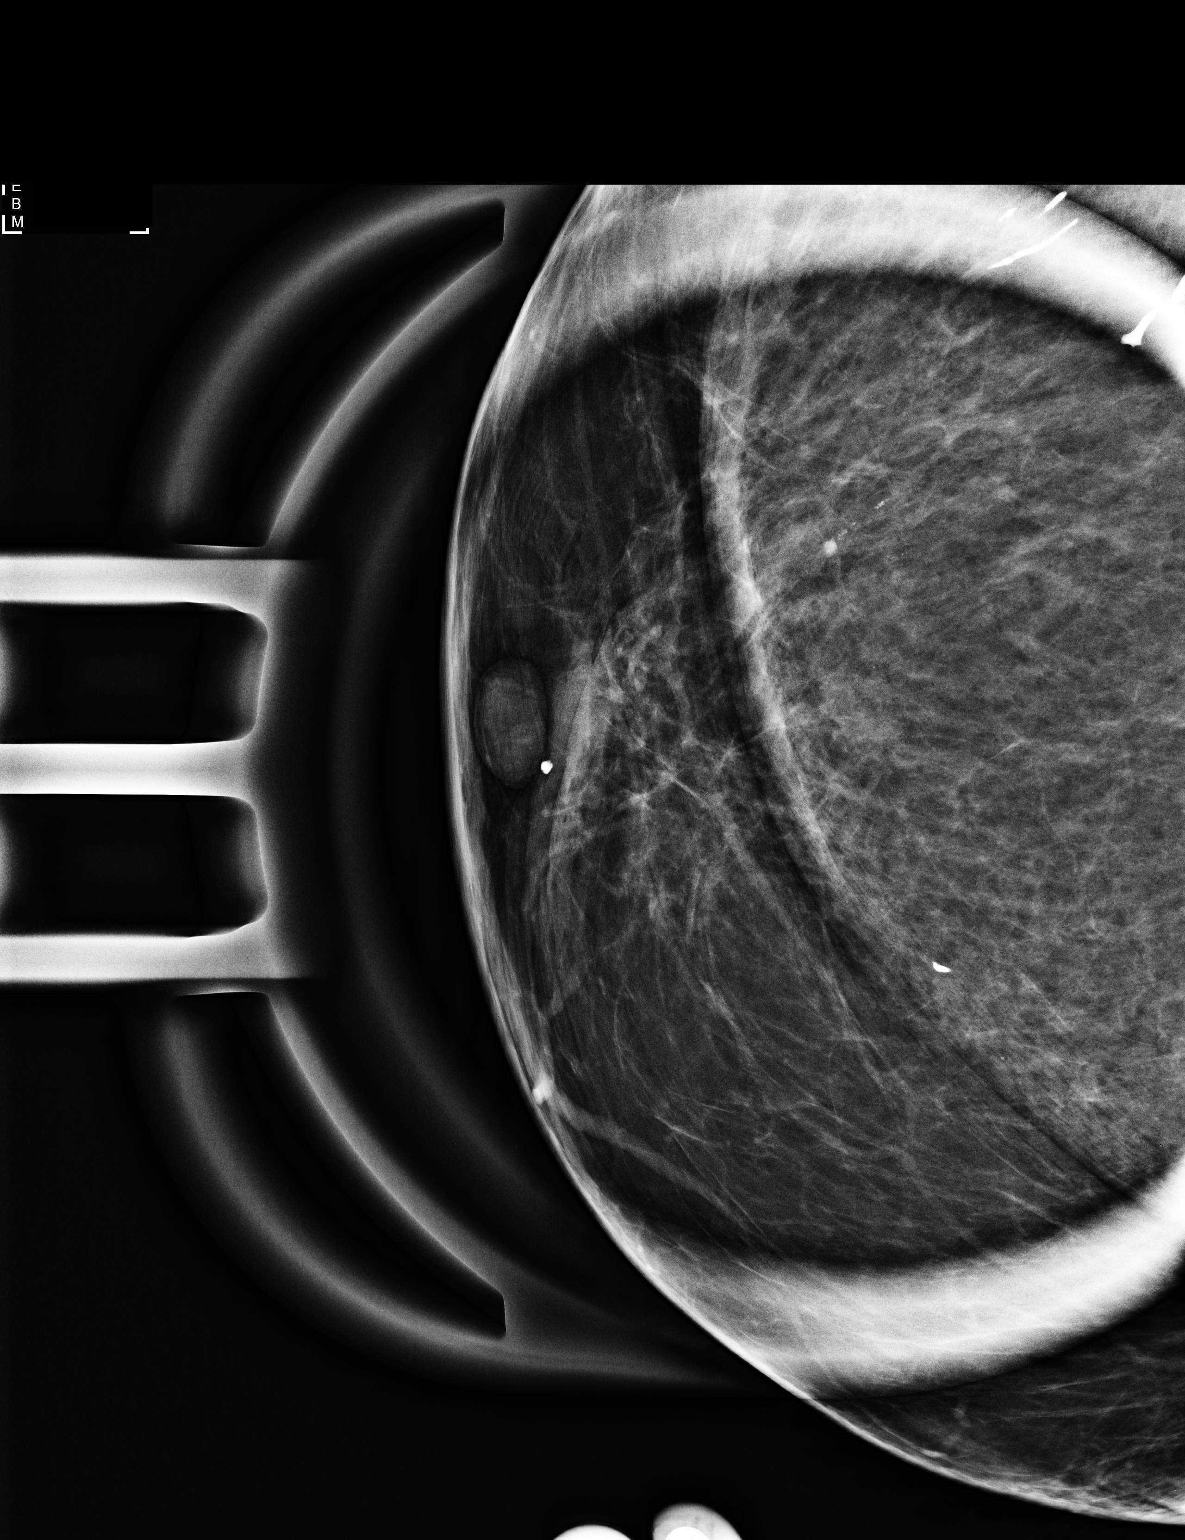

[R ML (1 of 2)]
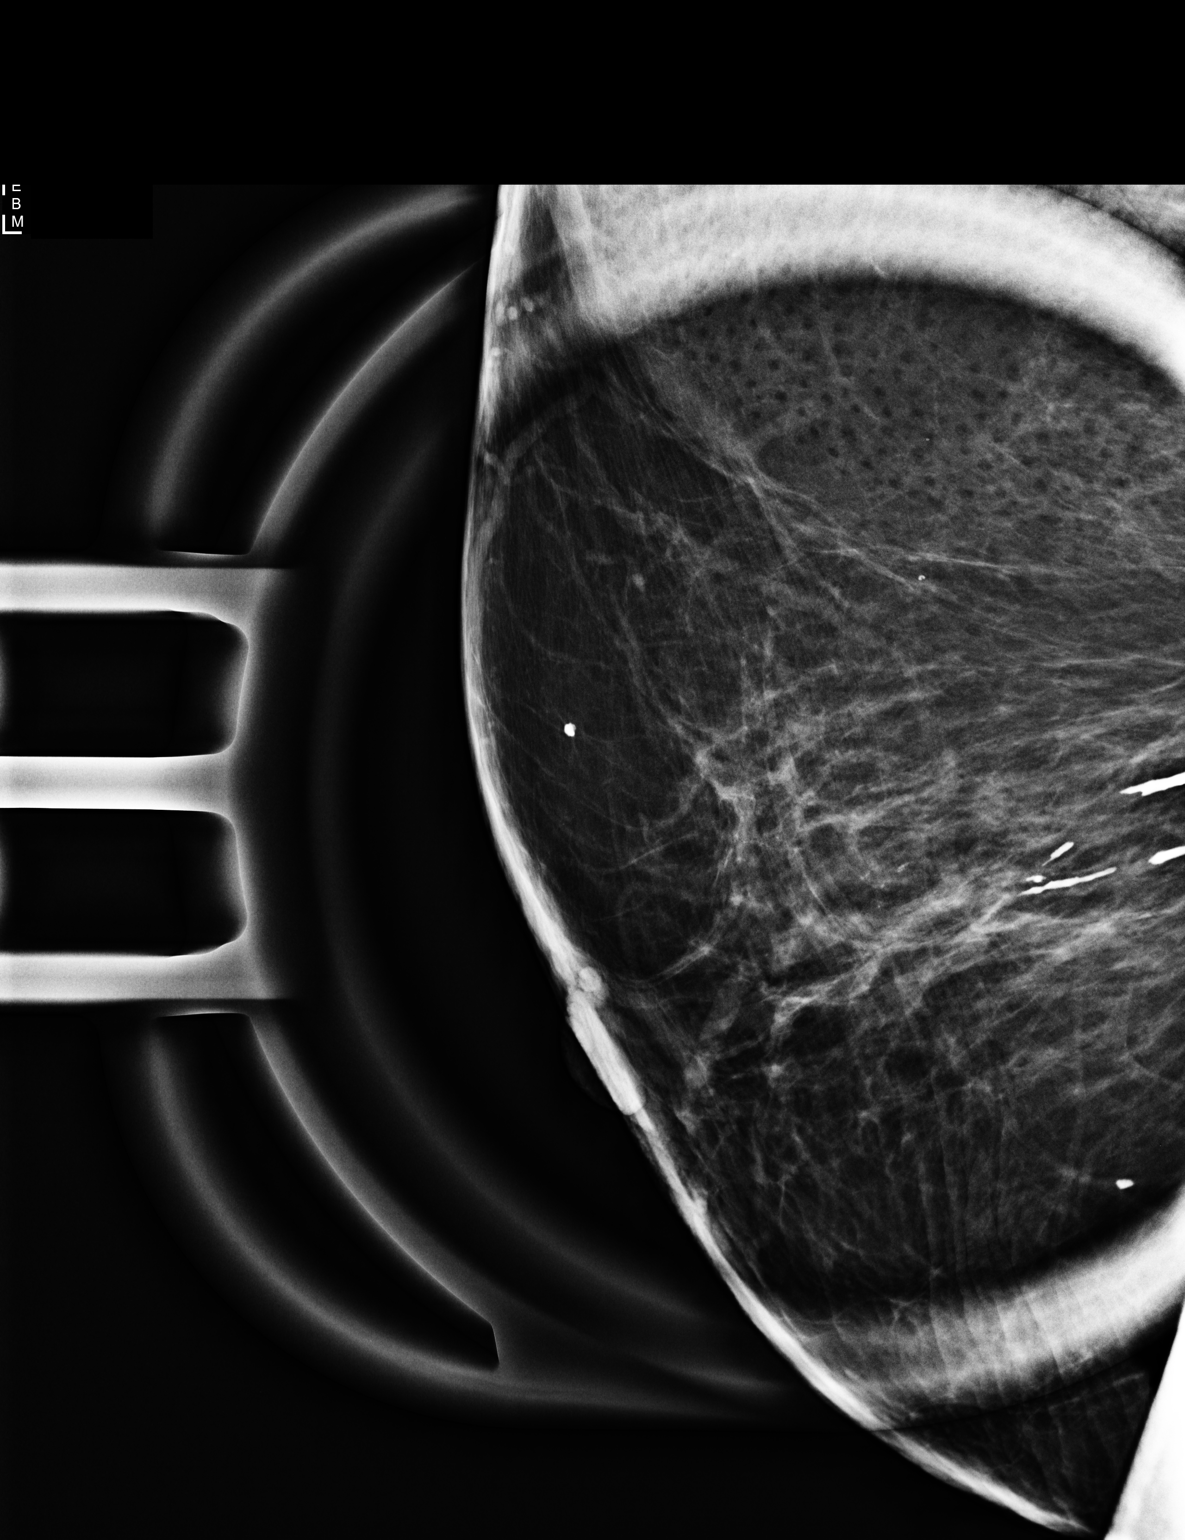

[R ML (2 of 2)]
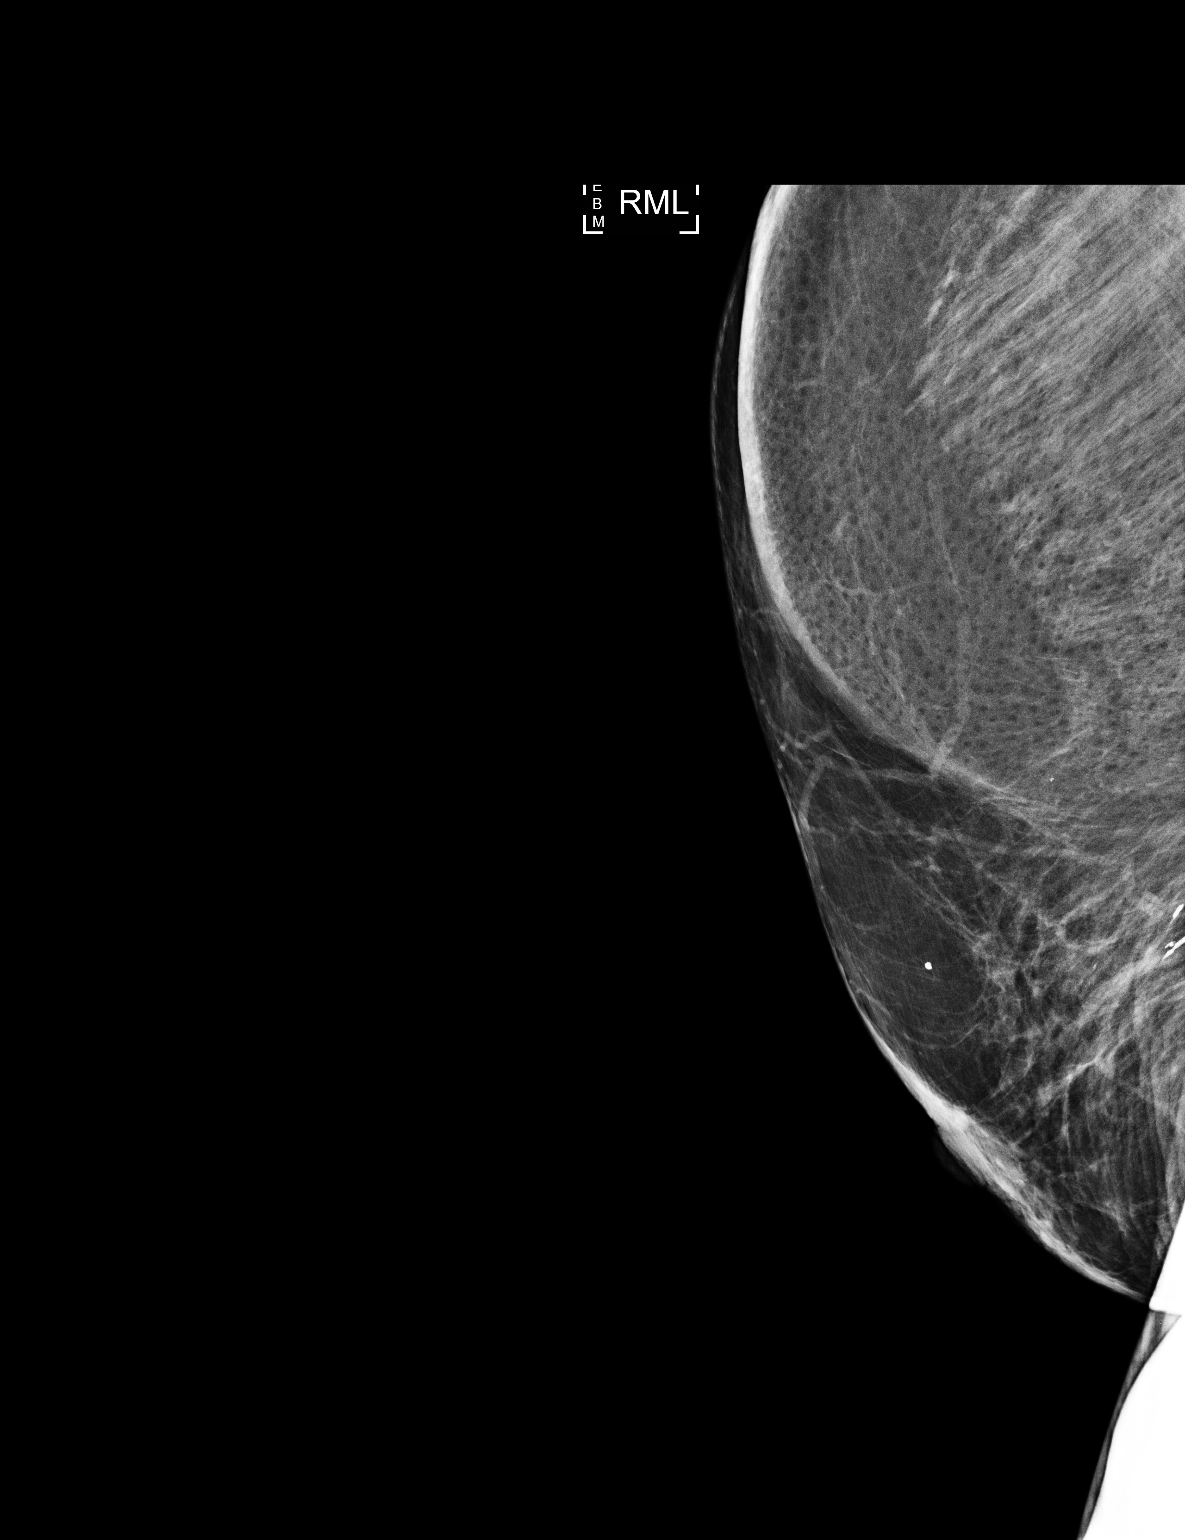

[3 of 3 positions shown; findings below may reference images not displayed]

ACR Breast Density Category b: There are scattered areas of
fibroglandular density.
FINDINGS: Spot 2D magnification and full field 2D mL views of the right breast
were performed. There is persistence of grouped faint pleomorphic
calcifications with linear forms in the upper slightly outer right
breast spanning up to 0.6 cm. No suspicious associated mass. No
additional new findings elsewhere in the right breast.
IMPRESSION: Indeterminate calcifications in the right breast spanning 0.6 cm.

RECOMMENDATION:
Stereotactic core needle biopsy of the right breast calcifications.

I have discussed the findings and recommendations with the patient
who agrees to proceed with biopsy. The patient will be scheduled for
biopsy prior to leaving the office today.

BI-RADS CATEGORY  4: Suspicious.

## 2022-11-09 DIAGNOSIS — G43809 Other migraine, not intractable, without status migrainosus: Secondary | ICD-10-CM | POA: Diagnosis not present

## 2022-11-10 ENCOUNTER — Ambulatory Visit
Admission: RE | Admit: 2022-11-10 | Discharge: 2022-11-10 | Disposition: A | Discharge status: Home / Self Care | Payer: Medicare Other | Source: Ambulatory Visit | Attending: Family Medicine | Admitting: Family Medicine

## 2022-11-10 DIAGNOSIS — Z1231 Encounter for screening mammogram for malignant neoplasm of breast: Secondary | ICD-10-CM

## 2022-11-20 ENCOUNTER — Ambulatory Visit (INDEPENDENT_AMBULATORY_CARE_PROVIDER_SITE_OTHER): Payer: Medicare Other | Admitting: Family Medicine

## 2022-11-20 ENCOUNTER — Encounter: Payer: Self-pay | Admitting: Family Medicine

## 2022-11-20 VITALS — BP 128/74 | HR 113 | Temp 97.8°F | Ht 65.25 in | Wt 189.0 lb

## 2022-11-20 DIAGNOSIS — R000 Tachycardia, unspecified: Secondary | ICD-10-CM

## 2022-11-20 DIAGNOSIS — E7849 Other hyperlipidemia: Secondary | ICD-10-CM | POA: Diagnosis not present

## 2022-11-20 DIAGNOSIS — R350 Frequency of micturition: Secondary | ICD-10-CM

## 2022-11-20 DIAGNOSIS — N3000 Acute cystitis without hematuria: Secondary | ICD-10-CM | POA: Diagnosis not present

## 2022-11-20 LAB — POC URINALSYSI DIPSTICK (AUTOMATED)
Spec Grav, UA: 1.025 (ref 1.010–1.025)
pH, UA: 5.5 (ref 5.0–8.0)

## 2022-11-20 LAB — POCT UA - MICROSCOPIC ONLY: RBC, Urine, Miroscopic: 0 (ref 0–2)

## 2022-11-20 LAB — CBC WITH DIFFERENTIAL/PLATELET
Eosinophils Absolute: 172 cells/uL (ref 15–500)
Lymphs Abs: 2646 cells/uL (ref 850–3900)
MCH: 29.7 pg (ref 27.0–33.0)
MCHC: 33.6 g/dL (ref 32.0–36.0)

## 2022-11-20 MED ORDER — METOPROLOL TARTRATE 25 MG PO TABS: 25.0000 mg | ORAL_TABLET | Freq: Two times a day (BID) | ORAL | 1 refills | Status: DC | PRN

## 2022-11-20 MED ORDER — CEPHALEXIN 500 MG PO CAPS
500.0000 mg | ORAL_CAPSULE | Freq: Two times a day (BID) | ORAL | 0 refills | Status: DC
Start: 2022-11-20 — End: 2023-01-27

## 2022-11-20 NOTE — Assessment & Plan Note (Signed)
Acute on chronic  HR in low one teens today  EKG   Has had metoprolol to take prn in past  Refilled this  Discussed ER precautions  Labs ordered  Consider ref to cardiology  Has h/o high cholesterol and does not tolerate statin

## 2022-11-20 NOTE — Progress Notes (Unsigned)
Subjective:    Patient ID: Ashley Rasmussen, female    DOB: [birth year], 72 y.o.   MRN: [id number]  HPI Pt presents with urinary symptoms  Tachycardia  Hyperlipiemia   Wt Readings from Last 3 Encounters:  11/20/22 189 lb (85.7 kg)  04/30/22 189 lb 4 oz (85.8 kg)  10/08/21 188 lb (85.3 kg)   31.21 kg/m Vitals:   11/20/22 1358  BP: 128/74  Pulse: (!) 113  Temp: 97.8 F (36.6 C)  SpO2: 95%   About 3 days  Urgency to urinate  Frequency  No burning but does have urinary pressure   Azo has helped some otc   No blood in urine  No odor  No nausea No fever   No flank pain   Results for orders placed or performed in visit on 11/20/22  POCT Urinalysis Dipstick (Automated)  Result Value Ref Range   Color, UA Orange    Clarity, UA Hazy    Glucose, UA     Bilirubin, UA     Ketones, UA     Spec Grav, UA 1.025 1.010 - 1.025   Blood, UA     pH, UA 5.5 5.0 - 8.0   Protein, UA     Urobilinogen, UA     Nitrite, UA     Leukocytes, UA Moderate (2+) (A) Negative  POCT UA - Microscopic Only  Result Value Ref Range   WBC, Ur, HPF, POC 0-3 0 - 5   RBC, Urine, Miroscopic 0 0 - 2   Bacteria, U Microscopic many None - Trace   Mucus, UA few    Epithelial cells, urine per micros few    Crystals, Ur, HPF, POC none    Casts, Ur, LPF, POC none    Yeast, UA none      HR is fast today  Notes this on and off  Palpitations (not skipping, just feels fast) Can vary with eating  Mostly when she drinks caffeine  Worse when she feels anxious (this is not often) -mood is good  Wakes up and feels it "in there throat"   Has never been to cardiology    No cp   Lab Results  Component Value Date   WBC 7.5 04/24/2022   HGB 14.2 04/24/2022   HCT 42.3 04/24/2022   MCV 88.8 04/24/2022   PLT 223.0 04/24/2022    Pulse Readings from Last 3 Encounters:  11/20/22 (!) 113  04/30/22 (!) 110  09/17/21 64    BP Readings from Last 3 Encounters:  11/20/22 128/74  04/30/22 136/82   10/08/21 128/80    Hyperlipidemia  Zetia gave her neuropathy symptoms Stopped it  Intol of statin  Lab Results  Component Value Date   CHOL 248 (H) 04/24/2022   HDL 44.00 04/24/2022   LDLCALC 171 (H) 04/24/2022   LDLDIRECT 153.0 11/24/2016   TRIG 164.0 (H) 04/24/2022   CHOLHDL 6 04/24/2022   The 10-year ASCVD risk score (Arnett DK, et al., 2019) is: 12.6%   Values used to calculate the score:     Age: 20 years     Sex: Female     Is Non-Hispanic African American: No     Diabetic: No     Tobacco smoker: No     Systolic Blood Pressure: 128 mmHg     Is BP treated: No     HDL Cholesterol: 44 mg/dL     Total Cholesterol: 248 mg/dL    Review of Systems  Objective:   Physical Exam Constitutional:      General: She is not in acute distress.    Appearance: Normal appearance. She is well-developed. She is obese. She is not ill-appearing or diaphoretic.  HENT:     Head: Normocephalic and atraumatic.  Eyes:     Conjunctiva/sclera: Conjunctivae normal.     Pupils: Pupils are equal, round, and reactive to light.  Neck:     Thyroid: No thyromegaly.     Vascular: No carotid bruit or JVD.  Cardiovascular:     Rate and Rhythm: Regular rhythm. Tachycardia present.     Heart sounds: Normal heart sounds.     No gallop.  Pulmonary:     Effort: Pulmonary effort is normal. No respiratory distress.     Breath sounds: Normal breath sounds. No wheezing or rales.  Abdominal:     General: There is no distension or abdominal bruit.     Palpations: Abdomen is soft. There is no mass.     Tenderness: There is no abdominal tenderness. There is no right CVA tenderness or left CVA tenderness.     Comments: No suprapubic tenderness or fullness  No cva tenderness   Musculoskeletal:     Cervical back: Normal range of motion and neck supple.     Right lower leg: No edema.     Left lower leg: No edema.  Lymphadenopathy:     Cervical: No cervical adenopathy.  Skin:    General: Skin is  warm and dry.     Coloration: Skin is not pale.     Findings: No rash.  Neurological:     Mental Status: She is alert.     Coordination: Coordination normal.     Deep Tendon Reflexes: Reflexes are normal and symmetric. Reflexes normal.  Psychiatric:        Mood and Affect: Mood normal.           Assessment & Plan:  . Problem List Items Addressed This Visit       Genitourinary   Acute cystitis - Primary    Ua -pos for leuk and bacteria Cx pending  Px keflex  Inst to call if symptoms suddenly worsen  Encourage fluid intake  Using azo       Relevant Orders   POCT UA - Microscopic Only (Completed)   Urine Culture     Other   Hyperlipidemia    Intolerant of statin  Now intol of zetia gave her neuropathy symptoms    Disc goals for lipids and reasons to control them Rev last labs with pt Rev low sat fat diet in detail Consider cardiology ref ASCVD score 12.6 %         Relevant Medications   metoprolol tartrate (LOPRESSOR) 25 MG tablet   Tachycardia    Acute on chronic  HR in low one teens today  EKG   Has had metoprolol to take prn in past  Refilled this  Discussed ER precautions  Labs ordered  Consider ref to cardiology  Has h/o high cholesterol and does not tolerate statin      Relevant Orders   EKG 12-Lead (Completed)   CBC with Differential/Platelet (Completed)   Comprehensive metabolic panel (Completed)   TSH (Completed)

## 2022-11-20 NOTE — Patient Instructions (Signed)
Drink water   Will treat based on results of urine test and /or culture   If any severe symptoms call and go to the ER   EKG and labs today for fast heart rate  Take metoprolol if needed  (if pulse is over 95)   We may refer you cardiology   Avoid caffeine

## 2022-11-20 NOTE — Assessment & Plan Note (Signed)
Ua -pos for leuk and bacteria Cx pending  Px keflex  Inst to call if symptoms suddenly worsen  Encourage fluid intake  Using azo

## 2022-11-20 NOTE — Assessment & Plan Note (Addendum)
Intolerant of statin  Now intol of zetia gave her neuropathy symptoms    Disc goals for lipids and reasons to control them Rev last labs with pt Rev low sat fat diet in detail Consider cardiology ref ASCVD score 12.6 %

## 2022-11-21 LAB — CBC WITH DIFFERENTIAL/PLATELET
Absolute Monocytes: 778 cells/uL (ref 200–950)
Basophils Absolute: 40 cells/uL (ref 0–200)
Basophils Relative: 0.4 %
Eosinophils Relative: 1.7 %
HCT: 44.4 % (ref 35.0–45.0)
Hemoglobin: 14.9 g/dL (ref 11.7–15.5)
MCV: 88.4 fL (ref 80.0–100.0)
MPV: 10.9 fL (ref 7.5–12.5)
Monocytes Relative: 7.7 %
Neutro Abs: 6464 cells/uL (ref 1500–7800)
Neutrophils Relative %: 64 %
Platelets: 271 10*3/uL (ref 140–400)
RBC: 5.02 10*6/uL (ref 3.80–5.10)
RDW: 12.8 % (ref 11.0–15.0)
Total Lymphocyte: 26.2 %
WBC: 10.1 10*3/uL (ref 3.8–10.8)

## 2022-11-21 LAB — COMPREHENSIVE METABOLIC PANEL
AG Ratio: 2 (calc) (ref 1.0–2.5)
ALT: 11 U/L (ref 6–29)
AST: 12 U/L (ref 10–35)
Albumin: 4.2 g/dL (ref 3.6–5.1)
Alkaline phosphatase (APISO): 105 U/L (ref 37–153)
BUN: 18 mg/dL (ref 7–25)
CO2: 23 mmol/L (ref 20–32)
Calcium: 9.8 mg/dL (ref 8.6–10.4)
Chloride: 104 mmol/L (ref 98–110)
Creat: 0.89 mg/dL (ref 0.60–1.00)
Globulin: 2.1 g/dL (calc) (ref 1.9–3.7)
Glucose, Bld: 95 mg/dL (ref 65–99)
Potassium: 3.8 mmol/L (ref 3.5–5.3)
Sodium: 141 mmol/L (ref 135–146)
Total Bilirubin: 0.5 mg/dL (ref 0.2–1.2)
Total Protein: 6.3 g/dL (ref 6.1–8.1)

## 2022-11-21 LAB — TSH: TSH: 1.07 mIU/L (ref 0.40–4.50)

## 2022-11-23 LAB — URINE CULTURE
MICRO NUMBER:: 14879763
SPECIMEN QUALITY:: ADEQUATE

## 2022-12-09 ENCOUNTER — Ambulatory Visit
Admission: RE | Admit: 2022-12-09 | Discharge: 2022-12-09 | Disposition: A | Discharge status: Home / Self Care | Payer: Medicare Other | Source: Ambulatory Visit | Attending: General Surgery | Admitting: General Surgery

## 2022-12-09 DIAGNOSIS — Z853 Personal history of malignant neoplasm of breast: Secondary | ICD-10-CM | POA: Diagnosis not present

## 2022-12-09 DIAGNOSIS — D0511 Intraductal carcinoma in situ of right breast: Secondary | ICD-10-CM

## 2022-12-09 DIAGNOSIS — Z803 Family history of malignant neoplasm of breast: Secondary | ICD-10-CM | POA: Diagnosis not present

## 2022-12-09 MED ORDER — GADOPICLENOL 0.5 MMOL/ML IV SOLN
8.0000 mL | Freq: Once | INTRAVENOUS | Status: AC | PRN
  Administered 2022-12-09: 8 mL via INTRAVENOUS

## 2023-01-04 DIAGNOSIS — H90A21 Sensorineural hearing loss, unilateral, right ear, with restricted hearing on the contralateral side: Secondary | ICD-10-CM | POA: Diagnosis not present

## 2023-01-04 DIAGNOSIS — H90A32 Mixed conductive and sensorineural hearing loss, unilateral, left ear with restricted hearing on the contralateral side: Secondary | ICD-10-CM | POA: Diagnosis not present

## 2023-01-04 DIAGNOSIS — H7192 Unspecified cholesteatoma, left ear: Secondary | ICD-10-CM | POA: Diagnosis not present

## 2023-01-27 ENCOUNTER — Ambulatory Visit (INDEPENDENT_AMBULATORY_CARE_PROVIDER_SITE_OTHER): Payer: Medicare Other

## 2023-01-27 ENCOUNTER — Encounter: Payer: Self-pay | Admitting: Internal Medicine

## 2023-01-27 ENCOUNTER — Ambulatory Visit: Payer: Medicare Other | Attending: Internal Medicine | Admitting: Internal Medicine

## 2023-01-27 VITALS — BP 144/90 | HR 107 | Ht 66.0 in | Wt 188.5 lb

## 2023-01-27 DIAGNOSIS — R000 Tachycardia, unspecified: Secondary | ICD-10-CM | POA: Insufficient documentation

## 2023-01-27 DIAGNOSIS — R55 Syncope and collapse: Secondary | ICD-10-CM

## 2023-01-27 DIAGNOSIS — R002 Palpitations: Secondary | ICD-10-CM | POA: Insufficient documentation

## 2023-01-27 DIAGNOSIS — R030 Elevated blood-pressure reading, without diagnosis of hypertension: Secondary | ICD-10-CM | POA: Diagnosis not present

## 2023-01-27 NOTE — Patient Instructions (Signed)
Medication Instructions:  Your physician recommends that you continue on your current medications as directed. Please refer to the Current Medication list given to you today.  *If you need a refill on your cardiac medications before your next appointment, please call your pharmacy*   Lab Work: none If you have labs (blood work) drawn today and your tests are completely normal, you will receive your results only by: MyChart Message (if you have MyChart) OR A paper copy in the mail If you have any lab test that is abnormal or we need to change your treatment, we will call you to review the results.   Testing/Procedures: Your physician has requested that you have an echocardiogram. Echocardiography is a painless test that uses sound waves to create images of your heart. It provides your doctor with information about the size and shape of your heart and how well your heart's chambers and valves are working.   You may receive an ultrasound enhancing agent through an IV if needed to better visualize your heart during the echo. This procedure takes approximately one hour.  There are no restrictions for this procedure.  This will take place at [address] [address], [place] [address]    Your physician has recommended that you wear a Zio monitor 14 days.   This monitor is a medical device that records the heart's electrical activity. Doctors most often use these monitors to diagnose arrhythmias. Arrhythmias are problems with the speed or rhythm of the heartbeat. The monitor is a small device applied to your chest. You can wear one while you do your normal daily activities. While wearing this monitor if you have any symptoms to push the button and record what you felt. Once you have worn this monitor for the period of time provider prescribed (Usually 14 days), you will return the monitor device in the postage paid box. Once it is returned they will download the data  collected and provide [place] with a report which the provider will then review and we will call you with those results. Important tips:  Avoid showering during the first 24 hours of wearing the monitor. Avoid excessive sweating to help maximize wear time. Do not submerge the device, no hot tubs, and no swimming pools. Keep any lotions or oils away from the patch. After 24 hours you may shower with the patch on. Take brief showers with your back facing the shower head.  Do not remove patch once it has been placed because that will interrupt data and decrease adhesive wear time. Push the button when you have any symptoms and write down what you were feeling. Once you have completed wearing your monitor, remove and place into box which has postage paid and place in your outgoing mailbox.  If for some reason you have misplaced your box then call our office and we can provide another box and/or mail it off for you.      Follow-Up: At [facility], you and your health needs are our priority.  As part of our continuing mission to provide you with exceptional heart care, we have created designated Provider Care Teams.  These Care Teams include your primary Cardiologist (physician) and Advanced Practice Providers (APPs -  Physician Assistants and Nurse Practitioners) who all work together to provide you with the care you need, when you need it.  We recommend signing up for the patient portal called "MyChart".  Sign up information is provided on this After Visit Summary.  MyChart is used to connect with patients for Virtual Visits (Telemedicine).  Patients are able to view lab/test results, encounter notes, upcoming appointments, etc.  Non-urgent messages can be sent to your provider as well.   To learn more about what you can do with MyChart, go to [email or web address].    Your next appointment:   6 week(s)  Provider:   You may see Yvonne Kendall, MD or one of the following Advanced Practice  Providers on your designated Care Team:   Nicolasa Ducking, NP Eula Listen, PA-C Cadence Fransico Michael, PA-C Charlsie Quest, NP

## 2023-01-27 NOTE — Progress Notes (Signed)
Cardiology Office Note:  .   Date:  01/27/2023  ID:  Ashley Rasmussen, DOB 1951-06-25, MRN [id number] PCP: Judy Pimple, MD  [facility] Providers Cardiologist:  New  History of Present Illness: .   Ashley Rasmussen is a 72 y.o. female with history of hyperlipidemia and breast cancer, whom we have been asked to see for evaluation of tachycardia by Dr. Milinda Antis.  Ashley Rasmussen has a long history of mildly elevated heart rates.  However, over the last few weeks, she has developed intermittent episodes during which it feels like her heart is pounding harder and faster than usual.  These episodes last a few minutes at a time and are associated with a lightheaded/faint feeling that is different from the vertigo that she has experienced in the past.  She has never passed out.  She denies chest pain, shortness of breath, and edema.  She underwent myocardial perfusion stress testing in 2018 due to chest symptom in the setting of a respiratory infection; this was normal.  At her recent visit with Dr. Milinda Antis, she was given a prescription for metoprolol 25 mg to be taken as needed for palpitations.  She has used this several times when she feels the palpitations coming on and notes that it helps.  She typically avoids caffeine but drinks a number of refresher drinks from [place].  She reports having received chemotherapy at the time of her initial breast cancer diagnosis more than a decade ago.  ROS: See HPI.  Studies Reviewed: Marland Kitchen   EKG Interpretation Date/Time:  Wednesday January 27 2023 09:43:52 EDT Ventricular Rate:  107 PR Interval:  150 QRS Duration:  80 QT Interval:  330 QTC Calculation: 440 R Axis:   53  Text Interpretation: Sinus tachycardia Right atrial enlargement Minimal voltage criteria for LVH, may be normal variant ( Sokolow-Lyon ) Nonspecific ST and T wave abnormality When compared with ECG of 20-Nov-2022 No significant change was found Confirmed by Reshma Hoey [id number]) on 01/27/2023 9:49:01  AM    Pharmacologic MPI (09/30/2016): Normal study without evidence of ischemia or infarct.  LVEF greater than 65%.  Risk Assessment/Calculations:         Physical Exam:   VS:  BP (!) 144/90 (BP Location: Left Arm, Patient Position: Sitting, Cuff Size: Large)   Pulse (!) 107   Ht 5\' 6"  (1.676 m)   Wt 188 lb 8 oz (85.5 kg)   SpO2 96%   BMI 30.42 kg/m    Wt Readings from Last 3 Encounters:  01/27/23 188 lb 8 oz (85.5 kg)  11/20/22 189 lb (85.7 kg)  04/30/22 189 lb 4 oz (85.8 kg)    GEN: Well nourished, well developed in no acute distress NECK: No JVD; No carotid bruits CARDIAC: Tachycardic but regular without murmurs, rubs, or gallops. RESPIRATORY:  Clear to auscultation without rales, wheezing or rhonchi  ABDOMEN: Soft, non-tender, non-distended EXTREMITIES:  No edema; No deformity   ASSESSMENT AND PLAN: .    Palpitations, near-syncope, and sinus tachycardia: Review of Ashley Rasmussen vital signs is notable for upper normal to mildly elevated heart rates dating back at least 5 years.  However, she reports more pronounced episodic palpitations with associated near syncope over the last few months.  These have improved with as needed use of metoprolol.  She is experiencing them about once a week.  Her EKG today shows sinus tachycardia with right atrial enlargement as well as possible LVH.  We have agreed to obtain an echocardiogram and  14-day event monitor (ZIO XT) for further evaluation.  Labs drawn this spring, including CBC, CMP, and TSH, were unrevealing.  I have encouraged Ashley Rasmussen to minimize her caffeine intake.  She should continue using as needed metoprolol.  Based on results of the event monitor and frequency of symptoms, we will consider standing beta-blocker therapy in the future.  Elevated blood pressure: Blood pressure mildly elevated today, though Ashley Rasmussen does not carry a diagnosis of hypertension.  Blood pressures have been better at visits with other providers over the  last year.  We will reevaluate her blood pressure at follow-up and determine if pharmacotherapy is needed.    Dispo: Return to clinic in ~6 weeks.  Signed, Yvonne Kendall, MD

## 2023-01-30 DIAGNOSIS — R000 Tachycardia, unspecified: Secondary | ICD-10-CM | POA: Diagnosis not present

## 2023-01-30 DIAGNOSIS — R55 Syncope and collapse: Secondary | ICD-10-CM

## 2023-02-01 ENCOUNTER — Other Ambulatory Visit: Payer: Self-pay | Admitting: Family Medicine

## 2023-02-01 DIAGNOSIS — L292 Pruritus vulvae: Secondary | ICD-10-CM

## 2023-02-01 DIAGNOSIS — N904 Leukoplakia of vulva: Secondary | ICD-10-CM

## 2023-02-01 MED ORDER — CLOBETASOL PROPIONATE 0.05 % EX OINT: TOPICAL_OINTMENT | CUTANEOUS | 3 refills | Status: DC

## 2023-02-01 NOTE — Telephone Encounter (Signed)
Last filled on 10/14/21 # 30 g with 5 refills, last OV was a ?UTI on 11/20/22

## 2023-02-01 NOTE — Telephone Encounter (Signed)
Prescription Request  02/01/2023  LOV: 11/20/2022  What is the name of the medication or equipment?  clobetasol ointment (TEMOVATE) 0.05 %   Have you contacted your pharmacy to request a refill? Yes   Which pharmacy would you like this sent to?   [id number]ann Sheen, [place] - [address] [place] [address] Phone: [phone] Fax: [phone]     Patient notified that their request is being sent to the clinical staff for review and that they should receive a response within 2 business days.   Please advise at Home [phone]

## 2023-02-17 DIAGNOSIS — R000 Tachycardia, unspecified: Secondary | ICD-10-CM | POA: Diagnosis not present

## 2023-02-17 DIAGNOSIS — R55 Syncope and collapse: Secondary | ICD-10-CM | POA: Diagnosis not present

## 2023-02-18 ENCOUNTER — Ambulatory Visit: Payer: Medicare Other | Attending: Internal Medicine

## 2023-02-18 DIAGNOSIS — R55 Syncope and collapse: Secondary | ICD-10-CM | POA: Diagnosis not present

## 2023-02-18 DIAGNOSIS — R000 Tachycardia, unspecified: Secondary | ICD-10-CM | POA: Insufficient documentation

## 2023-02-18 LAB — ECHOCARDIOGRAM COMPLETE
Area-P 1/2: 4.31 cm2
S' Lateral: 2.1 cm

## 2023-02-19 ENCOUNTER — Other Ambulatory Visit: Payer: Self-pay

## 2023-02-19 DIAGNOSIS — I480 Paroxysmal atrial fibrillation: Secondary | ICD-10-CM

## 2023-02-22 NOTE — Progress Notes (Unsigned)
Cardiology Office Note Date:  02/23/2023  Patient ID:  Ashley Rasmussen, Ashley Rasmussen 05-10-1951, MRN [id number] PCP:  Judy Pimple, MD  Cardiologist:  Yvonne Kendall, MD Electrophysiologist: Lanier Prude, MD (though has not met)    Chief Complaint: tachycardia, palpitations  History of Present Illness: Ashley Rasmussen is a 72 y.o. female with PMH notable for Breast Ca, HLD, tachycardia; seen today for Lanier Prude, MD for evaluation of SVT found on recent ambulatory monitor.  She was seen by Dr. Okey Dupre 01/2023 for her palpitation, tachycardia. She complained of episodes where her heart was pounding harder and faster in chest, neck and sometimes in ears. These were sometimes associated with lightheaded/faint feeling. He ordered an ambulatory monitor that showed episodes of SVT and some concern for AFib. Lopressor PRN continued.   On follow-up today, she continues to have palpitation episodes where she also feels lightheaded and dizzy. No syncope, no chest pain or SOB during episodes. She has palpitations frequently, and when they are accompanied by lightheadedness, she takes PRN lopressor - about 1-2/week.  She does not ingest caffeine, stays active with grandchildren.    Past Medical History:  Diagnosis Date   Allergic rhinitis, cause unspecified    Breast cancer (HCC) 1992   Complication of anesthesia    Family history of breast cancer    Family history of lung cancer    HLD (hyperlipidemia)    Osteopenia    Osteoporosis, unspecified    Personal history of chemotherapy    Personal history of malignant neoplasm of breast    needs yearly mammogram and MRI every 2   Personal history of radiation therapy    PONV (postoperative nausea and vomiting)    Thoracic or lumbosacral neuritis or radiculitis, unspecified     Past Surgical History:  Procedure Laterality Date   BREAST BIOPSY Left 03/2015   BREAST BIOPSY Right 10/09/2020   Breast cancer excision     BREAST LUMPECTOMY Right     COLONOSCOPY WITH PROPOFOL N/A 04/11/2018   Procedure: COLONOSCOPY WITH PROPOFOL;  Surgeon: Wyline Mood, MD;  Location: [facility] ENDOSCOPY;  Service: Gastroenterology;  Laterality: N/A;   LATISSIMUS FLAP TO BREAST     due to radiation burn   MASTECTOMY Right 10/30/2020   TOTAL MASTECTOMY Right 10/30/2020   Procedure: RIGHT MASTECTOMY;  Surgeon: Griselda Miner, MD;  Location: [facility];  Service: General;  Laterality: Right;    Current Outpatient Medications  Medication Instructions   acetaminophen (TYLENOL) 500 mg, Oral, Every 6 hours PRN   clobetasol ointment (TEMOVATE) 0.05 % Apply to affected area every night for 4 weeks, then every other day for 4 weeks and then twice a week for 4 weeks or until resolution.   metoprolol succinate (TOPROL XL) 25 mg, Oral, Daily   metoprolol tartrate (LOPRESSOR) 25 MG tablet Take 1 tablet by mouth twice daily as needed for Heart Rate greater than 120.   Multiple Vitamin (MULTIVITAMIN) tablet 3 tablets, Oral, Daily   omeprazole (PRILOSEC) 20 MG capsule TAKE 1 CAPSULE DAILY AS NEEDED    Social History:  The patient  reports that she quit smoking about 33 years ago. Her smoking use included cigarettes. She started smoking about 35 years ago. She has a 0.2 pack-year smoking history. She has never used smokeless tobacco. She reports that she does not drink alcohol and does not use drugs.   Family History:  The patient's family history includes Alcoholism in her father; Breast cancer in her  cousin; Breast cancer (age of onset: 32) in her sister; Breast cancer (age of onset: 109) in her cousin; Breast cancer (age of onset: 41) in her mother; Cancer in her paternal aunt and paternal aunt; Cancer (age of onset: 73) in her nephew; Lung cancer in her maternal uncle; Melanoma in her mother; Myelodysplastic syndrome in her mother; Osteoporosis in her mother and sister; Other in her mother and sister.  ROS:  Please see the history of present illness. All other  systems are reviewed and otherwise negative.   PHYSICAL EXAM:  VS:  BP 124/74 (BP Location: Left Arm, Patient Position: Sitting, Cuff Size: Large)   Pulse (!) 110   Ht 5\' 6"  (1.676 m)   Wt 190 lb 6 oz (86.4 kg)   SpO2 98%   BMI 30.73 kg/m  BMI: Body mass index is 30.73 kg/m.  GEN- The patient is well appearing, alert and oriented x 3 today.   Lungs- Clear to ausculation bilaterally, normal work of breathing.  Heart- Regular, tachycardic rate and rhythm, no murmurs, rubs or gallops Extremities- No peripheral edema, warm, dry   EKG is ordered. Personal review of EKG from today shows:    EKG Interpretation Date/Time:  Tuesday February 23 2023 13:17:58 EDT Ventricular Rate:  110 PR Interval:  166 QRS Duration:  84 QT Interval:  328 QTC Calculation: 443 R Axis:   51  Text Interpretation: Sinus tachycardia Right atrial enlargement Nonspecific ST and T wave abnormality When compared with ECG of 27-Jan-2023 09:43, No significant change was found Confirmed by Sherie Don [phone]) on 02/23/2023 1:46:38 PM    Recent Labs: 11/20/2022: ALT 11; BUN 18; Creat 0.89; Hemoglobin 14.9; Platelets 271; Potassium 3.8; Sodium 141; TSH 1.07  04/24/2022: Cholesterol 248; HDL 44.00; LDL Cholesterol 171; Total CHOL/HDL Ratio 6; Triglycerides 164.0; VLDL 32.8   CrCl cannot be calculated (Patient's most recent lab result is older than the maximum 21 days allowed.).   Wt Readings from Last 3 Encounters:  02/23/23 190 lb 6 oz (86.4 kg)  01/27/23 188 lb 8 oz (85.5 kg)  11/20/22 189 lb (85.7 kg)     Additional studies reviewed include: Previous EP, cardiology notes.   TTE, 02/18/2023  1. Left ventricular ejection fraction, by estimation, is 60 to 65%. The left ventricle has normal function. The left ventricle has no regional wall motion abnormalities. Left ventricular diastolic parameters are consistent with Grade I diastolic dysfunction (impaired relaxation). The average left ventricular global longitudinal  strain is -24.8 %.   2. Right ventricular systolic function is normal. The right ventricular size is normal.   3. The mitral valve is normal in structure. No evidence of mitral valve regurgitation. No evidence of mitral stenosis.   4. The aortic valve is normal in structure. Aortic valve regurgitation is not visualized. No aortic stenosis is present.   5. The inferior vena cava is normal in size with greater than 50% respiratory variability, suggesting right atrial pressure of 3 mmHg.   Long Term monitor, 02/18/2023   The patient was monitored for 7 days, 16 hours.   The predominant rhythm was sinus with an average rate of 81 bpm (range 31-144 bpm in sinus).   There were rare PACs and PVCs.   62 supraventricular runs were observed, lasting up to 1 minute, 46 seconds with a maximum rate of 231 bpm.  The longest episode could represent atrial fibrillation, as the tachyarrhythmia appears irregular.   No prolonged pause was observed.   Patient triggered events corresponded  to sinus rhythm, PACs, PVCs, and PSVT.  Predominantly sinus rhythm with multiple episodes of SVT.  Longest episode lasted 1 minute, 46 seconds send: Due to irregularity in the available tracing, paroxysmal atrial fibrillation cannot be excluded.  ASSESSMENT AND PLAN:  #) SVT #) tachycardia #) palpitations On review of long-term monitor, do not appreciate afib. Discussed with Dr. Lalla Brothers, who concurs TSH, electrolytes, CBC WNL on 10/2022 labs Patient continues to have tachycardic and palpitations requiring multiple PRN lopressor per week Will start toprol 25mg  nightly + continue PRN lopressor     Current medicines are reviewed at length with the patient today.   The patient has concerns regarding her medicines.  The following changes were made today:   START toprol 25mg  nightly CONTINUE lopressor 25mg  PRN for heart rate above 120  Labs/ tests ordered today include:  Orders Placed This Encounter  Procedures   EKG 12-Lead      Disposition: Follow up with EP APP in 4 weeks   Signed, Sherie Don, NP  02/23/23  1:52 PM  Electrophysiology [facility]

## 2023-02-23 ENCOUNTER — Encounter: Payer: Self-pay | Admitting: Cardiology

## 2023-02-23 ENCOUNTER — Ambulatory Visit: Payer: Medicare Other | Attending: Cardiology | Admitting: Cardiology

## 2023-02-23 VITALS — BP 124/74 | HR 110 | Ht 66.0 in | Wt 190.4 lb

## 2023-02-23 DIAGNOSIS — I471 Supraventricular tachycardia, unspecified: Secondary | ICD-10-CM | POA: Diagnosis not present

## 2023-02-23 DIAGNOSIS — I480 Paroxysmal atrial fibrillation: Secondary | ICD-10-CM

## 2023-02-23 DIAGNOSIS — R000 Tachycardia, unspecified: Secondary | ICD-10-CM | POA: Insufficient documentation

## 2023-02-23 DIAGNOSIS — R002 Palpitations: Secondary | ICD-10-CM | POA: Diagnosis not present

## 2023-02-23 MED ORDER — METOPROLOL TARTRATE 25 MG PO TABS: ORAL_TABLET | ORAL | Status: DC

## 2023-02-23 MED ORDER — METOPROLOL SUCCINATE ER 25 MG PO TB24: 25.0000 mg | ORAL_TABLET | Freq: Every day | ORAL | 1 refills | Status: DC

## 2023-02-23 NOTE — Patient Instructions (Addendum)
Medication Instructions:   START Metoprolol Succinate 25 mg 1 tablet by mouth every evening.  -Please continue Metoprolol Tartrate 1 tablet by mouth twice daily as needed for Heart rate greater than 120.   *If you need a refill on your cardiac medications before your next appointment, please call your pharmacy*   Lab Work:  No labs ordered today.  If you have labs (blood work) drawn today and your tests are completely normal, you will receive your results only by: MyChart Message (if you have MyChart) OR A paper copy in the mail If you have any lab test that is abnormal or we need to change your treatment, we will call you to review the results.   Testing/Procedures:  No testing ordered today.   Follow-Up: At [facility], you and your health needs are our priority.  As part of our continuing mission to provide you with exceptional heart care, we have created designated Provider Care Teams.  These Care Teams include your primary Cardiologist (physician) and Advanced Practice Providers (APPs -  Physician Assistants and Nurse Practitioners) who all work together to provide you with the care you need, when you need it.  We recommend signing up for the patient portal called "MyChart".  Sign up information is provided on this After Visit Summary.  MyChart is used to connect with patients for Virtual Visits (Telemedicine).  Patients are able to view lab/test results, encounter notes, upcoming appointments, etc.  Non-urgent messages can be sent to your provider as well.   To learn more about what you can do with MyChart, go to [email or web address].    Your next appointment:   4-6 week(s)  Provider:   Sherie Don, NP

## 2023-03-02 ENCOUNTER — Ambulatory Visit (INDEPENDENT_AMBULATORY_CARE_PROVIDER_SITE_OTHER): Payer: Medicare Other

## 2023-03-02 VITALS — Ht 66.0 in | Wt 198.0 lb

## 2023-03-02 DIAGNOSIS — Z0000 Encounter for general adult medical examination without abnormal findings: Secondary | ICD-10-CM

## 2023-03-02 NOTE — Progress Notes (Signed)
Subjective:   Ashley Rasmussen is a 72 y.o. female who presents for Medicare Annual (Subsequent) preventive examination.  Visit Complete: Virtual  I connected with  Ashley Rasmussen on 03/02/23 by a audio enabled telemedicine application and verified that I am speaking with the correct person using two identifiers.  Patient Location: Home  Provider Location: Office/Clinic  I discussed the limitations of evaluation and management by telemedicine. The patient expressed understanding and agreed to proceed.  Vital Signs: Unable to obtain new vitals due to this being a telehealth visit.   Review of Systems      Cardiac Risk Factors include: advanced age (>35men, >52 women);dyslipidemia;obesity (BMI >30kg/m2)     Objective:    Today's Vitals   03/02/23 0907  Weight: 198 lb (89.8 kg)  Height: 5\' 6"  (1.676 m)   Body mass index is 31.96 kg/m.     03/02/2023    9:13 AM 12/03/2021   10:07 AM 10/30/2020    8:32 AM 10/16/2020   10:06 AM 04/11/2018    9:38 AM 12/09/2017   12:37 PM 09/29/2016    7:00 PM  Advanced Directives  Does Patient Have a Medical Advance Directive? No No No No No No No  Would patient like information on creating a medical advance directive? No - Patient declined  No - Patient declined  No - Patient declined Yes (MAU/Ambulatory/Procedural Areas - Information given) Yes (Inpatient - patient requests chaplain consult to create a medical advance directive)    Current Medications (verified) Outpatient Encounter Medications as of 03/02/2023  Medication Sig   acetaminophen (TYLENOL) 500 MG tablet Take 500 mg by mouth every 6 (six) hours as needed.   clobetasol ointment (TEMOVATE) 0.05 % Apply to affected area every night for 4 weeks, then every other day for 4 weeks and then twice a week for 4 weeks or until resolution.   metoprolol succinate (TOPROL XL) 25 MG 24 hr tablet Take 1 tablet (25 mg total) by mouth daily.   Multiple Vitamin (MULTIVITAMIN) tablet Take 3 tablets by mouth  daily.   omeprazole (PRILOSEC) 20 MG capsule TAKE 1 CAPSULE DAILY AS NEEDED   metoprolol tartrate (LOPRESSOR) 25 MG tablet Take 1 tablet by mouth twice daily as needed for Heart Rate greater than 120. (Patient not taking: Reported on 03/02/2023)   No facility-administered encounter medications on file as of 03/02/2023.    Allergies (verified) Crestor [rosuvastatin], Alendronate sodium, Nsaids, Pravachol, Raloxifene, and Simvastatin   History: Past Medical History:  Diagnosis Date   Allergic rhinitis, cause unspecified    Breast cancer (HCC) 1992   Complication of anesthesia    Family history of breast cancer    Family history of lung cancer    HLD (hyperlipidemia)    Osteopenia    Osteoporosis, unspecified    Personal history of chemotherapy    Personal history of malignant neoplasm of breast    needs yearly mammogram and MRI every 2   Personal history of radiation therapy    PONV (postoperative nausea and vomiting)    Thoracic or lumbosacral neuritis or radiculitis, unspecified    Past Surgical History:  Procedure Laterality Date   BREAST BIOPSY Left 03/2015   BREAST BIOPSY Right 10/09/2020   Breast cancer excision     BREAST LUMPECTOMY Right    COLONOSCOPY WITH PROPOFOL N/A 04/11/2018   Procedure: COLONOSCOPY WITH PROPOFOL;  Surgeon: Wyline Mood, MD;  Location[facility] ENDOSCOPY;  Service: Gastroenterology;  Laterality: N/A;   LATISSIMUS FLAP TO BREAST  due to radiation burn   MASTECTOMY Right 10/30/2020   TOTAL MASTECTOMY Right 10/30/2020   Procedure: RIGHT MASTECTOMY;  Surgeon: Griselda Miner, MD;  Location: [facility];  Service: General;  Laterality: Right;   Family History  Problem Relation Age of Onset   Other Mother        B-12 deficiency   Breast cancer Mother 56   Melanoma Mother        dx 103s (foot) and 41s (arm)   Myelodysplastic syndrome Mother        dx mid-70s   Osteoporosis Mother    Alcoholism Father    Other Sister        B-12  deficiency   Breast cancer Sister 11       "inconclusive" genetic testing   Osteoporosis Sister    Lung cancer Maternal Uncle        radiation exposure    Cancer Paternal Aunt        unknown type, dx >50   Cancer Paternal Aunt        unknown type, dx >50   Breast cancer Cousin 57       (maternal first cousin)   Breast cancer Cousin        dx 17s or 41s (maternal first cousin)   Cancer Nephew 23       Ewing's sarcoma   Heart disease Neg Hx    Social History   Socioeconomic History   Marital status: Married    Spouse name: Not on file   Number of children: 4   Years of education: Not on file   Highest education level: Not on file  Occupational History   Not on file  Tobacco Use   Smoking status: Former    Current packs/day: 0.00    Average packs/day: 0.1 packs/day for 2.0 years (0.2 ttl pk-yrs)    Types: Cigarettes    Start date: 07/28/1987    Quit date: 07/27/1989    Years since quitting: 33.6   Smokeless tobacco: Never   Tobacco comments:    quit over 53yrs ago  Vaping Use   Vaping status: Never Used  Substance and Sexual Activity   Alcohol use: No    Alcohol/week: 0.0 standard drinks of alcohol   Drug use: No   Sexual activity: Never  Other Topics Concern   Not on file  Social History Narrative   Married      4 children         Social Determinants of Health   Financial Resource Strain: Low Risk  (03/02/2023)   Overall Financial Resource Strain (CARDIA)    Difficulty of Paying Living Expenses: Not hard at all  Food Insecurity: No Food Insecurity (03/02/2023)   Hunger Vital Sign    Worried About Running Out of Food in the Last Year: Never true    Ran Out of Food in the Last Year: Never true  Transportation Needs: No Transportation Needs (03/02/2023)   PRAPARE - Administrator, Civil Service (Medical): No    Lack of Transportation (Non-Medical): No  Physical Activity: Sufficiently Active (03/02/2023)   Exercise Vital Sign    Days of Exercise per  Week: 4 days    Minutes of Exercise per Session: 40 min  Stress: No Stress Concern Present (03/02/2023)   [employer] of Occupational Health - Occupational Stress Questionnaire    Feeling of Stress : Not at all  Social Connections: Moderately Isolated (03/02/2023)   Social  Connection and Isolation Panel [NHANES]    Frequency of Communication with Friends and Family: More than three times a week    Frequency of Social Gatherings with Friends and Family: More than three times a week    Attends Religious Services: Never    Database administrator or Organizations: No    Attends Engineer, structural: Never    Marital Status: Married    Tobacco Counseling Counseling given: Not Answered Tobacco comments: quit over 35yrs ago   Clinical Intake:  Pre-visit preparation completed: Yes  Pain : No/denies pain     BMI - recorded: 31.96 Nutritional Status: BMI > 30  Obese Nutritional Risks: None Diabetes: No  How often do you need to have someone help you when you read instructions, pamphlets, or other written materials from your doctor or pharmacy?: 1 - Never  Interpreter Needed?: No  Information entered by :: C.Miraya Cudney LPN   Activities of Daily Living    03/02/2023    9:15 AM  In your present state of health, do you have any difficulty performing the following activities:  Hearing? 0  Vision? 0  Difficulty concentrating or making decisions? 0  Walking or climbing stairs? 0  Dressing or bathing? 0  Doing errands, shopping? 0  Preparing Food and eating ? N  Using the Toilet? N  In the past six months, have you accidently leaked urine? N  Do you have problems with loss of bowel control? N  Managing your Medications? N  Managing your Finances? N  Housekeeping or managing your Housekeeping? N    Patient Care Team: Tower, Audrie Gallus, MD as PCP - General End, Cristal Deer, MD as PCP - Cardiology (Cardiology) Lanier Prude, MD as PCP - Electrophysiology  (Cardiology) Pershing Proud, RN as Oncology Nurse Navigator Donnelly Angelica, RN as Oncology Nurse Navigator Griselda Miner, MD as Consulting Physician (General Surgery) Malachy Mood, MD as Consulting Physician (Hematology) Dorothy Puffer, MD as Consulting Physician (Radiation Oncology)  Indicate any recent Medical Services you may have received from other than [facility] providers in the past year (date may be approximate).     Assessment:   This is a routine wellness examination for Ashley Rasmussen.  Hearing/Vision screen Hearing Screening - Comments:: Denies hearing difficulties   Vision Screening - Comments:: Readers - [facility] - UTD on eye exams  Dietary issues and exercise activities discussed:     Goals Addressed             This Visit's Progress    Patient Stated       Lose weight       Depression Screen    03/02/2023    9:13 AM 12/03/2021   10:15 AM 09/27/2020   11:34 AM 06/27/2019   11:44 AM 12/09/2017   12:38 PM 11/24/2016   11:35 AM 09/26/2015    1:37 PM  PHQ 2/9 Scores  PHQ - 2 Score 0 0 0 0 0 0 0  PHQ- 9 Score     0 0     Fall Risk    03/02/2023    9:15 AM 12/03/2021   10:07 AM 09/27/2020   11:35 AM 06/21/2019   10:02 AM 02/24/2019    6:14 PM  Fall Risk   Falls in the past year? 0 0 0 0 --  Comment    Emmi Telephone Survey: data to providers prior to load [employer] Survey: data to providers prior to load  Number falls in past yr:  0 0 0  --  Comment     Emmi Telephone Survey Actual Response =   Injury with Fall? 0 0     Risk for fall due to : No Fall Risks      Follow up Falls prevention discussed;Falls evaluation completed Falls evaluation completed;Education provided;Falls prevention discussed       MEDICARE RISK AT HOME:  Medicare Risk at Home - 03/02/23 0915     Any stairs in or around the home? No    If so, are there any without handrails? No    Home free of loose throw rugs in walkways, pet beds, electrical cords, etc? Yes    Adequate lighting in your  home to reduce risk of falls? Yes    Life alert? No    Use of a cane, walker or w/c? No    Grab bars in the bathroom? Yes    Shower chair or bench in shower? Yes    Elevated toilet seat or a handicapped toilet? Yes             TIMED UP AND GO:  Was the test performed?  No    Cognitive Function:    12/09/2017   12:38 PM 09/26/2015    1:39 PM  MMSE - Mini Mental State Exam  Orientation to time 5 5  Orientation to Place 5 5  Registration 3 3  Attention/ Calculation 0 5  Recall 2 3  Language- name 2 objects 0 0  Language- repeat 1 1  Language- follow 3 step command 3 3  Language- read & follow direction 0 1  Write a sentence 0 0  Copy design 0 0  Total score 19 26        03/02/2023    9:16 AM 12/03/2021   10:04 AM  6CIT Screen  What Year? 0 points 0 points  What month? 0 points 0 points  What time? 0 points 0 points  Count back from 20 0 points 2 points  Months in reverse 0 points 4 points  Repeat phrase 0 points 4 points  Total Score 0 points 10 points    Immunizations Immunization History  Administered Date(s) Administered   Fluad Quad(high Dose 65+) 05/31/2020, 04/30/2022   Influenza Split 06/21/2012   Influenza Whole 07/13/2002   Influenza,inj,Quad PF,6+ Mos 05/23/2013, 09/26/2015, 05/22/2016, 06/03/2017, 05/12/2018, 03/21/2019   PFIZER(Purple Top)SARS-COV-2 Vaccination 11/06/2019, 12/05/2019, 06/12/2020   Pneumococcal Conjugate-13 09/26/2015   Pneumococcal Polysaccharide-23 11/24/2016   Td 05/27/2005   Tdap 11/24/2016    TDAP status: Up to date  Flu Vaccine status: Due, Education has been provided regarding the importance of this vaccine. Advised may receive this vaccine at local pharmacy or Health Dept. Aware to provide a copy of the vaccination record if obtained from local pharmacy or Health Dept. Verbalized acceptance and understanding.  Pneumococcal vaccine status: Up to date  Covid-19 vaccine status: Information provided on how to obtain  vaccines.   Qualifies for Shingles Vaccine? Yes   Zostavax completed No   Shingrix Completed?: No.    Education has been provided regarding the importance of this vaccine. Patient has been advised to call insurance company to determine out of pocket expense if they have not yet received this vaccine. Advised may also receive vaccine at local pharmacy or Health Dept. Verbalized acceptance and understanding.  Screening Tests Health Maintenance  Topic Date Due   Zoster Vaccines- Shingrix (1 of 2) Never done   COVID-19 Vaccine (4 - 2023-24 season) 03/27/2022  INFLUENZA VACCINE  02/25/2023   MAMMOGRAM  12/09/2023   Medicare Annual Wellness (AWV)  03/01/2024   DTaP/Tdap/Td (3 - Td or Tdap) 11/25/2026   Colonoscopy  04/11/2028   Pneumonia Vaccine 98+ Years old  Completed   DEXA SCAN  Completed   Hepatitis C Screening  Completed   HPV VACCINES  Aged Out    Health Maintenance  Health Maintenance Due  Topic Date Due   Zoster Vaccines- Shingrix (1 of 2) Never done   COVID-19 Vaccine (4 - 2023-24 season) 03/27/2022   INFLUENZA VACCINE  02/25/2023    Colorectal cancer screening: Type of screening: Colonoscopy. Completed 04/11/18. Repeat every 10 years  Mammogram status: Completed 12/09/22. Repeat every year  Bone Density - Declined  Lung Cancer Screening: (Low Dose CT Chest recommended if Age 4-80 years, 20 pack-year currently smoking OR have quit w/in 15years.) does not qualify.   Lung Cancer Screening Referral: no  Additional Screening:  Hepatitis C Screening: does qualify; Completed 09/27/15  Vision Screening: Recommended annual ophthalmology exams for early detection of glaucoma and other disorders of the eye. Is the patient up to date with their annual eye exam?  Yes  Who is the provider or what is the name of the office in which the patient attends annual eye exams? [facility] If pt is not established with a provider, would they like to be referred to a provider to  establish care? Yes .   Dental Screening: Recommended annual dental exams for proper oral hygiene    Community Resource Referral / Chronic Care Management: CRR required this visit?  No   CCM required this visit?  No     Plan:     I have personally reviewed and noted the following in the patient's chart:   Medical and social history Use of alcohol, tobacco or illicit drugs  Current medications and supplements including opioid prescriptions. Patient is not currently taking opioid prescriptions. Functional ability and status Nutritional status Physical activity Advanced directives List of other physicians Hospitalizations, surgeries, and ER visits in previous 12 months Vitals Screenings to include cognitive, depression, and falls Referrals and appointments  In addition, I have reviewed and discussed with patient certain preventive protocols, quality metrics, and best practice recommendations. A written personalized care plan for preventive services as well as general preventive health recommendations were provided to patient.     Maryan Puls, LPN   08/01/1094   After Visit Summary: (MyChart) Due to this being a telephonic visit, the after visit summary with patients personalized plan was offered to patient via MyChart   Nurse Notes: None

## 2023-03-02 NOTE — Patient Instructions (Signed)
Ashley Rasmussen , Thank you for taking time to come for your Medicare Wellness Visit. I appreciate your ongoing commitment to your health goals. Please review the following plan we discussed and let me know if I can assist you in the future.   Referrals/Orders/Follow-Ups/Clinician Recommendations: Aim for 30 minutes of exercise or brisk walking, 6-8 glasses of water, and 5 servings of fruits and vegetables each day.   This is a list of the screening recommended for you and due dates:  Health Maintenance  Topic Date Due   Zoster (Shingles) Vaccine (1 of 2) Never done   COVID-19 Vaccine (4 - 2023-24 season) 03/27/2022   Medicare Annual Wellness Visit  12/04/2022   Flu Shot  02/25/2023   Mammogram  12/09/2023   DTaP/Tdap/Td vaccine (3 - Td or Tdap) 11/25/2026   Colon Cancer Screening  04/11/2028   Pneumonia Vaccine  Completed   DEXA scan (bone density measurement)  Completed   Hepatitis C Screening  Completed   HPV Vaccine  Aged Out    Advanced directives: (Declined) Advance directive discussed with you today. Even though you declined this today, please call our office should you change your mind, and we can give you the proper paperwork for you to fill out.  Next Medicare Annual Wellness Visit scheduled for next year: Yes  Preventive Care 39 Years and Older, Female Preventive care refers to lifestyle choices and visits with your health care provider that can promote health and wellness. What does preventive care include? A yearly physical exam. This is also called an annual well check. Dental exams once or twice a year. Routine eye exams. Ask your health care provider how often you should have your eyes checked. Personal lifestyle choices, including: Daily care of your teeth and gums. Regular physical activity. Eating a healthy diet. Avoiding tobacco and drug use. Limiting alcohol use. Practicing safe sex. Taking low-dose aspirin every day. Taking vitamin and mineral supplements as  recommended by your health care provider. What happens during an annual well check? The services and screenings done by your health care provider during your annual well check will depend on your age, overall health, lifestyle risk factors, and family history of disease. Counseling  Your health care provider may ask you questions about your: Alcohol use. Tobacco use. Drug use. Emotional well-being. Home and relationship well-being. Sexual activity. Eating habits. History of falls. Memory and ability to understand (cognition). Work and work Astronomer. Reproductive health. Screening  You may have the following tests or measurements: Height, weight, and BMI. Blood pressure. Lipid and cholesterol levels. These may be checked every 5 years, or more frequently if you are over 66 years old. Skin check. Lung cancer screening. You may have this screening every year starting at age 61 if you have a 30-pack-year history of smoking and currently smoke or have quit within the past 15 years. Fecal occult blood test (FOBT) of the stool. You may have this test every year starting at age 70. Flexible sigmoidoscopy or colonoscopy. You may have a sigmoidoscopy every 5 years or a colonoscopy every 10 years starting at age 21. Hepatitis C blood test. Hepatitis B blood test. Sexually transmitted disease (STD) testing. Diabetes screening. This is done by checking your blood sugar (glucose) after you have not eaten for a while (fasting). You may have this done every 1-3 years. Bone density scan. This is done to screen for osteoporosis. You may have this done starting at age 65. Mammogram. This may be done every 1-2  years. Talk to your health care provider about how often you should have regular mammograms. Talk with your health care provider about your test results, treatment options, and if necessary, the need for more tests. Vaccines  Your health care provider may recommend certain vaccines, such  as: Influenza vaccine. This is recommended every year. Tetanus, diphtheria, and acellular pertussis (Tdap, Td) vaccine. You may need a Td booster every 10 years. Zoster vaccine. You may need this after age 57. Pneumococcal 13-valent conjugate (PCV13) vaccine. One dose is recommended after age 68. Pneumococcal polysaccharide (PPSV23) vaccine. One dose is recommended after age 27. Talk to your health care provider about which screenings and vaccines you need and how often you need them. This information is not intended to replace advice given to you by your health care provider. Make sure you discuss any questions you have with your health care provider. Document Released: 08/09/2015 Document Revised: 04/01/2016 Document Reviewed: 05/14/2015 Elsevier Interactive Patient Education  2017 [employer].  Fall Prevention in the Home Falls can cause injuries. They can happen to people of all ages. There are many things you can do to make your home safe and to help prevent falls. What can I do on the outside of my home? Regularly fix the edges of walkways and driveways and fix any cracks. Remove anything that might make you trip as you walk through a door, such as a raised step or threshold. Trim any bushes or trees on the path to your home. Use bright outdoor lighting. Clear any walking paths of anything that might make someone trip, such as rocks or tools. Regularly check to see if handrails are loose or broken. Make sure that both sides of any steps have handrails. Any raised decks and porches should have guardrails on the edges. Have any leaves, snow, or ice cleared regularly. Use sand or salt on walking paths during winter. Clean up any spills in your garage right away. This includes oil or grease spills. What can I do in the bathroom? Use night lights. Install grab bars by the toilet and in the tub and shower. Do not use towel bars as grab bars. Use non-skid mats or decals in the tub or  shower. If you need to sit down in the shower, use a plastic, non-slip stool. Keep the floor dry. Clean up any water that spills on the floor as soon as it happens. Remove soap buildup in the tub or shower regularly. Attach bath mats securely with double-sided non-slip rug tape. Do not have throw rugs and other things on the floor that can make you trip. What can I do in the bedroom? Use night lights. Make sure that you have a light by your bed that is easy to reach. Do not use any sheets or blankets that are too big for your bed. They should not hang down onto the floor. Have a firm chair that has side arms. You can use this for support while you get dressed. Do not have throw rugs and other things on the floor that can make you trip. What can I do in the kitchen? Clean up any spills right away. Avoid walking on wet floors. Keep items that you use a lot in easy-to-reach places. If you need to reach something above you, use a strong step stool that has a grab bar. Keep electrical cords out of the way. Do not use floor polish or wax that makes floors slippery. If you must use wax, use non-skid floor  wax. Do not have throw rugs and other things on the floor that can make you trip. What can I do with my stairs? Do not leave any items on the stairs. Make sure that there are handrails on both sides of the stairs and use them. Fix handrails that are broken or loose. Make sure that handrails are as long as the stairways. Check any carpeting to make sure that it is firmly attached to the stairs. Fix any carpet that is loose or worn. Avoid having throw rugs at the top or bottom of the stairs. If you do have throw rugs, attach them to the floor with carpet tape. Make sure that you have a light switch at the top of the stairs and the bottom of the stairs. If you do not have them, ask someone to add them for you. What else can I do to help prevent falls? Wear shoes that: Do not have high heels. Have  rubber bottoms. Are comfortable and fit you well. Are closed at the toe. Do not wear sandals. If you use a stepladder: Make sure that it is fully opened. Do not climb a closed stepladder. Make sure that both sides of the stepladder are locked into place. Ask someone to hold it for you, if possible. Clearly mark and make sure that you can see: Any grab bars or handrails. First and last steps. Where the edge of each step is. Use tools that help you move around (mobility aids) if they are needed. These include: Canes. Walkers. Scooters. Crutches. Turn on the lights when you go into a dark area. Replace any light bulbs as soon as they burn out. Set up your furniture so you have a clear path. Avoid moving your furniture around. If any of your floors are uneven, fix them. If there are any pets around you, be aware of where they are. Review your medicines with your doctor. Some medicines can make you feel dizzy. This can increase your chance of falling. Ask your doctor what other things that you can do to help prevent falls. This information is not intended to replace advice given to you by your health care provider. Make sure you discuss any questions you have with your health care provider. Document Released: 05/09/2009 Document Revised: 12/19/2015 Document Reviewed: 08/17/2014 Elsevier Interactive Patient Education  2017 [employer].

## 2023-03-11 NOTE — Progress Notes (Signed)
Cardiology Office Note    Date:  03/15/2023   ID:  [name], DOB 15-May-1951, MRN [id number]  PCP:  Judy Pimple, MD  Cardiologist:  Yvonne Kendall, MD  Electrophysiologist:  Lanier Prude, MD   Chief Complaint: Follow-up  History of Present Illness:   Ashley Rasmussen is a 72 y.o. female with history of symptomatic PSVT, right sided breast cancer status post lumpectomy in 1992 status post mastectomy in 2022, and left-sided cholesteatoma who presents for follow-up of SVT.  Lexiscan MPI in 2018 showed no evidence of infarct or ischemia with an EF greater than 65% and was overall low risk.  She was evaluated as a new patient on 01/27/2023 for tachypalpitations at the request of her PCP.  She reported a long history of mildly elevated heart rates, however over the preceding several weeks she developed intermittent episodes of a heart pounding sensation with associated lightheadedness that was different from her typical vertigo.  No frank syncope.  She was without symptoms of angina or cardiac decompensation.  She had used recently started as needed metoprolol several times for palpitations.  Zio in 01/2023 showed a predominant rhythm of sinus with an average rate of 81 bpm (31 to 144 bpm in sinus), 62 episodes of SVT lasting up to 1 minute and 46 seconds with a maximum rate of 231 bpm.  The longest episode was felt to possibly represent A-fib as the tachyarrhythmia.  Irregular.  Rare PACs and PVCs were noted.  No prolonged pauses.  Patient triggered events corresponded to sinus rhythm, PACs, PVCs, and PSVT.  In the setting of possible A-fib, primary cardiologist recommended the patient be evaluated by EP for further evaluation of her arrhythmia.  Echo on 02/18/2023 showed an EF of 60 to 65%, no regional wall motion abnormalities, grade 1 diastolic dysfunction, normal RV systolic function and ventricular cavity size, no significant valvular abnormalities, and an estimated right atrial pressure  of 3 mmHg.  She was evaluated by EP on 02/23/2023 and continued to note palpitations with associated lightheadedness and dizziness without frank syncope.  She was taking as needed Lopressor 1-2 times per week.  Arrhythmia was not felt to represent A-fib with recommendation for patient to start Toprol-XL 25 mg nightly with continuation of as needed Lopressor.  She comes in doing very well from a cardiac perspective and is without symptoms of angina or cardiac decompensation.  Palpitation burden has significantly improved following the addition of Toprol-XL.  She has only needed as needed Lopressor once.  No dizziness, near-syncope, or syncope.  No chest pain or dyspnea.  No falls.  No significant lower extremity swelling.  She does not drink caffeine.  Overall, she feels very well and does not have any acute cardiac concerns at this time.   Labs independently reviewed: 10/2022 - TSH normal, BUN 18, serum creatinine 0.89, potassium 3.8, BUN 4.2, AST/ALT normal, Hgb 14.9, PLT 271 03/2022 - TC 248, TG 164, HDL 44, LDL 171, A1c 5.9  Past Medical History:  Diagnosis Date   Allergic rhinitis, cause unspecified    Breast cancer (HCC) 1992   Complication of anesthesia    Family history of breast cancer    HLD (hyperlipidemia)    Osteopenia    Osteoporosis, unspecified    Personal history of chemotherapy    Personal history of malignant neoplasm of breast    needs yearly mammogram and MRI every 2   Personal history of radiation therapy    PONV (postoperative nausea  and vomiting)    Thoracic or lumbosacral neuritis or radiculitis, unspecified     Past Surgical History:  Procedure Laterality Date   BREAST BIOPSY Left 03/2015   BREAST BIOPSY Right 10/09/2020   Breast cancer excision     BREAST LUMPECTOMY Right    COLONOSCOPY WITH PROPOFOL N/A 04/11/2018   Procedure: COLONOSCOPY WITH PROPOFOL;  Surgeon: Wyline Mood, MD;  Location: [facility] ENDOSCOPY;  Service: Gastroenterology;  Laterality: N/A;    LATISSIMUS FLAP TO BREAST     due to radiation burn   MASTECTOMY Right 10/30/2020   TOTAL MASTECTOMY Right 10/30/2020   Procedure: RIGHT MASTECTOMY;  Surgeon: Griselda Miner, MD;  Location: [facility];  Service: General;  Laterality: Right;    Current Medications: Current Meds  Medication Sig   acetaminophen (TYLENOL) 500 MG tablet Take 500 mg by mouth every 6 (six) hours as needed.   clobetasol ointment (TEMOVATE) 0.05 % Apply to affected area every night for 4 weeks, then every other day for 4 weeks and then twice a week for 4 weeks or until resolution.   metoprolol succinate (TOPROL XL) 25 MG 24 hr tablet Take 1 tablet (25 mg total) by mouth daily.   metoprolol tartrate (LOPRESSOR) 25 MG tablet Take 1 tablet by mouth twice daily as needed for Heart Rate greater than 120.   Multiple Vitamin (MULTIVITAMIN) tablet Take 3 tablets by mouth daily.   omeprazole (PRILOSEC) 20 MG capsule TAKE 1 CAPSULE DAILY AS NEEDED    Allergies:   Crestor [rosuvastatin], Alendronate sodium, Nsaids, Pravachol, Raloxifene, and Simvastatin   Social History   Socioeconomic History   Marital status: Married    Spouse name: Not on file   Number of children: 4   Years of education: Not on file   Highest education level: Not on file  Occupational History   Not on file  Tobacco Use   Smoking status: Former    Current packs/day: 0.00    Average packs/day: 0.1 packs/day for 2.0 years (0.2 ttl pk-yrs)    Types: Cigarettes    Start date: 07/28/1987    Quit date: 07/27/1989    Years since quitting: 33.6   Smokeless tobacco: Never   Tobacco comments:    quit over 6yrs ago  Vaping Use   Vaping status: Never Used  Substance and Sexual Activity   Alcohol use: No    Alcohol/week: 0.0 standard drinks of alcohol   Drug use: No   Sexual activity: Never  Other Topics Concern   Not on file  Social History Narrative   Married      4 children         Social Determinants of Health   Financial  Resource Strain: Low Risk  (03/02/2023)   Overall Financial Resource Strain (CARDIA)    Difficulty of Paying Living Expenses: Not hard at all  Food Insecurity: No Food Insecurity (03/02/2023)   Hunger Vital Sign    Worried About Running Out of Food in the Last Year: Never true    Ran Out of Food in the Last Year: Never true  Transportation Needs: No Transportation Needs (03/02/2023)   PRAPARE - Administrator, Civil Service (Medical): No    Lack of Transportation (Non-Medical): No  Physical Activity: Sufficiently Active (03/02/2023)   Exercise Vital Sign    Days of Exercise per Week: 4 days    Minutes of Exercise per Session: 40 min  Stress: No Stress Concern Present (03/02/2023)   [employer]  of Occupational Health - Occupational Stress Questionnaire    Feeling of Stress : Not at all  Social Connections: Moderately Isolated (03/02/2023)   Social Connection and Isolation Panel [NHANES]    Frequency of Communication with Friends and Family: More than three times a week    Frequency of Social Gatherings with Friends and Family: More than three times a week    Attends Religious Services: Never    Database administrator or Organizations: No    Attends Engineer, structural: Never    Marital Status: Married     Family History:  The patient's family history includes Alcoholism in her father; Breast cancer in her cousin; Breast cancer (age of onset: 21) in her sister; Breast cancer (age of onset: 12) in her cousin; Breast cancer (age of onset: 74) in her mother; Cancer in her paternal aunt and paternal aunt; Cancer (age of onset: 28) in her nephew; Lung cancer in her maternal uncle; Melanoma in her mother; Myelodysplastic syndrome in her mother; Osteoporosis in her mother and sister; Other in her mother and sister. There is no history of Heart disease.  ROS:   12-point review of systems is negative unless otherwise noted in the HPI.   EKGs/Labs/Other Studies Reviewed:     Studies reviewed were summarized above. The additional studies were reviewed today:  2D echo 02/18/2023: 1. Left ventricular ejection fraction, by estimation, is 60 to 65%. The  left ventricle has normal function. The left ventricle has no regional  wall motion abnormalities. Left ventricular diastolic parameters are  consistent with Grade I diastolic  dysfunction (impaired relaxation). The average left ventricular global  longitudinal strain is -24.8 %.   2. Right ventricular systolic function is normal. The right ventricular  size is normal.   3. The mitral valve is normal in structure. No evidence of mitral valve  regurgitation. No evidence of mitral stenosis.   4. The aortic valve is normal in structure. Aortic valve regurgitation is  not visualized. No aortic stenosis is present.   5. The inferior vena cava is normal in size with greater than 50%  respiratory variability, suggesting right atrial pressure of 3 mmHg.  __________  Luci Bank patch 01/2023:   The patient was monitored for 7 days, 16 hours.   The predominant rhythm was sinus with an average rate of 81 bpm (range 31-144 bpm in sinus).   There were rare PACs and PVCs.   62 supraventricular runs were observed, lasting up to 1 minute, 46 seconds with a maximum rate of 231 bpm.  The longest episode could represent atrial fibrillation, as the tachyarrhythmia appears irregular.   No prolonged pause was observed.   Patient triggered events corresponded to sinus rhythm, PACs, PVCs, and PSVT.   Predominantly sinus rhythm with multiple episodes of SVT.  Longest episode lasted 1 minute, 46 seconds send: Due to irregularity in the available tracing, paroxysmal atrial fibrillation cannot be excluded. __________  Eugenie Birks MPI 09/30/2016: There was no ST segment deviation noted during stress. No T wave inversion was noted during stress. The study is normal. This is a low risk study. The left ventricular ejection fraction is hyperdynamic  (>65%).   Normal pharmacologic nuclear stress test with no evidence of prior infarct or ischemia.   EKG:  EKG is ordered today.  The EKG ordered today demonstrates NSR, 76 bpm, no acute ST/T changes  Recent Labs: 11/20/2022: ALT 11; BUN 18; Creat 0.89; Hemoglobin 14.9; Platelets 271; Potassium 3.8; Sodium 141; TSH 1.07  Recent Lipid Panel    Component Value Date/Time   CHOL 248 (H) 04/24/2022 1004   TRIG 164.0 (H) 04/24/2022 1004   HDL 44.00 04/24/2022 1004   CHOLHDL 6 04/24/2022 1004   VLDL 32.8 04/24/2022 1004   LDLCALC 171 (H) 04/24/2022 1004   LDLDIRECT 153.0 11/24/2016 1124    PHYSICAL EXAM:    VS:  BP 136/86 (BP Location: Left Arm, Patient Position: Sitting, Cuff Size: Normal)   Pulse 76   Ht 5\' 6"  (1.676 m)   Wt 191 lb 9.6 oz (86.9 kg)   SpO2 98%   BMI 30.93 kg/m   BMI: Body mass index is 30.93 kg/m.  Physical Exam Constitutional:      Appearance: She is well-developed.  HENT:     Head: Normocephalic and atraumatic.  Eyes:     General:        Right eye: No discharge.        Left eye: No discharge.  Neck:     Vascular: No JVD.  Cardiovascular:     Rate and Rhythm: Normal rate and regular rhythm.     Heart sounds: Normal heart sounds, S1 normal and S2 normal. Heart sounds not distant. No midsystolic click and no opening snap. No murmur heard.    No friction rub.  Pulmonary:     Effort: Pulmonary effort is normal. No respiratory distress.     Breath sounds: Normal breath sounds. No decreased breath sounds, wheezing or rales.  Chest:     Chest wall: No tenderness.  Abdominal:     General: There is no distension.  Musculoskeletal:     Cervical back: Normal range of motion.     Right lower leg: No edema.     Left lower leg: No edema.  Skin:    General: Skin is warm and dry.     Nails: There is no clubbing.  Neurological:     Mental Status: She is alert and oriented to person, place, and time.  Psychiatric:        Speech: Speech normal.        Behavior:  Behavior normal.        Thought Content: Thought content normal.        Judgment: Judgment normal.     Wt Readings from Last 3 Encounters:  03/15/23 191 lb 9.6 oz (86.9 kg)  03/02/23 198 lb (89.8 kg)  02/23/23 190 lb 6 oz (86.4 kg)     ASSESSMENT & PLAN:   PSVT/palpitations: Palpitation burden significantly improved following addition of Toprol-XL 25 mg nightly, which will be continued.  Continue as needed Lopressor 25 mg twice daily for sustained tachypalpitations.  Continue to minimize caffeine intake.     Disposition: F/u with Dr. Okey Dupre or an APP in 6 months, and EP as directed.    Medication Adjustments/Labs and Tests Ordered: Current medicines are reviewed at length with the patient today.  Concerns regarding medicines are outlined above. Medication changes, Labs and Tests ordered today are summarized above and listed in the Patient Instructions accessible in Encounters.   Signed, Eula Listen, PA-C 03/15/2023 12:43 PM     [facility] [place] [address] [place], [place] [address] [phone]

## 2023-03-15 ENCOUNTER — Encounter: Payer: Self-pay | Admitting: Physician Assistant

## 2023-03-15 ENCOUNTER — Ambulatory Visit: Payer: Medicare Other | Attending: Physician Assistant | Admitting: Physician Assistant

## 2023-03-15 VITALS — BP 136/86 | HR 76 | Ht 66.0 in | Wt 191.6 lb

## 2023-03-15 DIAGNOSIS — I471 Supraventricular tachycardia, unspecified: Secondary | ICD-10-CM | POA: Insufficient documentation

## 2023-03-15 DIAGNOSIS — R002 Palpitations: Secondary | ICD-10-CM | POA: Diagnosis not present

## 2023-03-15 NOTE — Patient Instructions (Signed)
Medication Instructions:  Your Physician recommend you continue on your current medication as directed.     *If you need a refill on your cardiac medications before your next appointment, please call your pharmacy*   Lab Work: None  If you have labs (blood work) drawn today and your tests are completely normal, you will receive your results only by: MyChart Message (if you have MyChart) OR A paper copy in the mail If you have any lab test that is abnormal or we need to change your treatment, we will call you to review the results.   Testing/Procedures: None   Follow-Up: At [facility], you and your health needs are our priority.  As part of our continuing mission to provide you with exceptional heart care, we have created designated Provider Care Teams.  These Care Teams include your primary Cardiologist (physician) and Advanced Practice Providers (APPs -  Physician Assistants and Nurse Practitioners) who all work together to provide you with the care you need, when you need it.  We recommend signing up for the patient portal called "MyChart".  Sign up information is provided on this After Visit Summary.  MyChart is used to connect with patients for Virtual Visits (Telemedicine).  Patients are able to view lab/test results, encounter notes, upcoming appointments, etc.  Non-urgent messages can be sent to your provider as well.   To learn more about what you can do with MyChart, go to [email or web address].    Your next appointment:   6 month(s)  Provider:   You may see Yvonne Kendall, MD or one of the following Advanced Practice Providers on your designated Care Team:    Eula Listen, [place]

## 2023-04-01 NOTE — Progress Notes (Signed)
Cardiology Office Note Date:  04/02/2023  Patient ID:  Ashley Rasmussen, Ashley Rasmussen 10-Mar-1951, MRN [id number] PCP:  Judy Pimple, MD  Cardiologist:  Yvonne Kendall, MD Electrophysiologist: Lanier Prude, MD (though has not met)    Chief Complaint: SVT  History of Present Illness: Ashley Rasmussen is a 72 y.o. female with PMH notable for Breast Ca, HLD, tachycardia; seen today for Lanier Prude, MD for routine follow-up of SVT.  She was seen by Dr. Okey Dupre 01/2023 for her palpitation, tachycardia. She complained of episodes where her heart was pounding harder and faster in chest, neck, and sometimes in ears. These were sometimes associated with lightheaded/faint feeling. He ordered an ambulatory monitor that showed episodes of SVT and some concern for AFib. Lopressor PRN continued.  I saw her 7/30 to establish care with EP where she continued to have symptomatic palpitation episodes and was taking PRN lopressor 1-2/week. Reviewed ambulatory monitor with Dr. Lalla Brothers, did not appreciate afib. Given ongoing symptoms, I started her on 25mg  toprol nightly.   On follow-up today, her palpitations are significantly improved. She did notice increased headaches when she started the toprol, but her headaches have since improved with ongoing use. She has not needed the PRN lopressor at all.   She denies chest pain, dyspnea, PND, orthopnea, nausea, vomiting, dizziness, syncope, edema, weight gain, or early satiety.      Past Medical History:  Diagnosis Date   Allergic rhinitis, cause unspecified    Breast cancer (HCC) 1992   Complication of anesthesia    Family history of breast cancer    HLD (hyperlipidemia)    Osteopenia    Osteoporosis, unspecified    Personal history of chemotherapy    Personal history of malignant neoplasm of breast    needs yearly mammogram and MRI every 2   Personal history of radiation therapy    PONV (postoperative nausea and vomiting)    Thoracic or lumbosacral neuritis or  radiculitis, unspecified     Past Surgical History:  Procedure Laterality Date   BREAST BIOPSY Left 03/2015   BREAST BIOPSY Right 10/09/2020   Breast cancer excision     BREAST LUMPECTOMY Right    COLONOSCOPY WITH PROPOFOL N/A 04/11/2018   Procedure: COLONOSCOPY WITH PROPOFOL;  Surgeon: Wyline Mood, MD;  Location: [facility] ENDOSCOPY;  Service: Gastroenterology;  Laterality: N/A;   LATISSIMUS FLAP TO BREAST     due to radiation burn   MASTECTOMY Right 10/30/2020   TOTAL MASTECTOMY Right 10/30/2020   Procedure: RIGHT MASTECTOMY;  Surgeon: Griselda Miner, MD;  Location: [facility];  Service: General;  Laterality: Right;    Current Outpatient Medications  Medication Instructions   acetaminophen (TYLENOL) 500 mg, Oral, Every 6 hours PRN   clobetasol ointment (TEMOVATE) 0.05 % Apply to affected area every night for 4 weeks, then every other day for 4 weeks and then twice a week for 4 weeks or until resolution.   metoprolol succinate (TOPROL XL) 25 mg, Oral, Daily   metoprolol tartrate (LOPRESSOR) 25 MG tablet Take 1 tablet by mouth twice daily as needed for Heart Rate greater than 120.   Multiple Vitamin (MULTIVITAMIN) tablet 3 tablets, Oral, Daily   omeprazole (PRILOSEC) 20 MG capsule TAKE 1 CAPSULE DAILY AS NEEDED    Social History:  The patient  reports that she quit smoking about 33 years ago. Her smoking use included cigarettes. She started smoking about 35 years ago. She has a 0.2 pack-year smoking history. She has  never used smokeless tobacco. She reports that she does not drink alcohol and does not use drugs.   Family History:  The patient's family history includes Alcoholism in her father; Breast cancer in her cousin; Breast cancer (age of onset: 21) in her sister; Breast cancer (age of onset: 40) in her cousin; Breast cancer (age of onset: 21) in her mother; Cancer in her paternal aunt and paternal aunt; Cancer (age of onset: 66) in her nephew; Lung cancer in her maternal  uncle; Melanoma in her mother; Myelodysplastic syndrome in her mother; Osteoporosis in her mother and sister; Other in her mother and sister.  ROS:  Please see the history of present illness. All other systems are reviewed and otherwise negative.   PHYSICAL EXAM:  VS:  BP (!) 142/84 (BP Location: Left Arm, Patient Position: Sitting, Cuff Size: Normal)   Pulse 76   Ht 5\' 6"  (1.676 m)   Wt 191 lb (86.6 kg)   SpO2 96%   BMI 30.83 kg/m  BMI: Body mass index is 30.83 kg/m.  GEN- The patient is well appearing, alert and oriented x 3 today.   Lungs- Clear to ausculation bilaterally, normal work of breathing.  Heart- Regular rate and rhythm, no murmurs, rubs or gallops Extremities- Trace peripheral edema, warm, dry   EKG is ordered. Personal review of EKG from today shows:    EKG Interpretation Date/Time:  Friday April 02 2023 10:58:36 EDT Ventricular Rate:  76 PR Interval:  150 QRS Duration:  90 QT Interval:  384 QTC Calculation: 432 R Axis:   50  Text Interpretation: Normal sinus rhythm Increased R/S ratio in V1, consider early transition or posterior infarct Confirmed by Sherie Don [phone]) on 04/02/2023 11:36:09 AM    Recent Labs: 11/20/2022: ALT 11; BUN 18; Creat 0.89; Hemoglobin 14.9; Platelets 271; Potassium 3.8; Sodium 141; TSH 1.07  04/24/2022: Cholesterol 248; HDL 44.00; LDL Cholesterol 171; Total CHOL/HDL Ratio 6; Triglycerides 164.0; VLDL 32.8   CrCl cannot be calculated (Patient's most recent lab result is older than the maximum 21 days allowed.).   Wt Readings from Last 3 Encounters:  04/02/23 191 lb (86.6 kg)  03/15/23 191 lb 9.6 oz (86.9 kg)  03/02/23 198 lb (89.8 kg)     Additional studies reviewed include: Previous EP, cardiology notes.   TTE, 02/18/2023  1. Left ventricular ejection fraction, by estimation, is 60 to 65%. The left ventricle has normal function. The left ventricle has no regional wall motion abnormalities. Left ventricular diastolic  parameters are consistent with Grade I diastolic dysfunction (impaired relaxation). The average left ventricular global longitudinal strain is -24.8 %.   2. Right ventricular systolic function is normal. The right ventricular size is normal.   3. The mitral valve is normal in structure. No evidence of mitral valve regurgitation. No evidence of mitral stenosis.   4. The aortic valve is normal in structure. Aortic valve regurgitation is not visualized. No aortic stenosis is present.   5. The inferior vena cava is normal in size with greater than 50% respiratory variability, suggesting right atrial pressure of 3 mmHg.   Long Term monitor, 02/18/2023   The patient was monitored for 7 days, 16 hours.   The predominant rhythm was sinus with an average rate of 81 bpm (range 31-144 bpm in sinus).   There were rare PACs and PVCs.   62 supraventricular runs were observed, lasting up to 1 minute, 46 seconds with a maximum rate of 231 bpm.  The longest episode could  represent atrial fibrillation, as the tachyarrhythmia appears irregular.   No prolonged pause was observed.   Patient triggered events corresponded to sinus rhythm, PACs, PVCs, and PSVT.  Predominantly sinus rhythm with multiple episodes of SVT.  Longest episode lasted 1 minute, 46 seconds send: Due to irregularity in the available tracing, paroxysmal atrial fibrillation cannot be excluded.  ASSESSMENT AND PLAN:  #) SVT #) palpitations Multiple SVT episodes on zio, no Afib Initially had headaches with initiation of daily BB, none lately Significant symptomatic improvement on 25mg  toprol nightly, will continue    Current medicines are reviewed at length with the patient today.   The patient does not have concerns regarding her medicines.  The following changes were made today:   none  Labs/ tests ordered today include:  Orders Placed This Encounter  Procedures   EKG 12-Lead     Disposition: Follow up with EP APP in 6  months   Signed, Sherie Don, NP  04/02/23  12:16 PM  Electrophysiology [facility]

## 2023-04-02 ENCOUNTER — Ambulatory Visit: Payer: Medicare Other | Attending: Cardiology | Admitting: Cardiology

## 2023-04-02 ENCOUNTER — Encounter: Payer: Self-pay | Admitting: Cardiology

## 2023-04-02 VITALS — BP 142/84 | HR 76 | Ht 66.0 in | Wt 191.0 lb

## 2023-04-02 DIAGNOSIS — R002 Palpitations: Secondary | ICD-10-CM | POA: Insufficient documentation

## 2023-04-02 DIAGNOSIS — I471 Supraventricular tachycardia, unspecified: Secondary | ICD-10-CM | POA: Diagnosis not present

## 2023-04-02 NOTE — Patient Instructions (Signed)
Medication Instructions:  The current medical regimen is effective;  continue present plan and medications.  *If you need a refill on your cardiac medications before your next appointment, please call your pharmacy*   Follow-Up: At [facility], you and your health needs are our priority.  As part of our continuing mission to provide you with exceptional heart care, we have created designated Provider Care Teams.  These Care Teams include your primary Cardiologist (physician) and Advanced Practice Providers (APPs -  Physician Assistants and Nurse Practitioners) who all work together to provide you with the care you need, when you need it.  We recommend signing up for the patient portal called "MyChart".  Sign up information is provided on this After Visit Summary.  MyChart is used to connect with patients for Virtual Visits (Telemedicine).  Patients are able to view lab/test results, encounter notes, upcoming appointments, etc.  Non-urgent messages can be sent to your provider as well.   To learn more about what you can do with MyChart, go to [email or web address].    Your next appointment:   6 month(s)  Provider:   Sherie Don, NP

## 2023-05-05 ENCOUNTER — Ambulatory Visit: Payer: Medicare Other

## 2023-05-18 ENCOUNTER — Other Ambulatory Visit: Payer: Self-pay | Admitting: Family Medicine

## 2023-05-18 NOTE — Telephone Encounter (Signed)
Spoke to pt, scheduled cpe for 06/03/23

## 2023-05-18 NOTE — Telephone Encounter (Signed)
Pt is overdue for her CPE (labs prior if possible), please schedule and then route back to me to refill, thanks

## 2023-05-23 ENCOUNTER — Telehealth: Payer: Self-pay | Admitting: Family Medicine

## 2023-05-23 DIAGNOSIS — M810 Age-related osteoporosis without current pathological fracture: Secondary | ICD-10-CM

## 2023-05-23 DIAGNOSIS — R7309 Other abnormal glucose: Secondary | ICD-10-CM

## 2023-05-23 DIAGNOSIS — E7849 Other hyperlipidemia: Secondary | ICD-10-CM

## 2023-05-23 DIAGNOSIS — R000 Tachycardia, unspecified: Secondary | ICD-10-CM

## 2023-05-23 DIAGNOSIS — Z79899 Other long term (current) drug therapy: Secondary | ICD-10-CM | POA: Insufficient documentation

## 2023-05-23 NOTE — Telephone Encounter (Signed)
-----   Message from Alvina Chou sent at 05/18/2023  3:47 PM EDT ----- Regarding: Lab orders for [facility], 10.31.24 Patient is scheduled for CPX labs, please order future labs, Thanks , Camelia Eng

## 2023-05-27 ENCOUNTER — Other Ambulatory Visit (INDEPENDENT_AMBULATORY_CARE_PROVIDER_SITE_OTHER): Payer: Medicare Other

## 2023-05-27 DIAGNOSIS — M810 Age-related osteoporosis without current pathological fracture: Secondary | ICD-10-CM

## 2023-05-27 DIAGNOSIS — R7309 Other abnormal glucose: Secondary | ICD-10-CM | POA: Diagnosis not present

## 2023-05-27 DIAGNOSIS — R000 Tachycardia, unspecified: Secondary | ICD-10-CM

## 2023-05-27 DIAGNOSIS — E7849 Other hyperlipidemia: Secondary | ICD-10-CM | POA: Diagnosis not present

## 2023-05-27 DIAGNOSIS — Z79899 Other long term (current) drug therapy: Secondary | ICD-10-CM | POA: Diagnosis not present

## 2023-05-27 LAB — COMPREHENSIVE METABOLIC PANEL
ALT: 16 U/L (ref 0–35)
AST: 16 U/L (ref 0–37)
Albumin: 4 g/dL (ref 3.5–5.2)
Alkaline Phosphatase: 94 U/L (ref 39–117)
BUN: 14 mg/dL (ref 6–23)
CO2: 28 meq/L (ref 19–32)
Calcium: 9.4 mg/dL (ref 8.4–10.5)
Chloride: 106 meq/L (ref 96–112)
Creatinine, Ser: 0.81 mg/dL (ref 0.40–1.20)
GFR: 72.23 mL/min (ref >60.00)
Glucose, Bld: 131 mg/dL — ABNORMAL HIGH (ref 70–99)
Potassium: 3.5 meq/L (ref 3.5–5.1)
Sodium: 144 meq/L (ref 135–145)
Total Bilirubin: 0.8 mg/dL (ref 0.2–1.2)
Total Protein: 6.2 g/dL (ref 6.0–8.3)

## 2023-05-27 LAB — CBC WITH DIFFERENTIAL/PLATELET
Basophils Absolute: 0 10*3/uL (ref 0.0–0.1)
Basophils Relative: 0.4 % (ref 0.0–3.0)
Eosinophils Absolute: 0.1 10*3/uL (ref 0.0–0.7)
Eosinophils Relative: 2 % (ref 0.0–5.0)
HCT: 42.8 % (ref 36.0–46.0)
Hemoglobin: 13.8 g/dL (ref 12.0–15.0)
Lymphocytes Relative: 29.8 % (ref 12.0–46.0)
Lymphs Abs: 1.8 10*3/uL (ref 0.7–4.0)
MCHC: 32.2 g/dL (ref 30.0–36.0)
MCV: 90.9 fL (ref 78.0–100.0)
Monocytes Absolute: 0.4 10*3/uL (ref 0.1–1.0)
Monocytes Relative: 6.9 % (ref 3.0–12.0)
Neutro Abs: 3.7 10*3/uL (ref 1.4–7.7)
Neutrophils Relative %: 60.9 % (ref 43.0–77.0)
Platelets: 239 10*3/uL (ref 150.0–400.0)
RBC: 4.71 Mil/uL (ref 3.87–5.11)
RDW: 13.3 % (ref 11.5–15.5)
WBC: 6.1 10*3/uL (ref 4.0–10.5)

## 2023-05-27 LAB — VITAMIN B12: Vitamin B-12: 180 pg/mL — ABNORMAL LOW (ref 211–911)

## 2023-05-27 LAB — TSH: TSH: 0.87 u[IU]/mL (ref 0.35–5.50)

## 2023-05-27 LAB — LIPID PANEL
Cholesterol: 237 mg/dL — ABNORMAL HIGH (ref 0–200)
HDL: 44.3 mg/dL (ref >39.00)
LDL Cholesterol: 163 mg/dL — ABNORMAL HIGH (ref 0–99)
NonHDL: 192.22
Total CHOL/HDL Ratio: 5
Triglycerides: 145 mg/dL (ref 0.0–149.0)
VLDL: 29 mg/dL (ref 0.0–40.0)

## 2023-05-27 LAB — VITAMIN D 25 HYDROXY (VIT D DEFICIENCY, FRACTURES): VITD: 29.14 ng/mL — ABNORMAL LOW (ref 30.00–100.00)

## 2023-05-27 LAB — HEMOGLOBIN A1C: Hgb A1c MFr Bld: 5.9 % (ref 4.6–6.5)

## 2023-06-02 ENCOUNTER — Ambulatory Visit: Payer: Medicare Other

## 2023-06-02 NOTE — Progress Notes (Unsigned)
Subjective:    Patient ID: Ashley Rasmussen, female    DOB: [birth year], 72 y.o.   MRN: [id number]  HPI  Pt presents for annual follow up of chronic medical problems   Wt Readings from Last 3 Encounters:  06/03/23 188 lb 8 oz (85.5 kg)  04/02/23 191 lb (86.6 kg)  03/15/23 191 lb 9.6 oz (86.9 kg)   30.42 kg/m  Trying to loose weight  Has cut out a lot of sugar   Vitals:   06/03/23 0953 06/03/23 1023  BP: (!) 150/90 (!) 146/88  Pulse: 80   Temp: 97.7 F (36.5 C)   SpO2: 97%     Immunization History  Administered Date(s) Administered   Fluad Quad(high Dose 65+) 05/31/2020, 04/30/2022   Fluad Trivalent(High Dose 65+) 06/03/2023   Influenza Split 06/21/2012   Influenza Whole 07/13/2002   Influenza,inj,Quad PF,6+ Mos 05/23/2013, 09/26/2015, 05/22/2016, 06/03/2017, 05/12/2018, 03/21/2019   PFIZER(Purple Top)SARS-COV-2 Vaccination 11/06/2019, 12/05/2019, 06/12/2020   Pneumococcal Conjugate-13 09/26/2015   Pneumococcal Polysaccharide-23 11/24/2016   Td 05/27/2005   Tdap 11/24/2016    There are no preventive care reminders to display for this patient.  Feeling good overall  Flu shot -today    Shingrix-declines for now    Mammogram  11/2022 with MRI  Personal history of breast cancer and strong family history History of right mastectomy (some neuropathic symptoms in mastectomy site)- still sees surgeon Recommended annual bilateral screening mammogram in 10/2023  Self breast exam-no lumps or changes   Neg genetic testing in 2022   Gyn health- no problems    Colon cancer screening  Colonoscopy 03/2018  severe sigmoid diverticulosis (could not advised to cecum)  No recall due to age Did the scan   Bone health  Dexa 03/2008  osteopenia  Sister with osteoporosis  Took tamoxifen in the past  Falls- none  Fractures-none  Supplements  Last vitamin D Lab Results  Component Value Date   VD25OH 29.14 (L) 05/27/2023  Missed it for a month- had trouble finding  pills    Exercise :  Walks on her property  Caring for FIL-is hard to fit in exercise    Derm care  Visit in 03/2023 - nothing new  Mother had melanoma    Mood    06/03/2023    9:58 AM 03/02/2023    9:13 AM 12/03/2021   10:15 AM 09/27/2020   11:34 AM 06/27/2019   11:44 AM  Depression screen PHQ 2/9  Decreased Interest 0 0 0 0 0  Down, Depressed, Hopeless 0 0 0 0 0  PHQ - 2 Score 0 0 0 0 0  Altered sleeping 0      Tired, decreased energy 0      Change in appetite 0      Feeling bad or failure about yourself  0      Trouble concentrating 0      Moving slowly or fidgety/restless 0      Suicidal thoughts 0      PHQ-9 Score 0      Difficult doing work/chores Not difficult at all        GERDi-omeprazole 20 mg daily  Can sometimes skip for 2-3 days   Lab Results  Component Value Date   VITAMINB12 180 (L) 05/27/2023   Also B12 def runs in family   Blood pressure  BP Readings from Last 3 Encounters:  06/03/23 (!) 146/88  04/02/23 (!) 142/84  03/15/23 136/86   Pulse Readings from Last 3  Encounters:  06/03/23 80  04/02/23 76  03/15/23 76   History of SVT Takes metoprolol xl 25 mg daily , skips doses occational because they make her tired  Lopressor prn   Does not check blood pressure at home   Caring for elderly person   Lab Results  Component Value Date   NA 144 05/27/2023   K 3.5 05/27/2023   CO2 28 05/27/2023   GLUCOSE 131 (H) 05/27/2023   BUN 14 05/27/2023   CREATININE 0.81 05/27/2023   CALCIUM 9.4 05/27/2023   GFR 72.23 05/27/2023   GFRNONAA >60 10/16/2020   Hyperlipidemia Lab Results  Component Value Date   CHOL 237 (H) 05/27/2023   CHOL 248 (H) 04/24/2022   CHOL 252 (H) 09/25/2020   Lab Results  Component Value Date   HDL 44.30 05/27/2023   HDL 44.00 04/24/2022   HDL 46.30 09/25/2020   Lab Results  Component Value Date   LDLCALC 163 (H) 05/27/2023   LDLCALC 171 (H) 04/24/2022   LDLCALC 172 (H) 09/25/2020   Lab Results  Component  Value Date   TRIG 145.0 05/27/2023   TRIG 164.0 (H) 04/24/2022   TRIG 169.0 (H) 09/25/2020   Lab Results  Component Value Date   CHOLHDL 5 05/27/2023   CHOLHDL 6 04/24/2022   CHOLHDL 5 09/25/2020   Lab Results  Component Value Date   LDLDIRECT 153.0 11/24/2016   LDLDIRECT 177.0 09/27/2015   LDLDIRECT 140.9 08/09/2012   Intol of statin   Glucose  Lab Results  Component Value Date   HGBA1C 5.9 05/27/2023   Decreased her sugar intake    Patient Active Problem List   Diagnosis Date Noted   Vitamin B12 deficiency 06/03/2023   Elevated blood pressure reading 06/03/2023   Vitamin D deficiency 06/03/2023   Current use of proton pump inhibitor 05/23/2023   SVT (supraventricular tachycardia) (HCC) 02/23/2023   Near syncope 01/27/2023   Dysuria 09/18/2021   Genetic testing 10/24/2020   Family history of breast cancer    Family history of lung cancer    Ductal carcinoma in situ (DCIS) of right breast 10/10/2020   Lichen sclerosus 03/05/2020   Medicare annual wellness visit, subsequent 06/27/2019   Estrogen deficiency 01/26/2018   Elevated glucose level 12/08/2017   Family history of melanoma 11/24/2016   Palpitations    Tachycardia    Hearing loss in left ear 10/02/2015   GERD (gastroesophageal reflux disease) 05/08/2015   Obesity 12/13/2012   Encounter for routine gynecological examination 08/16/2012   Colon cancer screening 08/16/2012   Routine general medical examination at a health care facility 08/08/2012   Uterine prolapse 07/06/2012   BACK PAIN, LUMBAR, WITH RADICULOPATHY 08/15/2010   Hyperlipidemia 04/13/2008   Osteoporosis 04/06/2008   Allergic rhinitis 04/15/2007   BREAST CANCER, HX OF 04/15/2007   Past Medical History:  Diagnosis Date   Allergic rhinitis, cause unspecified    Breast cancer (HCC) 1992   Complication of anesthesia    Family history of breast cancer    HLD (hyperlipidemia)    Osteopenia    Osteoporosis, unspecified    Personal history  of chemotherapy    Personal history of malignant neoplasm of breast    needs yearly mammogram and MRI every 2   Personal history of radiation therapy    PONV (postoperative nausea and vomiting)    Thoracic or lumbosacral neuritis or radiculitis, unspecified    Past Surgical History:  Procedure Laterality Date   BREAST BIOPSY Left 03/2015  BREAST BIOPSY Right 10/09/2020   Breast cancer excision     BREAST LUMPECTOMY Right    COLONOSCOPY WITH PROPOFOL N/A 04/11/2018   Procedure: COLONOSCOPY WITH PROPOFOL;  Surgeon: Wyline Mood, MD;  Location: [facility] ENDOSCOPY;  Service: Gastroenterology;  Laterality: N/A;   LATISSIMUS FLAP TO BREAST     due to radiation burn   MASTECTOMY Right 10/30/2020   TOTAL MASTECTOMY Right 10/30/2020   Procedure: RIGHT MASTECTOMY;  Surgeon: Griselda Miner, MD;  Location: [facility];  Service: General;  Laterality: Right;   Social History   Tobacco Use   Smoking status: Former    Current packs/day: 0.00    Average packs/day: 0.1 packs/day for 2.0 years (0.2 ttl pk-yrs)    Types: Cigarettes    Start date: 07/28/1987    Quit date: 07/27/1989    Years since quitting: 33.8   Smokeless tobacco: Never   Tobacco comments:    quit over 58yrs ago  Vaping Use   Vaping status: Never Used  Substance Use Topics   Alcohol use: No    Alcohol/week: 0.0 standard drinks of alcohol   Drug use: No   Family History  Problem Relation Age of Onset   Other Mother        B-12 deficiency   Breast cancer Mother 39   Melanoma Mother        dx 62s (foot) and 52s (arm)   Myelodysplastic syndrome Mother        dx mid-70s   Osteoporosis Mother    Alcoholism Father    Other Sister        B-12 deficiency   Breast cancer Sister 30       "inconclusive" genetic testing   Osteoporosis Sister    Lung cancer Maternal Uncle        radiation exposure    Cancer Paternal Aunt        unknown type, dx >50   Cancer Paternal Aunt        unknown type, dx >50   Breast  cancer Cousin 26       (maternal first cousin)   Breast cancer Cousin        dx 77s or 68s (maternal first cousin)   Cancer Nephew 23       Ewing's sarcoma   Heart disease Neg Hx    Allergies  Allergen Reactions   Crestor [Rosuvastatin]     Myalgia  Even once weekly dose   Alendronate Sodium Other (See Comments)    REACTION: GI side effects   Nsaids Other (See Comments)    GI upset with oral nsaids   Pravachol Other (See Comments)    myalgias   Raloxifene Other (See Comments)    REACTION: breast tenderness and leg pain   Simvastatin Other (See Comments)    REACTION: myalgias   Current Outpatient Medications on File Prior to Visit  Medication Sig Dispense Refill   acetaminophen (TYLENOL) 500 MG tablet Take 500 mg by mouth every 6 (six) hours as needed.     cholecalciferol (VITAMIN D3) 25 MCG (1000 UNIT) tablet Take 4,000 Units by mouth daily.     clobetasol ointment (TEMOVATE) 0.05 % Apply to affected area every night for 4 weeks, then every other day for 4 weeks and then twice a week for 4 weeks or until resolution. 30 g 3   cyanocobalamin (VITAMIN B12) 1000 MCG tablet Take 1,000 mcg by mouth daily.     metoprolol succinate (TOPROL XL) 25  MG 24 hr tablet Take 1 tablet (25 mg total) by mouth daily. 90 tablet 1   metoprolol tartrate (LOPRESSOR) 25 MG tablet Take 1 tablet by mouth twice daily as needed for Heart Rate greater than 120.     Multiple Vitamin (MULTIVITAMIN) tablet Take 3 tablets by mouth daily.     omeprazole (PRILOSEC) 20 MG capsule TAKE 1 CAPSULE DAILY AS NEEDED 90 capsule 1   No current facility-administered medications on file prior to visit.     Review of Systems  Constitutional:  Negative for activity change, appetite change, fatigue, fever and unexpected weight change.  HENT:  Negative for congestion, ear pain, rhinorrhea, sinus pressure and sore throat.   Eyes:  Negative for pain, redness and visual disturbance.  Respiratory:  Negative for cough, shortness  of breath and wheezing.   Cardiovascular:  Negative for chest pain and palpitations.  Gastrointestinal:  Negative for abdominal pain, blood in stool, constipation and diarrhea.  Endocrine: Negative for polydipsia and polyuria.  Genitourinary:  Negative for dysuria, frequency and urgency.  Musculoskeletal:  Negative for arthralgias, back pain and myalgias.  Skin:  Negative for pallor and rash.  Allergic/Immunologic: Negative for environmental allergies.  Neurological:  Negative for dizziness, syncope and headaches.  Hematological:  Negative for adenopathy. Does not bruise/bleed easily.  Psychiatric/Behavioral:  Negative for decreased concentration and dysphoric mood. The patient is not nervous/anxious.        Objective:   Physical Exam Constitutional:      General: She is not in acute distress.    Appearance: Normal appearance. She is well-developed. She is obese. She is not ill-appearing or diaphoretic.  HENT:     Head: Normocephalic and atraumatic.     Right Ear: Tympanic membrane, ear canal and external ear normal.     Left Ear: Tympanic membrane, ear canal and external ear normal.     Nose: Nose normal. No congestion.     Mouth/Throat:     Mouth: Mucous membranes are moist.     Pharynx: Oropharynx is clear. No posterior oropharyngeal erythema.  Eyes:     General: No scleral icterus.    Extraocular Movements: Extraocular movements intact.     Conjunctiva/sclera: Conjunctivae normal.     Pupils: Pupils are equal, round, and reactive to light.  Neck:     Thyroid: No thyromegaly.     Vascular: No carotid bruit or JVD.  Cardiovascular:     Rate and Rhythm: Normal rate and regular rhythm.     Pulses: Normal pulses.     Heart sounds: Normal heart sounds.     No gallop.  Pulmonary:     Effort: Pulmonary effort is normal. No respiratory distress.     Breath sounds: Normal breath sounds. No wheezing.     Comments: Good air exch Chest:     Chest wall: No tenderness.  Abdominal:      General: Bowel sounds are normal. There is no distension or abdominal bruit.     Palpations: Abdomen is soft. There is no mass.     Tenderness: There is no abdominal tenderness.     Hernia: No hernia is present.  Genitourinary:    Comments: Left Breast exam: No mass, nodules, thickening, tenderness, bulging, retraction, inflamation, nipple discharge or skin changes noted.  No axillary or clavicular LA.      Right mastectomy site is w/o change  Musculoskeletal:        General: No tenderness. Normal range of motion.     Cervical  back: Normal range of motion and neck supple. No rigidity. No muscular tenderness.     Right lower leg: No edema.     Left lower leg: No edema.     Comments: Mild kyphosis   Lymphadenopathy:     Cervical: No cervical adenopathy.  Skin:    General: Skin is warm and dry.     Coloration: Skin is not pale.     Findings: No erythema or rash.     Comments: Solar lentigines diffusely Some sks  Neurological:     Mental Status: She is alert. Mental status is at baseline.     Cranial Nerves: No cranial nerve deficit.     Motor: No abnormal muscle tone.     Coordination: Coordination normal.     Gait: Gait normal.     Deep Tendon Reflexes: Reflexes are normal and symmetric. Reflexes normal.  Psychiatric:        Mood and Affect: Mood normal.        Cognition and Memory: Cognition and memory normal.           Assessment & Plan:   Problem List Items Addressed This Visit       Cardiovascular and Mediastinum   SVT (supraventricular tachycardia) (HCC)    With intermittent palpitations  Not tolerating metoprolol xl well so not compliant Urged pt to check in with cardiology about this  Blood pressure is also elevated  Follow up planned         Digestive   GERD (gastroesophageal reflux disease)    Omeprazole 20 mg daily -pt has tried skipping days   Low B12 and D  Will treat Watching diet         Musculoskeletal and Integument   Osteoporosis     Conitnues to decline dexa  No falls/fracture  D is low will increase dose to 4000 international units D3 daily Discussed fall prevention, supplements and exercise for bone density         Relevant Medications   cholecalciferol (VITAMIN D3) 25 MCG (1000 UNIT) tablet     Other   BREAST CANCER, HX OF    Doing well  Right mastectomy  MRI normal in spring  Mammo due in April Genetic testing neg  Continues surgical f/u      Colon cancer screening    Colonoscopy 03/2018 - could not complete due to severe tics  Did scan -reassuring  No recall      Current use of proton pump inhibitor    Lab Results  Component Value Date   VITAMINB12 180 (L) 05/27/2023   Last vitamin D Lab Results  Component Value Date   VD25OH 29.14 (L) 05/27/2023   Will treat def       Elevated blood pressure reading    BP: (!) 146/88  Will follow up for re check  Not taking metoprolol (for SVT) regularly due to side effects She will follow up with cardiology about that  We may need to add therapy at next visit       Elevated glucose level    Lab Results  Component Value Date   HGBA1C 5.9 05/27/2023   disc imp of low glycemic diet and wt loss to prevent DM2        Family history of melanoma    Utd derm care and screening      Hyperlipidemia - Primary    Disc goals for lipids and reasons to control them Rev last labs with pt Rev  low sat fat diet in detail Not at goal Pt is willing to try zetia again at 10mg  daily  Sent  Will re check at 6 wk f/u      Obesity    Discussed how this problem influences overall health and the risks it imposes  Reviewed plan for weight loss with lower calorie diet (via better food choices (lower glycemic and portion control) along with exercise building up to or more than 30 minutes 5 days per week including some aerobic activity and strength training         Vitamin B12 deficiency    Lab Results  Component Value Date   VITAMINB12 180 (L)  05/27/2023   Shot today  Instructed to take 1000 mcg daily  Re check at f/u      Vitamin D deficiency    Last vitamin D Lab Results  Component Value Date   VD25OH 29.14 (L) 05/27/2023   Will increase D3 to 4000 international units daily  For bone and overall health      Other Visit Diagnoses     Need for influenza vaccination       Relevant Orders   Flu Vaccine Trivalent High Dose (Fluad) (Completed)

## 2023-06-03 ENCOUNTER — Ambulatory Visit (INDEPENDENT_AMBULATORY_CARE_PROVIDER_SITE_OTHER): Payer: Medicare Other | Admitting: Family Medicine

## 2023-06-03 ENCOUNTER — Encounter: Payer: Self-pay | Admitting: Family Medicine

## 2023-06-03 VITALS — BP 146/88 | HR 80 | Temp 97.7°F | Ht 66.0 in | Wt 188.5 lb

## 2023-06-03 DIAGNOSIS — E7849 Other hyperlipidemia: Secondary | ICD-10-CM | POA: Diagnosis not present

## 2023-06-03 DIAGNOSIS — E6609 Other obesity due to excess calories: Secondary | ICD-10-CM | POA: Diagnosis not present

## 2023-06-03 DIAGNOSIS — I471 Supraventricular tachycardia, unspecified: Secondary | ICD-10-CM

## 2023-06-03 DIAGNOSIS — R7309 Other abnormal glucose: Secondary | ICD-10-CM | POA: Diagnosis not present

## 2023-06-03 DIAGNOSIS — Z1211 Encounter for screening for malignant neoplasm of colon: Secondary | ICD-10-CM

## 2023-06-03 DIAGNOSIS — Z808 Family history of malignant neoplasm of other organs or systems: Secondary | ICD-10-CM

## 2023-06-03 DIAGNOSIS — M810 Age-related osteoporosis without current pathological fracture: Secondary | ICD-10-CM

## 2023-06-03 DIAGNOSIS — E559 Vitamin D deficiency, unspecified: Secondary | ICD-10-CM | POA: Diagnosis not present

## 2023-06-03 DIAGNOSIS — K219 Gastro-esophageal reflux disease without esophagitis: Secondary | ICD-10-CM

## 2023-06-03 DIAGNOSIS — R030 Elevated blood-pressure reading, without diagnosis of hypertension: Secondary | ICD-10-CM | POA: Diagnosis not present

## 2023-06-03 DIAGNOSIS — E538 Deficiency of other specified B group vitamins: Secondary | ICD-10-CM | POA: Diagnosis not present

## 2023-06-03 DIAGNOSIS — E66811 Obesity, class 1: Secondary | ICD-10-CM | POA: Diagnosis not present

## 2023-06-03 DIAGNOSIS — Z79899 Other long term (current) drug therapy: Secondary | ICD-10-CM | POA: Diagnosis not present

## 2023-06-03 DIAGNOSIS — Z853 Personal history of malignant neoplasm of breast: Secondary | ICD-10-CM

## 2023-06-03 DIAGNOSIS — Z23 Encounter for immunization: Secondary | ICD-10-CM

## 2023-06-03 MED ORDER — CYANOCOBALAMIN 1000 MCG/ML IJ SOLN
1000.0000 ug | Freq: Once | INTRAMUSCULAR | Status: AC
  Administered 2023-06-03: 1000 ug via INTRAMUSCULAR

## 2023-06-03 NOTE — Assessment & Plan Note (Signed)
Conitnues to decline dexa  No falls/fracture  D is low will increase dose to 4000 international units D3 daily Discussed fall prevention, supplements and exercise for bone density

## 2023-06-03 NOTE — Assessment & Plan Note (Signed)
 Discussed how this problem influences overall health and the risks it imposes  Reviewed plan for weight loss with lower calorie diet (via better food choices (lower glycemic and portion control) along with exercise building up to or more than 30 minutes 5 days per week including some aerobic activity and strength training

## 2023-06-03 NOTE — Assessment & Plan Note (Signed)
Last vitamin D Lab Results  Component Value Date   VD25OH 29.14 (L) 05/27/2023   Will increase D3 to 4000 international units daily  For bone and overall health

## 2023-06-03 NOTE — Assessment & Plan Note (Signed)
Utd derm care and screening

## 2023-06-03 NOTE — Assessment & Plan Note (Signed)
Doing well  Right mastectomy  MRI normal in spring  Mammo due in April Genetic testing neg  Continues surgical f/u

## 2023-06-03 NOTE — Assessment & Plan Note (Signed)
Lab Results  Component Value Date   VITAMINB12 180 (L) 05/27/2023   Last vitamin D Lab Results  Component Value Date   VD25OH 29.14 (L) 05/27/2023   Will treat def

## 2023-06-03 NOTE — Assessment & Plan Note (Signed)
Lab Results  Component Value Date   VITAMINB12 180 (L) 05/27/2023   Shot today  Instructed to take 1000 mcg daily  Re check at f/u

## 2023-06-03 NOTE — Patient Instructions (Addendum)
Talk to your cardiologist about metoprolol  They may need to change to something else   Flu shot today  If you are interested in the new shingles vaccine (Shingrix) - call your local pharmacy to check on coverage and availability   When you are ready to get a bone density test let us know   Increase your vitamin D3 to 4000 international units daily    Add strength training to your exercise  Add some strength training to your routine, this is important for bone and brain health and can reduce your risk of falls and help your body use insulin properly and regulate weight  Light weights, exercise bands , and internet videos are a good way to start  Yoga (chair or regular), machines , floor exercises or a gym with machines are also good options   Your B12 is low  Shot today  Start vitamin B12 over the counter 1000 mcg daily   For cholesterol Try zetia one more time  If you have any side effects stop it and let us know   Follow up in about 6 weeks for blood pressure, we will re check cholesterol and B12 that day

## 2023-06-03 NOTE — Assessment & Plan Note (Signed)
Disc goals for lipids and reasons to control them Rev last labs with pt Rev low sat fat diet in detail Not at goal Pt is willing to try zetia again at 10mg  daily  Sent  Will re check at 6 wk f/u

## 2023-06-03 NOTE — Assessment & Plan Note (Signed)
Colonoscopy 03/2018 - could not complete due to severe tics  Did scan -reassuring  No recall

## 2023-06-03 NOTE — Assessment & Plan Note (Signed)
BP: (!) 146/88  Will follow up for re check  Not taking metoprolol (for SVT) regularly due to side effects She will follow up with cardiology about that  We may need to add therapy at next visit

## 2023-06-03 NOTE — Assessment & Plan Note (Signed)
Lab Results  Component Value Date   HGBA1C 5.9 05/27/2023   disc imp of low glycemic diet and wt loss to prevent DM2

## 2023-06-03 NOTE — Assessment & Plan Note (Signed)
With intermittent palpitations  Not tolerating metoprolol xl well so not compliant Urged pt to check in with cardiology about this  Blood pressure is also elevated  Follow up planned

## 2023-06-03 NOTE — Assessment & Plan Note (Signed)
Omeprazole 20 mg daily -pt has tried skipping days   Low B12 and D  Will treat Watching diet

## 2023-08-09 ENCOUNTER — Telehealth: Payer: Self-pay | Admitting: Family Medicine

## 2023-08-09 DIAGNOSIS — E7849 Other hyperlipidemia: Secondary | ICD-10-CM

## 2023-08-09 DIAGNOSIS — E538 Deficiency of other specified B group vitamins: Secondary | ICD-10-CM

## 2023-08-09 NOTE — Telephone Encounter (Signed)
-----   Message from Alvina Chou sent at 08/09/2023  2:38 PM EST ----- Regarding: Lab orders for TUE, 1.14.25 Lab orders, thanks

## 2023-08-10 ENCOUNTER — Encounter: Payer: Self-pay | Admitting: Family Medicine

## 2023-08-10 ENCOUNTER — Other Ambulatory Visit (INDEPENDENT_AMBULATORY_CARE_PROVIDER_SITE_OTHER): Payer: Medicare Other

## 2023-08-10 DIAGNOSIS — E7849 Other hyperlipidemia: Secondary | ICD-10-CM | POA: Diagnosis not present

## 2023-08-10 DIAGNOSIS — E538 Deficiency of other specified B group vitamins: Secondary | ICD-10-CM | POA: Diagnosis not present

## 2023-08-10 LAB — LIPID PANEL
Cholesterol: 266 mg/dL — ABNORMAL HIGH (ref 0–200)
HDL: 41.7 mg/dL (ref >39.00)
LDL Cholesterol: 188 mg/dL — ABNORMAL HIGH (ref 0–99)
NonHDL: 224.04
Total CHOL/HDL Ratio: 6
Triglycerides: 178 mg/dL — ABNORMAL HIGH (ref 0.0–149.0)
VLDL: 35.6 mg/dL (ref 0.0–40.0)

## 2023-08-10 LAB — VITAMIN B12: Vitamin B-12: 664 pg/mL (ref 211–911)

## 2023-08-12 ENCOUNTER — Telehealth: Payer: Self-pay | Admitting: Family Medicine

## 2023-08-12 MED ORDER — EZETIMIBE 10 MG PO TABS: 10.0000 mg | ORAL_TABLET | Freq: Every day | ORAL | 3 refills | Status: DC

## 2023-08-12 NOTE — Telephone Encounter (Signed)
Copied from CRM [id number]. Topic: Clinical - Medication Refill >> Aug 12, 2023 10:50 AM Pascal Lux wrote: Most Recent Primary Care Visit:  Provider: Roxy Manns A  Department: [facility]  Visit Type: PHYSICAL  Date: 06/03/2023  Medication: Generic Zetia   Has the patient contacted their pharmacy? Yes (Agent: If no, request that the patient contact the pharmacy for the refill. If patient does not wish to contact the pharmacy document the reason why and proceed with request.) (Agent: If yes, when and what did the pharmacy advise?)  Is this the correct pharmacy for this prescription? Yes If no, delete pharmacy and type the correct one.  This is the patient's preferred pharmacy:  [facility] [phone] [facility] [place] - [address] [place] [address] Phone: [phone] Fax: [phone]   Has the prescription been filled recently? No  Is the patient out of the medication? Yes  Has the patient been seen for an appointment in the last year OR does the patient have an upcoming appointment? Yes  Can we respond through MyChart? Yes  Agent: Please be advised that Rx refills may take up to 3 business days. We ask that you follow-up with your pharmacy.

## 2023-08-12 NOTE — Telephone Encounter (Signed)
Being address in myChart patient message routed to MD.

## 2023-08-27 ENCOUNTER — Other Ambulatory Visit: Payer: Self-pay | Admitting: Cardiology

## 2023-08-31 ENCOUNTER — Other Ambulatory Visit: Payer: Self-pay | Admitting: *Deleted

## 2023-08-31 MED ORDER — EZETIMIBE 10 MG PO TABS: 10.0000 mg | ORAL_TABLET | Freq: Every day | ORAL | 3 refills | Status: AC

## 2023-09-08 ENCOUNTER — Other Ambulatory Visit: Payer: Self-pay | Admitting: Family Medicine

## 2023-09-08 NOTE — Telephone Encounter (Signed)
Looks like cardiology fills this med, will route to them, looks like it was recently filled but went to local pharmacy and not says pt wants it sent to mail order

## 2023-09-08 NOTE — Telephone Encounter (Signed)
Last Fill: 02/23/23  Last OV: 06/03/23 Next OV: 03/06/24 AWV  Routing to provider for review/authorization.

## 2023-09-08 NOTE — Telephone Encounter (Signed)
Copied from CRM [id number]. Topic: Clinical - Medication Refill >> Sep 08, 2023  4:26 PM Orinda Kenner C wrote: Most Recent Primary Care Visit:  Provider: [facility]  Department: [facility]  Visit Type: LAB VISIT  Date: 08/10/2023  Medication: metoprolol succinate (TOPROL-XL) 25 MG 24 hr tablet   Has the patient contacted their pharmacy? Yes. Hazel from [facility] [phone] states patient has a limit supply at risk of running out of medications  (Agent: If no, request that the patient contact the pharmacy for the refill. If patient does not wish to contact the pharmacy document the reason why and proceed with request.) (Agent: If yes, when and what did the pharmacy advise?)  Is this the correct pharmacy for this prescription? Yes If no, delete pharmacy and type the correct one.  This is the patient's preferred pharmacy:  [facility] - [place], [place] - [address] [place] [address] Phone: [phone] Fax: [phone]   Has the prescription been filled recently? No  Is the patient out of the medication? Yes  Has the patient been seen for an appointment in the last year OR does the patient have an upcoming appointment? Yes  Can we respond through MyChart?   Agent: Please be advised that Rx refills may take up to 3 business days. We ask that you follow-up with your pharmacy.

## 2023-09-09 MED ORDER — METOPROLOL SUCCINATE ER 25 MG PO TB24
25.0000 mg | ORAL_TABLET | Freq: Every day | ORAL | 2 refills | Status: DC
Start: 2023-09-09 — End: 2023-11-18

## 2023-09-09 MED ORDER — METOPROLOL SUCCINATE ER 25 MG PO TB24: 25.0000 mg | ORAL_TABLET | Freq: Every day | ORAL | 1 refills | Status: DC

## 2023-09-23 ENCOUNTER — Telehealth: Payer: Self-pay | Admitting: Cardiology

## 2023-09-23 NOTE — Telephone Encounter (Signed)
 Disp Refills Start End   metoprolol succinate (TOPROL-XL) 25 MG 24 hr tablet 90 tablet 2 09/09/2023 --   Sig - Route: Take 1 tablet (25 mg total) by mouth daily. - Oral   Sent to pharmacy as: metoprolol succinate (TOPROL-XL) 25 MG 24 hr tablet   Notes to Pharmacy: Please contact office for future appointment and refills, Thank you [phone]   E-Prescribing Status: Receipt confirmed by pharmacy (09/09/2023 11:21 AM EST)    Called the pharmacy to inquire about the patient's prescription. The pharmacy staff member states that the prescription was filled, but the patient did not come to pick up her medication. The pharmacy staff member states that they will refill the medication again.

## 2023-09-23 NOTE — Telephone Encounter (Signed)
*  STAT* If patient is at the pharmacy, call can be transferred to refill team.   1. Which medications need to be refilled? (please list name of each medication and dose if known) metoprolol succinate (TOPROL-XL) 25 MG 24 hr tablet   2. Which pharmacy/location (including street and city if local pharmacy) is medication to be sent to?  [id number] - [place], [place] - [address]    3. Do they need a 30 day or 90 day supply? 90   Patient states pharmacy did not receive the refill. Requesting it be sent again.

## 2023-09-27 ENCOUNTER — Other Ambulatory Visit: Payer: Self-pay | Admitting: Family Medicine

## 2023-09-27 DIAGNOSIS — Z1231 Encounter for screening mammogram for malignant neoplasm of breast: Secondary | ICD-10-CM

## 2023-10-27 ENCOUNTER — Encounter: Payer: Self-pay | Admitting: Family Medicine

## 2023-10-27 ENCOUNTER — Ambulatory Visit (INDEPENDENT_AMBULATORY_CARE_PROVIDER_SITE_OTHER): Admitting: Family Medicine

## 2023-10-27 VITALS — BP 140/82 | HR 79 | Temp 98.2°F | Ht 66.0 in | Wt 190.5 lb

## 2023-10-27 DIAGNOSIS — M1711 Unilateral primary osteoarthritis, right knee: Secondary | ICD-10-CM | POA: Diagnosis not present

## 2023-10-27 NOTE — Progress Notes (Signed)
 Subjective:    Patient ID: Ashley Rasmussen, female    DOB: [birth year], 73 y.o.   MRN: [id number]  HPI  Wt Readings from Last 3 Encounters:  10/27/23 190 lb 8 oz (86.4 kg)  06/03/23 188 lb 8 oz (85.5 kg)  04/02/23 191 lb (86.6 kg)   30.75 kg/m  Vitals:   10/27/23 1225 10/27/23 1246  BP: (!) 152/90 (!) 140/82  Pulse: 79   Temp: 98.2 F (36.8 C)   SpO2: 97%    Pt presents for acute on chronic  right knee pain  2 weeks  Pain is worse Stinging and sharp  Does not feel like it is going to go out on her  More clicking and popping  Hurts badly to walk  Using a support brace  Using voltaren gel      Last knee film from 2020   EXAM: RIGHT KNEE - COMPLETE 4+ VIEW   COMPARISON:  None.   FINDINGS: Mild to moderate medial tibiofemoral joint degenerative changes.   Minimal patellofemoral joint degenerative changes.   No fracture or dislocation. No joint effusion. Limited imaging left knee.   IMPRESSION: 1. Mild to moderate right medial tibiofemoral joint degenerative changes (best appreciated on [place] view). 2. Minimal right patellofemoral joint degenerative changes.   Saw ortho at the time  PA Floyce Stakes at [facility]  They referred to PT     Patient Active Problem List   Diagnosis Date Noted   Arthritis of right knee 10/27/2023   Vitamin B12 deficiency 06/03/2023   Elevated blood pressure reading 06/03/2023   Vitamin D deficiency 06/03/2023   Current use of proton pump inhibitor 05/23/2023   SVT (supraventricular tachycardia) (HCC) 02/23/2023   Near syncope 01/27/2023   Dysuria 09/18/2021   Genetic testing 10/24/2020   Family history of breast cancer    Family history of lung cancer    Ductal carcinoma in situ (DCIS) of right breast 10/10/2020   Lichen sclerosus 03/05/2020   Medicare annual wellness visit, subsequent 06/27/2019   Estrogen deficiency 01/26/2018   Elevated glucose level 12/08/2017   Family history of melanoma 11/24/2016   Palpitations     Tachycardia    Hearing loss in left ear 10/02/2015   GERD (gastroesophageal reflux disease) 05/08/2015   Obesity 12/13/2012   Encounter for routine gynecological examination 08/16/2012   Colon cancer screening 08/16/2012   Routine general medical examination at a health care facility 08/08/2012   Uterine prolapse 07/06/2012   BACK PAIN, LUMBAR, WITH RADICULOPATHY 08/15/2010   Hyperlipidemia 04/13/2008   Osteoporosis 04/06/2008   Allergic rhinitis 04/15/2007   BREAST CANCER, HX OF 04/15/2007   Past Medical History:  Diagnosis Date   Allergic rhinitis, cause unspecified    Breast cancer (HCC) 1992   Complication of anesthesia    Family history of breast cancer    HLD (hyperlipidemia)    Osteopenia    Osteoporosis, unspecified    Personal history of chemotherapy    Personal history of malignant neoplasm of breast    needs yearly mammogram and MRI every 2   Personal history of radiation therapy    PONV (postoperative nausea and vomiting)    Thoracic or lumbosacral neuritis or radiculitis, unspecified    Past Surgical History:  Procedure Laterality Date   BREAST BIOPSY Left 03/2015   BREAST BIOPSY Right 10/09/2020   Breast cancer excision     BREAST LUMPECTOMY Right    COLONOSCOPY WITH PROPOFOL N/A 04/11/2018   Procedure: COLONOSCOPY WITH PROPOFOL;  Surgeon: Wyline Mood, MD;  Location: [facility] ENDOSCOPY;  Service: Gastroenterology;  Laterality: N/A;   LATISSIMUS FLAP TO BREAST     due to radiation burn   MASTECTOMY Right 10/30/2020   TOTAL MASTECTOMY Right 10/30/2020   Procedure: RIGHT MASTECTOMY;  Surgeon: Griselda Miner, MD;  Location: [facility];  Service: General;  Laterality: Right;   Social History   Tobacco Use   Smoking status: Former    Current packs/day: 0.00    Average packs/day: 0.1 packs/day for 2.0 years (0.2 ttl pk-yrs)    Types: Cigarettes    Start date: 07/28/1987    Quit date: 07/27/1989    Years since quitting: 34.2   Smokeless tobacco:  Never   Tobacco comments:    quit over 50yrs ago  Vaping Use   Vaping status: Never Used  Substance Use Topics   Alcohol use: No    Alcohol/week: 0.0 standard drinks of alcohol   Drug use: No   Family History  Problem Relation Age of Onset   Other Mother        B-12 deficiency   Breast cancer Mother 65   Melanoma Mother        dx 58s (foot) and 9s (arm)   Myelodysplastic syndrome Mother        dx mid-70s   Osteoporosis Mother    Alcoholism Father    Other Sister        B-12 deficiency   Breast cancer Sister 58       "inconclusive" genetic testing   Osteoporosis Sister    Lung cancer Maternal Uncle        radiation exposure    Cancer Paternal Aunt        unknown type, dx >50   Cancer Paternal Aunt        unknown type, dx >50   Breast cancer Cousin 65       (maternal first cousin)   Breast cancer Cousin        dx 69s or 78s (maternal first cousin)   Cancer Nephew 23       Ewing's sarcoma   Heart disease Neg Hx    Allergies  Allergen Reactions   Crestor [Rosuvastatin]     Myalgia  Even once weekly dose   Alendronate Sodium Other (See Comments)    REACTION: GI side effects   Nsaids Other (See Comments)    GI upset with oral nsaids   Pravachol Other (See Comments)    myalgias   Raloxifene Other (See Comments)    REACTION: breast tenderness and leg pain   Simvastatin Other (See Comments)    REACTION: myalgias   Current Outpatient Medications on File Prior to Visit  Medication Sig Dispense Refill   acetaminophen (TYLENOL) 500 MG tablet Take 500 mg by mouth every 6 (six) hours as needed.     cholecalciferol (VITAMIN D3) 25 MCG (1000 UNIT) tablet Take 4,000 Units by mouth daily.     clobetasol ointment (TEMOVATE) 0.05 % Apply to affected area every night for 4 weeks, then every other day for 4 weeks and then twice a week for 4 weeks or until resolution. 30 g 3   cyanocobalamin (VITAMIN B12) 1000 MCG tablet Take 1,000 mcg by mouth daily.     ezetimibe (ZETIA) 10  MG tablet Take 1 tablet (10 mg total) by mouth daily. 90 tablet 3   metoprolol succinate (TOPROL-XL) 25 MG 24 hr tablet Take 1 tablet (25 mg total) by mouth  daily. 90 tablet 2   metoprolol tartrate (LOPRESSOR) 25 MG tablet Take 1 tablet by mouth twice daily as needed for Heart Rate greater than 120.     Multiple Vitamin (MULTIVITAMIN) tablet Take 3 tablets by mouth daily.     omeprazole (PRILOSEC) 20 MG capsule TAKE 1 CAPSULE DAILY AS NEEDED 90 capsule 1   No current facility-administered medications on file prior to visit.    Review of Systems  Constitutional:  Negative for activity change, appetite change, fatigue, fever and unexpected weight change.  HENT:  Negative for congestion, rhinorrhea, sore throat and trouble swallowing.   Eyes:  Negative for pain, redness, itching and visual disturbance.  Respiratory:  Negative for cough, chest tightness, shortness of breath and wheezing.   Cardiovascular:  Negative for chest pain and palpitations.  Gastrointestinal:  Negative for abdominal pain, blood in stool, constipation, diarrhea and nausea.  Endocrine: Negative for cold intolerance, heat intolerance, polydipsia and polyuria.  Genitourinary:  Negative for difficulty urinating, dysuria, frequency and urgency.  Musculoskeletal:  Positive for arthralgias. Negative for joint swelling and myalgias.       Right knee pain  Worse with walking  Also with prolonged sitting   Skin:  Negative for pallor and rash.  Neurological:  Negative for dizziness, tremors, weakness, numbness and headaches.  Hematological:  Negative for adenopathy. Does not bruise/bleed easily.  Psychiatric/Behavioral:  Negative for decreased concentration and dysphoric mood. The patient is not nervous/anxious.        Objective:   Physical Exam Constitutional:      General: She is not in acute distress.    Appearance: She is well-developed.  HENT:     Head: Normocephalic and atraumatic.  Eyes:     Conjunctiva/sclera:  Conjunctivae normal.     Pupils: Pupils are equal, round, and reactive to light.  Neck:     Thyroid: No thyromegaly.     Vascular: No carotid bruit or JVD.  Cardiovascular:     Rate and Rhythm: Normal rate and regular rhythm.     Heart sounds: Normal heart sounds.     No gallop.  Pulmonary:     Effort: Pulmonary effort is normal. No respiratory distress.  Abdominal:     General: There is no abdominal bruit.     Palpations: Abdomen is soft.  Musculoskeletal:     Cervical back: Normal range of motion and neck supple.     Right lower leg: No edema.     Left lower leg: No edema.     Comments: Knee right No swelling or effusion  No warmth to the touch  Moderate crepitus  ROM:  Flex full with pain  Ext full Mcmurray-mild discomfort  Bounce test -normal  Stability: Anterior drawer-normal  Lachman exam -normal   Tenderness mostly in medial joint line and patellofemoral area   Gait favors left side      Lymphadenopathy:     Cervical: No cervical adenopathy.  Skin:    General: Skin is warm and dry.     Coloration: Skin is not pale.     Findings: No rash.  Neurological:     Mental Status: She is alert.     Coordination: Coordination normal.     Deep Tendon Reflexes: Reflexes are normal and symmetric. Reflexes normal.  Psychiatric:        Mood and Affect: Mood normal.           Assessment & Plan:   Problem List Items Addressed This Visit  Musculoskeletal and Integument   Arthritis of right knee - Primary   Ongoing since 2020 , reviewed last xray and ortho visit with PA Gaines Tibiofemoral and patellofemoral at changes on last film  Pt is unsure if she did PT Wearing brace/ using voltaren gel Does not tolerate oral nsaids   Ref to ortho  Encouraged use of ice  May be candidate for injection  Unsure how close to knee replacement need at this point

## 2023-10-27 NOTE — Patient Instructions (Addendum)
 Continue the voltaren gel and knee support  Tylenol is ok also   I think it is time to see orthopedics for the knee arthritis   I put the referral in for orthopedics  Please let us know if you don't hear in 1-2 weeks

## 2023-10-27 NOTE — Assessment & Plan Note (Addendum)
 Ongoing since 2020 , reviewed last xray and ortho visit with PA Gaines Tibiofemoral and patellofemoral at changes on last film  Pt is unsure if she did PT Wearing brace/ using voltaren gel Does not tolerate oral nsaids   Ref to ortho  Encouraged use of ice  May be candidate for injection  Unsure how close to knee replacement need at this point

## 2023-11-01 ENCOUNTER — Telehealth: Payer: Self-pay

## 2023-11-01 DIAGNOSIS — G8929 Other chronic pain: Secondary | ICD-10-CM

## 2023-11-01 NOTE — Telephone Encounter (Signed)
 Called patient and advised her to have x rays done at [facility] before appointment on Thursday.  She stated she would be going tomorrow

## 2023-11-02 ENCOUNTER — Telehealth: Payer: Self-pay

## 2023-11-02 ENCOUNTER — Ambulatory Visit
Admission: RE | Admit: 2023-11-02 | Discharge: 2023-11-02 | Disposition: A | Discharge status: Home / Self Care | Source: Ambulatory Visit | Attending: Orthopedic Surgery | Admitting: Orthopedic Surgery

## 2023-11-02 ENCOUNTER — Ambulatory Visit
Admission: RE | Admit: 2023-11-02 | Discharge: 2023-11-02 | Disposition: A | Discharge status: Home / Self Care | Attending: Physician Assistant | Admitting: Physician Assistant

## 2023-11-02 DIAGNOSIS — M25561 Pain in right knee: Secondary | ICD-10-CM | POA: Diagnosis not present

## 2023-11-02 DIAGNOSIS — G8929 Other chronic pain: Secondary | ICD-10-CM | POA: Diagnosis not present

## 2023-11-02 DIAGNOSIS — M1711 Unilateral primary osteoarthritis, right knee: Secondary | ICD-10-CM | POA: Diagnosis not present

## 2023-11-02 NOTE — Addendum Note (Signed)
 Addended by: Michaele Offer on: 11/02/2023 11:00 AM   Modules accepted: Orders

## 2023-11-02 NOTE — Telephone Encounter (Signed)
 Ashley Rasmussen

## 2023-11-04 ENCOUNTER — Ambulatory Visit: Admitting: Orthopedic Surgery

## 2023-11-04 VITALS — BP 180/111 | HR 87 | Ht 66.0 in | Wt 190.0 lb

## 2023-11-04 DIAGNOSIS — M1711 Unilateral primary osteoarthritis, right knee: Secondary | ICD-10-CM

## 2023-11-04 NOTE — Patient Instructions (Signed)

## 2023-11-05 ENCOUNTER — Encounter: Payer: Self-pay | Admitting: Orthopedic Surgery

## 2023-11-05 NOTE — Progress Notes (Signed)
 New Patient Visit  Assessment: TASHEKA HOUSEMAN is a 73 y.o. female with the following: 1. Arthritis of right knee  Plan: [name] has pain in her right knee.  Radiographs demonstrates some mild to moderate degenerative changes.  We discussed multiple treatment options.  She is interested in a steroid injection.  This was completed in clinic today.  She will follow-up as needed.  Procedure note injection Right knee joint   Verbal consent was obtained to inject the right knee joint  Timeout was completed to confirm the site of injection.  The skin was prepped with alcohol and ethyl chloride was sprayed at the injection site.  A 21-gauge needle was used to inject 40 mg of Depo-Medrol and 1% lidocaine (4 cc) into the right knee using an anterolateral approach.  There were no complications. A sterile bandage was applied.   Follow-up: Return if symptoms worsen or fail to improve.  Subjective:  Chief Complaint  Patient presents with   Knee Pain    Patient having right knee pain   NDC  0571-0720-01    History of Present Illness: FATOUMATA ALBAUGH is a 73 y.o. female who has been referred by  Idamae Schuller Tower,MD for evaluation of right knee pain.  She states that she has been having right knee pain intermittently for a while.  Currently, her pain is getting worse.  Most the time it is tolerable.  Medications have not been effective.  She has not had an injection.  She denies a specific history of an injury.  She does not notice any swelling.   Review of Systems: No fevers or chills No numbness or tingling No chest pain No shortness of breath No bowel or bladder dysfunction No GI distress No headaches   Medical History:  Past Medical History:  Diagnosis Date   Allergic rhinitis, cause unspecified    Breast cancer (HCC) 1992   Complication of anesthesia    Family history of breast cancer    HLD (hyperlipidemia)    Osteopenia    Osteoporosis, unspecified    Personal history of  chemotherapy    Personal history of malignant neoplasm of breast    needs yearly mammogram and MRI every 2   Personal history of radiation therapy    PONV (postoperative nausea and vomiting)    Thoracic or lumbosacral neuritis or radiculitis, unspecified     Past Surgical History:  Procedure Laterality Date   BREAST BIOPSY Left 03/2015   BREAST BIOPSY Right 10/09/2020   Breast cancer excision     BREAST LUMPECTOMY Right    COLONOSCOPY WITH PROPOFOL N/A 04/11/2018   Procedure: COLONOSCOPY WITH PROPOFOL;  Surgeon: Wyline Mood, MD;  Location: [facility];  Service: Gastroenterology;  Laterality: N/A;   LATISSIMUS FLAP TO BREAST     due to radiation burn   MASTECTOMY Right 10/30/2020   TOTAL MASTECTOMY Right 10/30/2020   Procedure: RIGHT MASTECTOMY;  Surgeon: Griselda Miner, MD;  Location: [facility];  Service: General;  Laterality: Right;    Family History  Problem Relation Age of Onset   Other Mother        B-12 deficiency   Breast cancer Mother 46   Melanoma Mother        dx 60s (foot) and 92s (arm)   Myelodysplastic syndrome Mother        dx mid-70s   Osteoporosis Mother    Alcoholism Father    Other Sister  B-12 deficiency   Breast cancer Sister 43       "inconclusive" genetic testing   Osteoporosis Sister    Lung cancer Maternal Uncle        radiation exposure    Cancer Paternal Aunt        unknown type, dx >50   Cancer Paternal Aunt        unknown type, dx >50   Breast cancer Cousin 23       (maternal first cousin)   Breast cancer Cousin        dx 5s or 53s (maternal first cousin)   Cancer Nephew 23       Ewing's sarcoma   Heart disease Neg Hx    Social History   Tobacco Use   Smoking status: Former    Current packs/day: 0.00    Average packs/day: 0.1 packs/day for 2.0 years (0.2 ttl pk-yrs)    Types: Cigarettes    Start date: 07/28/1987    Quit date: 07/27/1989    Years since quitting: 34.2   Smokeless tobacco: Never   Tobacco  comments:    quit over 84yrs ago  Vaping Use   Vaping status: Never Used  Substance Use Topics   Alcohol use: No    Alcohol/week: 0.0 standard drinks of alcohol   Drug use: No    Allergies  Allergen Reactions   Crestor [Rosuvastatin]     Myalgia  Even once weekly dose   Alendronate Sodium Other (See Comments)    REACTION: GI side effects   Nsaids Other (See Comments)    GI upset with oral nsaids   Pravachol Other (See Comments)    myalgias   Raloxifene Other (See Comments)    REACTION: breast tenderness and leg pain   Simvastatin Other (See Comments)    REACTION: myalgias    Current Meds  Medication Sig   acetaminophen (TYLENOL) 500 MG tablet Take 500 mg by mouth every 6 (six) hours as needed.   cholecalciferol (VITAMIN D3) 25 MCG (1000 UNIT) tablet Take 4,000 Units by mouth daily.   clobetasol ointment (TEMOVATE) 0.05 % Apply to affected area every night for 4 weeks, then every other day for 4 weeks and then twice a week for 4 weeks or until resolution.   cyanocobalamin (VITAMIN B12) 1000 MCG tablet Take 1,000 mcg by mouth daily.   ezetimibe (ZETIA) 10 MG tablet Take 1 tablet (10 mg total) by mouth daily.   metoprolol succinate (TOPROL-XL) 25 MG 24 hr tablet Take 1 tablet (25 mg total) by mouth daily.   metoprolol tartrate (LOPRESSOR) 25 MG tablet Take 1 tablet by mouth twice daily as needed for Heart Rate greater than 120.   Multiple Vitamin (MULTIVITAMIN) tablet Take 3 tablets by mouth daily.   omeprazole (PRILOSEC) 20 MG capsule TAKE 1 CAPSULE DAILY AS NEEDED    Objective: BP (!) 180/111   Pulse 87   Ht 5\' 6"  (1.676 m)   Wt 190 lb (86.2 kg)   BMI 30.67 kg/m   Physical Exam:  General: Alert and oriented. and No acute distress. Gait: Right sided antalgic gait.  Right knee without swelling.  Mild tenderness palpation along the medial joint line.  She has good range of motion.  Negative Lachman.  No increased laxity varus or valgus stress.  IMAGING: I  personally ordered and reviewed the following images  X-rays of the right knee were available in clinic today.  Mild to moderate degenerative changes overall.  Loss of joint  space within the medial compartment.   New Medications:  No orders of the defined types were placed in this encounter.     Oliver Barre, MD  11/05/2023 9:46 AM

## 2023-11-11 ENCOUNTER — Ambulatory Visit
Admission: RE | Admit: 2023-11-11 | Discharge: 2023-11-11 | Disposition: A | Discharge status: Home / Self Care | Source: Ambulatory Visit | Attending: Family Medicine | Admitting: Family Medicine

## 2023-11-11 DIAGNOSIS — Z1231 Encounter for screening mammogram for malignant neoplasm of breast: Secondary | ICD-10-CM

## 2023-11-16 ENCOUNTER — Encounter: Payer: Self-pay | Admitting: Family Medicine

## 2023-11-17 DIAGNOSIS — L570 Actinic keratosis: Secondary | ICD-10-CM | POA: Diagnosis not present

## 2023-11-17 DIAGNOSIS — D225 Melanocytic nevi of trunk: Secondary | ICD-10-CM | POA: Diagnosis not present

## 2023-11-17 DIAGNOSIS — D2262 Melanocytic nevi of left upper limb, including shoulder: Secondary | ICD-10-CM | POA: Diagnosis not present

## 2023-11-17 DIAGNOSIS — D2272 Melanocytic nevi of left lower limb, including hip: Secondary | ICD-10-CM | POA: Diagnosis not present

## 2023-11-17 DIAGNOSIS — D2271 Melanocytic nevi of right lower limb, including hip: Secondary | ICD-10-CM | POA: Diagnosis not present

## 2023-11-17 DIAGNOSIS — L821 Other seborrheic keratosis: Secondary | ICD-10-CM | POA: Diagnosis not present

## 2023-11-17 DIAGNOSIS — D2261 Melanocytic nevi of right upper limb, including shoulder: Secondary | ICD-10-CM | POA: Diagnosis not present

## 2023-11-18 ENCOUNTER — Encounter: Payer: Self-pay | Admitting: Medical

## 2023-11-18 ENCOUNTER — Ambulatory Visit: Attending: Medical | Admitting: Medical

## 2023-11-18 VITALS — BP 140/86 | HR 92 | Ht 66.0 in | Wt 191.4 lb

## 2023-11-18 DIAGNOSIS — I471 Supraventricular tachycardia, unspecified: Secondary | ICD-10-CM | POA: Insufficient documentation

## 2023-11-18 MED ORDER — METOPROLOL SUCCINATE ER 50 MG PO TB24: 50.0000 mg | ORAL_TABLET | Freq: Every day | ORAL | 3 refills | Status: DC

## 2023-11-18 NOTE — Progress Notes (Signed)
 Cardiology Office Note:  .   Date:  11/18/2023  ID:  Ashley Rasmussen, DOB 08/19/1950, MRN [id number] PCP: Clemens Curt, MD  [facility] Providers Cardiologist:  Sammy Crisp, MD Electrophysiologist:  Boyce Byes, MD {  History of Present Illness: .   Ashley Rasmussen is a 73 y.o. female with a history of symptomatic PSVT, right sided breast cancer status post lobectomy in 1992, status postmastectomy 2022, left-sided cholesteatoma who presents for follow-up of SVT.  Lexiscan  Myoview  in 2018 showed no evidence of infarct or ischemia, EF greater than 65%, overall low risk.  She was evaluated as a new patient in July 2024 for tachypalpitations.  Heart monitor showed normal sinus rhythm with 62 episodes of SVT lasting up to 1 minute and 46 seconds.  The longest episode was felt to possibly represent A-fib as a tachyarrhythmia.  Patient triggered events corresponded to normal sinus rhythm, PACs, PVCs, PSVT.  Patient was referred to EP.  Echo showed EF of 60 to 65%, no wall motion abnormalities, grade 1 diastolic dysfunction.  She saw EP and continued to note palpitations with associated lightheadedness and dizziness.  She was taking Lopressor  1-2 times per week.  Arrhythmia was not felt to represent A-fib with recommendation for patient to start Toprol .  Patient saw EP 04/02/2023 reporting improvement of symptoms.  Today, the patient reports she is overall doing well. She denies chest pain, heart racing, fluttering, SOB. She feels she may have viral GI illness coming on. BP is elevated today.    Studies Reviewed: Aaron Aas   EKG Interpretation Date/Time:  Thursday November 18 2023 10:23:07 EDT Ventricular Rate:  92 PR Interval:  164 QRS Duration:  82 QT Interval:  354 QTC Calculation: 437 R Axis:   40  Text Interpretation: Normal sinus rhythm Possible Left atrial enlargement Nonspecific ST abnormality When compared with ECG of 02-Apr-2023 10:58, No significant change was found Confirmed by  Gennaro Khat, Charlen Bakula ([id number]) on 11/18/2023 10:31:16 AM    Echo 01/2023 1. Left ventricular ejection fraction, by estimation, is 60 to 65%. The  left ventricle has normal function. The left ventricle has no regional  wall motion abnormalities. Left ventricular diastolic parameters are  consistent with Grade I diastolic  dysfunction (impaired relaxation). The average left ventricular global  longitudinal strain is -24.8 %.   2. Right ventricular systolic function is normal. The right ventricular  size is normal.   3. The mitral valve is normal in structure. No evidence of mitral valve  regurgitation. No evidence of mitral stenosis.   4. The aortic valve is normal in structure. Aortic valve regurgitation is  not visualized. No aortic stenosis is present.   5. The inferior vena cava is normal in size with greater than 50%  respiratory variability, suggesting right atrial pressure of 3 mmHg.   Heart monitor 01/2023   The patient was monitored for 7 days, 16 hours.   The predominant rhythm was sinus with an average rate of 81 bpm (range 31-144 bpm in sinus).   There were rare PACs and PVCs.   62 supraventricular runs were observed, lasting up to 1 minute, 46 seconds with a maximum rate of 231 bpm.  The longest episode could represent atrial fibrillation, as the tachyarrhythmia appears irregular.   No prolonged pause was observed.   Patient triggered events corresponded to sinus rhythm, PACs, PVCs, and PSVT.   Predominantly sinus rhythm with multiple episodes of SVT.  Longest episode lasted 1 minute, 46 seconds send: Due  to irregularity in the available tracing, paroxysmal atrial fibrillation cannot be excluded.  Physical Exam:   VS:  BP (!) 140/86   Pulse 92   Ht 5\' 6"  (1.676 m)   Wt 191 lb 6.4 oz (86.8 kg)   SpO2 96%   BMI 30.89 kg/m    Wt Readings from Last 3 Encounters:  11/18/23 191 lb 6.4 oz (86.8 kg)  11/04/23 190 lb (86.2 kg)  10/27/23 190 lb 8 oz (86.4 kg)    GEN: Well nourished,  well developed in no acute distress NECK: No JVD; No carotid bruits CARDIAC: RRR, no murmurs, rubs, gallops RESPIRATORY:  Clear to auscultation without rales, wheezing or rhonchi  ABDOMEN: Soft, non-tender, non-distended EXTREMITIES:  No edema; No deformity   ASSESSMENT AND PLAN: .    pSVT/palpitations The patient denies palpitations. EKG shows NSR with HR in the 90s. BP is high. I will increase Toprol  to 25mg  daily.   HTN I will increase Toprol  to 50mg  daily as above.        Dispo: Follow-up 6 months  Signed, Kushal Saunders Rebekah Canada, PA-C

## 2023-11-18 NOTE — Patient Instructions (Signed)
 Medication Instructions:  Your physician recommends the following medication changes.  INCREASE: Metoprolol  succinate  to 50 mg daily *If you need a refill on your cardiac medications before your next appointment, please call your pharmacy*  Lab Work: No labs ordered today  If you have labs (blood work) drawn today and your tests are completely normal, you will receive your results only by: MyChart Message (if you have MyChart) OR A paper copy in the mail If you have any lab test that is abnormal or we need to change your treatment, we will call you to review the results.  Testing/Procedures: No test ordered today   Follow-Up: At [facility], you and your health needs are our priority.  As part of our continuing mission to provide you with exceptional heart care, our providers are all part of one team.  This team includes your primary Cardiologist (physician) and Advanced Practice Providers or APPs (Physician Assistants and Nurse Practitioners) who all work together to provide you with the care you need, when you need it.  Your next appointment:   6 month(s)  Provider:   Sammy Crisp, MD or Cadence Gennaro Khat, PA-C

## 2023-11-22 ENCOUNTER — Telehealth: Payer: Self-pay | Admitting: Orthopedic Surgery

## 2023-11-22 NOTE — Telephone Encounter (Signed)
 Dr. Ernesta Heading pt - pt stated that she got an injection 4/24 and ever since then she has been getting a lot of pain.  She would like to know what he recommends she does.

## 2023-11-22 NOTE — Telephone Encounter (Signed)
 DR Ernesta Heading AVS instructions are  : In clinic today, you received an injection in one of your joints (sometimes more than one).  Occasionally, you can have some pain at the injection site, this is normal.  You can place ice at the injection site, or take over-the-counter medications such as Tylenol  (acetaminophen ) or Advil (ibuprofen).  Please follow all directions listed on the bottle.   If your joint (knee or shoulder) becomes swollen, red or very painful, please contact the clinic for additional assistance.    Two medications were injected, including lidocaine  and a steroid (often referred to as cortisone).  Lidocaine  is effective almost immediately but wears off quickly.  However, the steroid can take a few days to improve your symptoms.  In some cases, it can make your pain worse for a couple of days.  Do not be concerned if this happens as it is common.  You can apply ice or take some over-the-counter medications as needed.    Injections in the same joint cannot be repeated for 3 months.  This helps to limit the risk of an infection in the joint.  If you were to develop an infection in your joint, the best treatment option would be surgery.

## 2023-11-22 NOTE — Telephone Encounter (Signed)
 Called and read the notes from the AVS Amy included to the pt, she verbalized understanding.

## 2023-11-23 ENCOUNTER — Other Ambulatory Visit: Payer: Self-pay | Admitting: *Deleted

## 2023-11-23 DIAGNOSIS — N904 Leukoplakia of vulva: Secondary | ICD-10-CM

## 2023-11-23 DIAGNOSIS — L292 Pruritus vulvae: Secondary | ICD-10-CM

## 2023-11-23 MED ORDER — CLOBETASOL PROPIONATE 0.05 % EX OINT: TOPICAL_OINTMENT | CUTANEOUS | 3 refills | Status: AC

## 2023-11-23 NOTE — Telephone Encounter (Signed)
 Last filled on 02/01/23 #30 g/ 3 refills, CPE was on 06/03/23

## 2023-11-30 ENCOUNTER — Telehealth: Payer: Self-pay | Admitting: Family Medicine

## 2023-11-30 NOTE — Telephone Encounter (Signed)
 Copied from CRM [id number]. Topic: General - Other >> Nov 30, 2023 10:44 AM Corin V wrote: Reason for CRM: Patient is wanting to know if she can get a form filled out to get a handicap sticker for her car due to her ongoing knee pain issues. Please call back [phone]

## 2023-12-01 ENCOUNTER — Other Ambulatory Visit: Payer: Self-pay | Admitting: Family Medicine

## 2023-12-02 ENCOUNTER — Telehealth: Payer: Self-pay | Admitting: *Deleted

## 2023-12-02 ENCOUNTER — Ambulatory Visit: Admitting: Cardiology

## 2023-12-02 NOTE — Telephone Encounter (Signed)
 See prev note, if PCP approved form filled out and placed in your inbox

## 2023-12-02 NOTE — Telephone Encounter (Signed)
 Done and in IN box

## 2023-12-02 NOTE — Telephone Encounter (Signed)
 Copied from CRM [id number]. Topic: Clinical - Medical Advice >> Dec 02, 2023  9:06 AM Dimple Francis wrote: Reason for CRM: patient calling to check about getting a handicap card for her car, please reach out as soon as possible.

## 2023-12-02 NOTE — Progress Notes (Deleted)
     Electrophysiology Clinic Note    Date:  12/02/2023  Patient ID:  [name], Doke 06-29-1951, MRN [id number] PCP:  Clemens Curt, MD  Cardiologist:  Sammy Crisp, MD Electrophysiologist: Boyce Byes, MD (though has not met)  ***refresh  Discussed the use of AI scribe software for clinical note transcription with the patient, who gave verbal consent to proceed.   Patient Profile    Chief Complaint: SVT follow-up  History of Present Illness: Ashley Rasmussen is a 73 y.o. female with PMH notable for SVT, breast Ca, HLD; seen today for Boyce Byes, MD for routine electrophysiology followup.   I last saw her 03/2023 for ongoing mgmt of her SVT, well managed on 25mg  toprol .   On follow-up today,    Since last being seen in our clinic the patient reports doing ***.  she denies chest pain, palpitations, dyspnea, PND, orthopnea, nausea, vomiting, dizziness, syncope, edema, weight gain, or early satiety.      Arrhythmia/Device History No specialty comments available.    Device Information: ***  AAD History: ***    ROS:  Please see the history of present illness. All other systems are reviewed and otherwise negative.    Physical Exam    VS:  There were no vitals taken for this visit. BMI: There is no height or weight on file to calculate BMI.  Wt Readings from Last 3 Encounters:  11/18/23 191 lb 6.4 oz (86.8 kg)  11/04/23 190 lb (86.2 kg)  10/27/23 190 lb 8 oz (86.4 kg)     GEN- The patient is well appearing, alert and oriented x 3 today.   Lungs- Clear to ausculation bilaterally, normal work of breathing.  Heart- {Blank single:19197::"Regular","Irregularly irregular"} rate and rhythm, no murmurs, rubs or gallops Extremities- {EDEMA LEVEL:28147::"No"} peripheral edema, warm, dry Skin-  *** device pocket well-healed, no tethering   Device interrogation done today and reviewed by myself:  Battery *** Lead thresholds, impedence, sensing stable  *** *** episodes *** changes made today   Studies Reviewed   Previous EP, cardiology notes.    EKG {ACTION; IS/IS MWN:[id number]} ordered. Personal review of EKG from {Blank single:19197::"today","***"} shows:  ***             Assessment and Plan     #) ***   #) ***   {Are you ordering a CV Procedure (e.g. stress test, cath, DCCV, TEE, etc)?   Press F2        :[id number]}   Current medicines are reviewed at length with the patient today.   The patient {ACTIONS; HAS/DOES NOT HAVE:19233} concerns regarding her medicines.  The following changes were made today:  {NONE DEFAULTED:18576}  Labs/ tests ordered today include: *** No orders of the defined types were placed in this encounter.    Disposition: Follow up with {EPMDS:28135} or EP APP {EPFOLLOW UP:28173}   Signed, Adaline Holly, NP  12/02/23  8:05 AM  Electrophysiology [facility]

## 2023-12-02 NOTE — Telephone Encounter (Signed)
Pt notified form ready for pick up

## 2023-12-06 ENCOUNTER — Telehealth: Payer: Self-pay | Admitting: Internal Medicine

## 2023-12-06 NOTE — Telephone Encounter (Signed)
 Called patient - advised of concurrent use of Tylenol  and NSAids - alternate nsaid with tylenol , advised ICE and Heat for knee pain - patient verbalized understanding and agreement with plan

## 2023-12-06 NOTE — Telephone Encounter (Signed)
 Pt c/o medication issue:  1. Name of Medication:   Ibuprofen, Aleve, Naproxen  2. How are you currently taking this medication (dosage and times per day)?   3. Are you having a reaction (difficulty breathing--STAT)?   4. What is your medication issue?   Patient stated she has a bad knee and has been taking Tylenol  but wants to know which of these medications she can take from inflammation.

## 2024-03-06 ENCOUNTER — Ambulatory Visit (INDEPENDENT_AMBULATORY_CARE_PROVIDER_SITE_OTHER): Payer: Medicare Other

## 2024-03-06 VITALS — BP 140/86 | Ht 66.0 in | Wt 186.0 lb

## 2024-03-06 DIAGNOSIS — Z0000 Encounter for general adult medical examination without abnormal findings: Secondary | ICD-10-CM

## 2024-03-06 NOTE — Patient Instructions (Signed)
 Ashley Rasmussen , Thank you for taking time out of your busy schedule to complete your Annual Wellness Visit with me. I enjoyed our conversation and look forward to speaking with you again next year. I, as well as your care team,  appreciate your ongoing commitment to your health goals. Please review the following plan we discussed and let me know if I can assist you in the future. Your Game plan/ To Do List    Referrals: If you haven't heard from the office you've been referred to, please reach out to them at the phone provided.   Follow up Visits: We will see or speak with you next year for your Next Medicare AWV with our clinical staff Have you seen your provider in the last 6 months (3 months if uncontrolled diabetes)? No  Clinician Recommendations:  Aim for 30 minutes of exercise or brisk walking, 6-8 glasses of water, and 5 servings of fruits and vegetables each day.       This is a list of the screenings recommended for you:  Health Maintenance  Topic Date Due   Flu Shot  02/25/2024   COVID-19 Vaccine (4 - 2024-25 season) 06/18/2024*   Zoster (Shingles) Vaccine (1 of 2) 01/25/2025*   Mammogram  11/10/2024   Medicare Annual Wellness Visit  03/06/2025   DTaP/Tdap/Td vaccine (3 - Td or Tdap) 11/25/2026   Colon Cancer Screening  04/11/2028   Pneumococcal Vaccine for age over 17  Completed   DEXA scan (bone density measurement)  Completed   Hepatitis C Screening  Completed   Hepatitis B Vaccine  Aged Out   HPV Vaccine  Aged Out   Meningitis B Vaccine  Aged Out  *Topic was postponed. The date shown is not the original due date.    Advanced directives: (Declined) Advance directive discussed with you today. Even though you declined this today, please call our office should you change your mind, and we can give you the proper paperwork for you to fill out. Advance Care Planning is important because it:  [x]  Makes sure you receive the medical care that is consistent with your values, goals,  and preferences  [x]  It provides guidance to your family and loved ones and reduces their decisional burden about whether or not they are making the right decisions based on your wishes.  Follow the link provided in your after visit summary or read over the paperwork we have mailed to you to help you started getting your Advance Directives in place. If you need assistance in completing these, please reach out to us  so that we can help you!  See attachments for Preventive Care and Fall Prevention Tips.

## 2024-03-06 NOTE — Progress Notes (Signed)
 Because this visit was a virtual/telehealth visit,  certain criteria was not obtained, such a blood pressure, CBG if applicable, and timed get up and go. Any medications not marked as taking were not mentioned during the medication reconciliation part of the visit. Any vitals not documented were not able to be obtained due to this being a telehealth visit or patient was unable to self-report a recent blood pressure reading due to a lack of equipment at home via telehealth. Vitals that have been documented are verbally provided by the patient.   This visit was performed by a medical professional under my direct supervision. I was immediately available for consultation/collaboration. I have reviewed and agree with the Annual Wellness Visit documentation.  Subjective:   Ashley Rasmussen is a 73 y.o. who presents for a Medicare Wellness preventive visit.  As a reminder, Annual Wellness Visits don't include a physical exam, and some assessments may be limited, especially if this visit is performed virtually. We may recommend an in-person follow-up visit with your provider if needed.  Visit Complete: Virtual I connected with  Ashley Rasmussen on 03/06/24 by a audio enabled telemedicine application and verified that I am speaking with the correct person using two identifiers.  Patient Location: Home  Provider Location: Home Office  I discussed the limitations of evaluation and management by telemedicine. The patient expressed understanding and agreed to proceed.  Vital Signs: Because this visit was a virtual/telehealth visit, some criteria may be missing or patient reported. Any vitals not documented were not able to be obtained and vitals that have been documented are patient reported.  VideoDeclined- This patient declined Librarian, academic. Therefore the visit was completed with audio only.  Persons Participating in Visit: Patient.  AWV Questionnaire: No: Patient Medicare  AWV questionnaire was not completed prior to this visit.  Cardiac Risk Factors include: advanced age (>33men, >50 women);dyslipidemia;obesity (BMI >30kg/m2)     Objective:    Today's Vitals   03/06/24 0942  BP: (!) 140/86  Weight: 186 lb (84.4 kg)  Height: 5' 6 (1.676 m)   Body mass index is 30.02 kg/m.     03/06/2024    9:41 AM 03/02/2023    9:13 AM 12/03/2021   10:07 AM 10/30/2020    8:32 AM 10/16/2020   10:06 AM 04/11/2018    9:38 AM 12/09/2017   12:37 PM  Advanced Directives  Does Patient Have a Medical Advance Directive? No No No No No No  No   Would patient like information on creating a medical advance directive? No - Patient declined No - Patient declined  No - Patient declined  No - Patient declined  Yes (MAU/Ambulatory/Procedural Areas - Information given)      Data saved with a previous flowsheet row definition    Current Medications (verified) Outpatient Encounter Medications as of 03/06/2024  Medication Sig   acetaminophen  (TYLENOL ) 500 MG tablet Take 500 mg by mouth every 6 (six) hours as needed.   cholecalciferol (VITAMIN D3) 25 MCG (1000 UNIT) tablet Take 4,000 Units by mouth daily.   clobetasol  ointment (TEMOVATE ) 0.05 % Apply to affected area every night for 4 weeks, then every other day for 4 weeks and then twice a week for 4 weeks or until resolution.   cyanocobalamin  (VITAMIN B12) 1000 MCG tablet Take 1,000 mcg by mouth daily.   metoprolol  succinate (TOPROL -XL) 50 MG 24 hr tablet Take 1 tablet (50 mg total) by mouth daily.   metoprolol  tartrate (LOPRESSOR ) 25  MG tablet Take 1 tablet by mouth twice daily as needed for Heart Rate greater than 120.   Multiple Vitamin (MULTIVITAMIN) tablet Take 3 tablets by mouth daily.   omeprazole  (PRILOSEC) 20 MG capsule TAKE 1 CAPSULE DAILY AS NEEDED   ezetimibe  (ZETIA ) 10 MG tablet Take 1 tablet (10 mg total) by mouth daily. (Patient not taking: Reported on 03/06/2024)   No facility-administered encounter medications on file  as of 03/06/2024.    Allergies (verified) Crestor  [rosuvastatin ], Alendronate sodium, Nsaids, Pravachol, Raloxifene, and Simvastatin   History: Past Medical History:  Diagnosis Date   Allergic rhinitis, cause unspecified    Breast cancer (HCC) 1992   Complication of anesthesia    Family history of breast cancer    HLD (hyperlipidemia)    Osteopenia    Osteoporosis, unspecified    Personal history of chemotherapy    Personal history of malignant neoplasm of breast    needs yearly mammogram and MRI every 2   Personal history of radiation therapy    PONV (postoperative nausea and vomiting)    Thoracic or lumbosacral neuritis or radiculitis, unspecified    Past Surgical History:  Procedure Laterality Date   BREAST BIOPSY Left 03/2015   BREAST BIOPSY Right 10/09/2020   Breast cancer excision     BREAST LUMPECTOMY Right    COLONOSCOPY WITH PROPOFOL  N/A 04/11/2018   Procedure: COLONOSCOPY WITH PROPOFOL ;  Surgeon: Therisa Bi, MD;  Location: [facility];  Service: Gastroenterology;  Laterality: N/A;   LATISSIMUS FLAP TO BREAST     due to radiation burn   MASTECTOMY Right 10/30/2020   TOTAL MASTECTOMY Right 10/30/2020   Procedure: RIGHT MASTECTOMY;  Surgeon: Curvin Deward MOULD, MD;  Location: [facility];  Service: General;  Laterality: Right;   Family History  Problem Relation Age of Onset   Other Mother        B-12 deficiency   Breast cancer Mother 6   Melanoma Mother        dx 47s (foot) and 64s (arm)   Myelodysplastic syndrome Mother        dx mid-70s   Osteoporosis Mother    Alcoholism Father    Other Sister        B-12 deficiency   Breast cancer Sister 16       inconclusive genetic testing   Osteoporosis Sister    Lung cancer Maternal Uncle        radiation exposure    Cancer Paternal Aunt        unknown type, dx >50   Cancer Paternal Aunt        unknown type, dx >50   Breast cancer Cousin 68       (maternal first cousin)   Breast cancer Cousin         dx 69s or 28s (maternal first cousin)   Cancer Nephew 23       Ewing's sarcoma   Heart disease Neg Hx    Social History   Socioeconomic History   Marital status: Married    Spouse name: Not on file   Number of children: 4   Years of education: Not on file   Highest education level: Not on file  Occupational History   Not on file  Tobacco Use   Smoking status: Former    Current packs/day: 0.00    Average packs/day: 0.1 packs/day for 2.0 years (0.2 ttl pk-yrs)    Types: Cigarettes    Start date: 07/28/1987  Quit date: 07/27/1989    Years since quitting: 34.6   Smokeless tobacco: Never   Tobacco comments:    quit over 70yrs ago  Vaping Use   Vaping status: Never Used  Substance and Sexual Activity   Alcohol use: No    Alcohol/week: 0.0 standard drinks of alcohol   Drug use: No   Sexual activity: Never  Other Topics Concern   Not on file  Social History Narrative   Married      4 children         Social Drivers of Health   Financial Resource Strain: Low Risk  (03/06/2024)   Overall Financial Resource Strain (CARDIA)    Difficulty of Paying Living Expenses: Not hard at all  Food Insecurity: No Food Insecurity (03/06/2024)   Hunger Vital Sign    Worried About Running Out of Food in the Last Year: Never true    Ran Out of Food in the Last Year: Never true  Transportation Needs: No Transportation Needs (03/06/2024)   PRAPARE - Administrator, Civil Service (Medical): No    Lack of Transportation (Non-Medical): No  Physical Activity: Sufficiently Active (03/06/2024)   Exercise Vital Sign    Days of Exercise per Week: 4 days    Minutes of Exercise per Session: 40 min  Stress: No Stress Concern Present (03/06/2024)   [employer] of Occupational Health - Occupational Stress Questionnaire    Feeling of Stress: Not at all  Social Connections: Moderately Isolated (03/06/2024)   Social Connection and Isolation Panel    Frequency of Communication with  Friends and Family: More than three times a week    Frequency of Social Gatherings with Friends and Family: More than three times a week    Attends Religious Services: Never    Database administrator or Organizations: No    Attends Engineer, structural: Never    Marital Status: Married    Tobacco Counseling Counseling given: Not Answered Tobacco comments: quit over 34yrs ago    Clinical Intake:  Pre-visit preparation completed: Yes  Pain : No/denies pain     BMI - recorded: 30.02 Nutritional Status: BMI > 30  Obese Nutritional Risks: None Diabetes: No  Lab Results  Component Value Date   HGBA1C 5.9 05/27/2023   HGBA1C 5.9 04/24/2022   HGBA1C 5.9 09/25/2020     How often do you need to have someone help you when you read instructions, pamphlets, or other written materials from your doctor or pharmacy?: 1 - Never What is the last grade level you completed in school?: 11th grade  Interpreter Needed?: No  Information entered by :: Jobanny Mavis,CMA   Activities of Daily Living     03/06/2024    9:45 AM  In your present state of health, do you have any difficulty performing the following activities:  Hearing? 0  Vision? 0  Difficulty concentrating or making decisions? 0  Walking or climbing stairs? 0  Dressing or bathing? 0  Doing errands, shopping? 0  Preparing Food and eating ? N  Using the Toilet? N  In the past six months, have you accidently leaked urine? N  Do you have problems with loss of bowel control? Y  Managing your Medications? N  Managing your Finances? N  Housekeeping or managing your Housekeeping? N    Patient Care Team: Tower, Laine LABOR, MD as PCP - General End, Lonni, MD as PCP - Cardiology (Cardiology) Cindie Ole DASEN, MD as PCP -  Electrophysiology (Cardiology) Glean Stephane BROCKS, RN (Inactive) as Oncology Nurse Navigator Tyree Nanetta SAILOR, RN as Oncology Nurse Navigator Curvin Deward MOULD, MD as Consulting Physician (General  Surgery) Lanny Callander, MD as Consulting Physician (Hematology) Dewey Rush, MD as Consulting Physician (Radiation Oncology)  I have updated your Care Teams any recent Medical Services you may have received from other providers in the past year.     Assessment:   This is a routine wellness examination for Ashley Rasmussen.  Hearing/Vision screen Hearing Screening - Comments:: No difficulties  Vision Screening - Comments:: No difficulties    Goals Addressed             This Visit's Progress    Increase physical activity   On track    Loose weight       Depression Screen     03/06/2024    9:46 AM 10/27/2023   12:34 PM 06/03/2023    9:58 AM 03/02/2023    9:13 AM 12/03/2021   10:15 AM 09/27/2020   11:34 AM 06/27/2019   11:44 AM  PHQ 2/9 Scores  PHQ - 2 Score 0 0 0 0 0 0 0  PHQ- 9 Score 0 0 0        Fall Risk     03/06/2024    9:44 AM 06/03/2023    9:58 AM 03/02/2023    9:15 AM 12/03/2021   10:07 AM 09/27/2020   11:35 AM  Fall Risk   Falls in the past year? 0 0 0 0 0  Number falls in past yr: 0 0 0 0 0  Injury with Fall? 0 0 0 0   Risk for fall due to : No Fall Risks No Fall Risks No Fall Risks    Follow up Falls evaluation completed Falls evaluation completed Falls prevention discussed;Falls evaluation completed Falls evaluation completed;Education provided;Falls prevention discussed       Data saved with a previous flowsheet row definition    MEDICARE RISK AT HOME:  Medicare Risk at Home Any stairs in or around the home?: Yes If so, are there any without handrails?: No Home free of loose throw rugs in walkways, pet beds, electrical cords, etc?: Yes Adequate lighting in your home to reduce risk of falls?: Yes Life alert?: No Use of a cane, walker or w/c?: No Grab bars in the bathroom?: Yes Shower chair or bench in shower?: Yes Elevated toilet seat or a handicapped toilet?: Yes  TIMED UP AND GO:  Was the test performed?  No  Cognitive Function: 6CIT completed    12/09/2017    12:38 PM 09/26/2015    1:39 PM  MMSE - Mini Mental State Exam  Orientation to time 5 5   Orientation to Place 5 5   Registration 3 3   Attention/ Calculation 0 5   Recall 2 3   Language- name 2 objects 0 0   Language- repeat 1 1  Language- follow 3 step command 3 3   Language- read & follow direction 0 1   Write a sentence 0 0   Copy design 0 0   Total score 19 26      Data saved with a previous flowsheet row definition        03/06/2024    9:47 AM 03/02/2023    9:16 AM 12/03/2021   10:04 AM  6CIT Screen  What Year? 0 points 0 points 0 points  What month? 0 points 0 points 0 points  What time? 0 points 0  points 0 points  Count back from 20 0 points 0 points 2 points  Months in reverse 0 points 0 points 4 points  Repeat phrase 0 points 0 points 4 points  Total Score 0 points 0 points 10 points    Immunizations Immunization History  Administered Date(s) Administered   Fluad Quad(high Dose 65+) 05/31/2020, 04/30/2022   Fluad Trivalent(High Dose 65+) 06/03/2023   Influenza Split 06/21/2012   Influenza Whole 07/13/2002   Influenza,inj,Quad PF,6+ Mos 05/23/2013, 09/26/2015, 05/22/2016, 06/03/2017, 05/12/2018, 03/21/2019   PFIZER(Purple Top)SARS-COV-2 Vaccination 11/06/2019, 12/05/2019, 06/12/2020   Pneumococcal Conjugate-13 09/26/2015   Pneumococcal Polysaccharide-23 11/24/2016   Td 05/27/2005   Tdap 11/24/2016    Screening Tests Health Maintenance  Topic Date Due   INFLUENZA VACCINE  02/25/2024   COVID-19 Vaccine (4 - 2024-25 season) 06/18/2024 (Originally 03/28/2023)   Zoster Vaccines- Shingrix (1 of 2) 01/25/2025 (Originally [birth year]/1969)   MAMMOGRAM  11/10/2024   Medicare Annual Wellness (AWV)  03/06/2025   DTaP/Tdap/Td (3 - Td or Tdap) 11/25/2026   Colonoscopy  04/11/2028   Pneumococcal Vaccine: 50+ Years  Completed   DEXA SCAN  Completed   Hepatitis C Screening  Completed   Hepatitis B Vaccines  Aged Out   HPV VACCINES  Aged Out   Meningococcal B Vaccine  Aged  Out    Health Maintenance  Health Maintenance Due  Topic Date Due   INFLUENZA VACCINE  02/25/2024   Health Maintenance Items Addressed:patient did not get the vaccination yet  Additional Screening:  Vision Screening: Recommended annual ophthalmology exams for early detection of glaucoma and other disorders of the eye. Would you like a referral to an eye doctor? No    Dental Screening: Recommended annual dental exams for proper oral hygiene  Community Resource Referral / Chronic Care Management: CRR required this visit?  No   CCM required this visit?  No   Plan:    I have personally reviewed and noted the following in the patient's chart:   Medical and social history Use of alcohol, tobacco or illicit drugs  Current medications and supplements including opioid prescriptions. Patient is not currently taking opioid prescriptions. Functional ability and status Nutritional status Physical activity Advanced directives List of other physicians Hospitalizations, surgeries, and ER visits in previous 12 months Vitals Screenings to include cognitive, depression, and falls Referrals and appointments  In addition, I have reviewed and discussed with patient certain preventive protocols, quality metrics, and best practice recommendations. A written personalized care plan for preventive services as well as general preventive health recommendations were provided to patient.   Ashley Rasmussen Right, [place]   03/06/2024   After Visit Summary: (MyChart) Due to this being a telephonic visit, the after visit summary with patients personalized plan was offered to patient via MyChart   Notes: Nothing significant to report at this time.

## 2024-03-08 DIAGNOSIS — H90A32 Mixed conductive and sensorineural hearing loss, unilateral, left ear with restricted hearing on the contralateral side: Secondary | ICD-10-CM | POA: Diagnosis not present

## 2024-03-08 DIAGNOSIS — H7192 Unspecified cholesteatoma, left ear: Secondary | ICD-10-CM | POA: Diagnosis not present

## 2024-03-08 DIAGNOSIS — H90A21 Sensorineural hearing loss, unilateral, right ear, with restricted hearing on the contralateral side: Secondary | ICD-10-CM | POA: Diagnosis not present

## 2024-04-28 ENCOUNTER — Ambulatory Visit: Payer: Self-pay

## 2024-04-28 NOTE — Telephone Encounter (Signed)
Appt scheduled Monday.

## 2024-04-28 NOTE — Telephone Encounter (Signed)
 FYI Only or Action Required?: FYI only for provider.  Patient was last seen in primary care on 10/27/2023 by Randeen Laine LABOR, MD.  Called Nurse Triage reporting Leg Pain and Knee Pain.  Symptoms began several weeks ago.  Interventions attempted: OTC medications: topical voltaran.  Symptoms are: unchanged.  Triage Disposition: See PCP When Office is Open (Within 3 Days)  Patient/caregiver understands and will follow disposition?: Yes         Copied from CRM #[id number]. Topic: Clinical - Red Word Triage >> Apr 28, 2024  9:39 AM [place] J wrote: [facility] that prompted transfer to Nurse Triage: Patient's left leg & right knee is very sore. Reason for Disposition  [1] MODERATE pain (e.g., interferes with normal activities, limping) AND [2] present > 3 days    Pt has chronic R knee pain and has been compensating with L leg. Pt now experiencing new L knee pain and swelling, mainly with movement/walking.  Answer Assessment - Initial Assessment Questions 1. LOCATION and RADIATION: Where is the pain located?      Chronic R knee pain, now new L knee is hurting d/t compensation I feel like there's fluid in there 2. QUALITY: What does the pain feel like?  (e.g., sharp, dull, aching, burning)     *No Answer* 3. SEVERITY: How bad is the pain? What does it keep you from doing?   (Scale 1-10; or mild, moderate, severe)     5/10 with movement Voltaran/ibuprofen with some relief 4. ONSET: When did the pain start? Does it come and go, or is it there all the time?     1.5 weeks 5. RECURRENT: Have you had this pain before? If Yes, ask: When, and what happened then?     denies 6. SETTING: Has there been any recent work, exercise or other activity that involved that part of the body?      denies 7. AGGRAVATING FACTORS: What makes the knee pain worse? (e.g., walking, climbing stairs, running)     walking 8. ASSOCIATED SYMPTOMS: Is there any swelling or redness of the knee?      Endorses swelling I might have a little fluid in there or something 9. OTHER SYMPTOMS: Do you have any other symptoms? (e.g., calf pain, chest pain, difficulty breathing, fever)     denies 10. PREGNANCY: Is there any chance you are pregnant? When was your last menstrual period?       N/a  Answer Assessment - Initial Assessment Questions 1. ONSET: When did the pain start?      1.5 weeks ago 2. LOCATION: Where is the pain located?       3. PAIN: How bad is the pain?    (Scale 1-10; or mild, moderate, severe)     *No Answer* 4. WORK OR EXERCISE: Has there been any recent work or exercise that involved this part of the body?      *No Answer* 5. CAUSE: What do you think is causing the leg pain?     *No Answer* 6. OTHER SYMPTOMS: Do you have any other symptoms? (e.g., chest pain, back pain, breathing difficulty, swelling, rash, fever, numbness, weakness)     *No Answer* 7. PREGNANCY: Is there any chance you are pregnant? When was your last menstrual period?     *No Answer*  Protocols used: Leg Pain-A-AH, Knee Pain-A-AH

## 2024-04-28 NOTE — Telephone Encounter (Signed)
 Will see patient then Agree with ER and UC precautions

## 2024-05-01 ENCOUNTER — Ambulatory Visit: Admitting: Family Medicine

## 2024-05-01 ENCOUNTER — Ambulatory Visit (INDEPENDENT_AMBULATORY_CARE_PROVIDER_SITE_OTHER)
Admission: RE | Admit: 2024-05-01 | Discharge: 2024-05-01 | Disposition: A | Source: Ambulatory Visit | Attending: Family Medicine | Admitting: Family Medicine

## 2024-05-01 ENCOUNTER — Encounter: Payer: Self-pay | Admitting: Family Medicine

## 2024-05-01 ENCOUNTER — Ambulatory Visit: Payer: Self-pay | Admitting: Family Medicine

## 2024-05-01 VITALS — BP 130/88 | HR 75 | Temp 98.7°F | Ht 66.0 in | Wt 191.2 lb

## 2024-05-01 DIAGNOSIS — Z23 Encounter for immunization: Secondary | ICD-10-CM

## 2024-05-01 DIAGNOSIS — G8929 Other chronic pain: Secondary | ICD-10-CM

## 2024-05-01 DIAGNOSIS — M25562 Pain in left knee: Secondary | ICD-10-CM | POA: Insufficient documentation

## 2024-05-01 DIAGNOSIS — M170 Bilateral primary osteoarthritis of knee: Secondary | ICD-10-CM | POA: Diagnosis not present

## 2024-05-01 NOTE — Progress Notes (Signed)
 Subjective:    Patient ID: Ashley Rasmussen, female    DOB: 1950/11/06, 73 y.o.   MRN: [id number]  HPI  Wt Readings from Last 3 Encounters:  05/01/24 191 lb 4 oz (86.8 kg)  03/06/24 186 lb (84.4 kg)  11/18/23 191 lb 6.4 oz (86.8 kg)   30.87 kg/m  Vitals:   05/01/24 1149  BP: 130/88  Pulse: 75  Temp: 98.7 F (37.1 C)  SpO2: 96%   Pt presents with c/o Left knee pain (worse than right)   Has over used her left knee  Fell on her deck and injured it   (3-4 mo ago, twisted knee / wearing flip flops)  Was able to get up and was able to put weight on it  Hurts to turn a certain way Pain is both sharp and dull  Mostly lateral and anterior  Hurts worse to straighten (than bend)  Can radiate down her shin when walking    No pain when resting or at night  A little swelling  Not tender to the touch  Tight feeling   Uses a seated pedaler for exercise - does not hurt her knees Likes it    History of known OA right knee in past  It is doing better overall   Saw Dr Onesimo -orthopedics in April and had injection  Per pt injection did not help/made it worse   Over the counter Voltaren  gel Occational oral ibuprofen Ice did not help   Knee film today  DG Knee 4 Views W/Patella Left Result Date: 05/01/2024 CLINICAL DATA:  Acute on chronic left knee pain. Recent fall. Swelling. EXAM: LEFT KNEE - COMPLETE 4+ VIEW COMPARISON:  None Available. FINDINGS: No joint effusion or fracture. Peaking of the tibial spines. Slight lateral subluxation of the proximal tibia with respect to the distal femur. Lateral compartment osteophytosis in the left knee. Medial joint space narrowing in the right knee. IMPRESSION: 1. No acute findings. 2. Bilateral osteoarthritis. Electronically Signed   By: Newell Eke M.D.   On: 05/01/2024 13:32     Patient Active Problem List   Diagnosis Date Noted   Left knee pain 05/01/2024   Arthritis of right knee 10/27/2023   Vitamin B12 deficiency 06/03/2023    Elevated blood pressure reading 06/03/2023   Vitamin D  deficiency 06/03/2023   Current use of proton pump inhibitor 05/23/2023   SVT (supraventricular tachycardia) 02/23/2023   Near syncope 01/27/2023   Dysuria 09/18/2021   Genetic testing 10/24/2020   Family history of breast cancer    Family history of lung cancer    Ductal carcinoma in situ (DCIS) of right breast 10/10/2020   Lichen sclerosus 03/05/2020   Medicare annual wellness visit, subsequent 06/27/2019   Estrogen deficiency 01/26/2018   Elevated glucose level 12/08/2017   Family history of melanoma 11/24/2016   Palpitations    Tachycardia    Hearing loss in left ear 10/02/2015   GERD (gastroesophageal reflux disease) 05/08/2015   Obesity 12/13/2012   Encounter for routine gynecological examination 08/16/2012   Colon cancer screening 08/16/2012   Routine general medical examination at a health care facility 08/08/2012   Uterine prolapse 07/06/2012   BACK PAIN, LUMBAR, WITH RADICULOPATHY 08/15/2010   Hyperlipidemia 04/13/2008   Osteoporosis 04/06/2008   Allergic rhinitis 04/15/2007   BREAST CANCER, HX OF 04/15/2007   Past Medical History:  Diagnosis Date   Allergic rhinitis, cause unspecified    Breast cancer (HCC) 1992   Complication of anesthesia  Family history of breast cancer    HLD (hyperlipidemia)    Osteopenia    Osteoporosis, unspecified    Personal history of chemotherapy    Personal history of malignant neoplasm of breast    needs yearly mammogram and MRI every 2   Personal history of radiation therapy    PONV (postoperative nausea and vomiting)    Thoracic or lumbosacral neuritis or radiculitis, unspecified    Past Surgical History:  Procedure Laterality Date   BREAST BIOPSY Left 03/2015   BREAST BIOPSY Right 10/09/2020   Breast cancer excision     BREAST LUMPECTOMY Right    COLONOSCOPY WITH PROPOFOL  N/A 04/11/2018   Procedure: COLONOSCOPY WITH PROPOFOL ;  Surgeon: Therisa Bi, MD;  Location:  [facility] ENDOSCOPY;  Service: Gastroenterology;  Laterality: N/A;   LATISSIMUS FLAP TO BREAST     due to radiation burn   MASTECTOMY Right 10/30/2020   TOTAL MASTECTOMY Right 10/30/2020   Procedure: RIGHT MASTECTOMY;  Surgeon: Curvin Deward MOULD, MD;  Location:  SURGERY CENTER;  Service: General;  Laterality: Right;   Social History   Tobacco Use   Smoking status: Former    Current packs/day: 0.00    Average packs/day: 0.1 packs/day for 2.0 years (0.2 ttl pk-yrs)    Types: Cigarettes    Start date: 07/28/1987    Quit date: 07/27/1989    Years since quitting: 34.7   Smokeless tobacco: Never   Tobacco comments:    quit over 62yrs ago  Vaping Use   Vaping status: Never Used  Substance Use Topics   Alcohol use: No    Alcohol/week: 0.0 standard drinks of alcohol   Drug use: No   Family History  Problem Relation Age of Onset   Other Mother        B-12 deficiency   Breast cancer Mother 61   Melanoma Mother        dx 36s (foot) and 92s (arm)   Myelodysplastic syndrome Mother        dx mid-70s   Osteoporosis Mother    Alcoholism Father    Other Sister        B-12 deficiency   Breast cancer Sister 62       inconclusive genetic testing   Osteoporosis Sister    Lung cancer Maternal Uncle        radiation exposure    Cancer Paternal Aunt        unknown type, dx >50   Cancer Paternal Aunt        unknown type, dx >50   Breast cancer Cousin 49       (maternal first cousin)   Breast cancer Cousin        dx 59s or 67s (maternal first cousin)   Cancer Nephew 23       Ewing's sarcoma   Heart disease Neg Hx    Allergies  Allergen Reactions   Crestor  [Rosuvastatin ]     Myalgia  Even once weekly dose   Alendronate Sodium Other (See Comments)    REACTION: GI side effects   Nsaids Other (See Comments)    GI upset with oral nsaids   Pravachol Other (See Comments)    myalgias   Raloxifene Other (See Comments)    REACTION: breast tenderness and leg pain   Simvastatin Other  (See Comments)    REACTION: myalgias   Current Outpatient Medications on File Prior to Visit  Medication Sig Dispense Refill   acetaminophen  (TYLENOL ) 500 MG tablet Take  500 mg by mouth every 6 (six) hours as needed.     cholecalciferol (VITAMIN D3) 25 MCG (1000 UNIT) tablet Take 4,000 Units by mouth daily.     clobetasol  ointment (TEMOVATE ) 0.05 % Apply to affected area every night for 4 weeks, then every other day for 4 weeks and then twice a week for 4 weeks or until resolution. 30 g 3   cyanocobalamin  (VITAMIN B12) 1000 MCG tablet Take 1,000 mcg by mouth daily.     ezetimibe  (ZETIA ) 10 MG tablet Take 1 tablet (10 mg total) by mouth daily. 90 tablet 3   ibuprofen (ADVIL) 400 MG tablet Take 400 mg by mouth daily as needed.     metoprolol  succinate (TOPROL -XL) 50 MG 24 hr tablet Take 1 tablet (50 mg total) by mouth daily. (Patient taking differently: Take 50 mg by mouth every other day.) 90 tablet 3   Multiple Vitamin (MULTIVITAMIN) tablet Take 3 tablets by mouth daily.     omeprazole  (PRILOSEC) 20 MG capsule TAKE 1 CAPSULE DAILY AS NEEDED 90 capsule 1   No current facility-administered medications on file prior to visit.    Review of Systems  Constitutional:  Negative for fatigue and fever.  Musculoskeletal:  Positive for arthralgias.       Left knee pain   Neurological:  Negative for weakness and numbness.       Objective:   Physical Exam Constitutional:      General: She is not in acute distress.    Appearance: Normal appearance. She is obese. She is not ill-appearing or diaphoretic.  Cardiovascular:     Rate and Rhythm: Normal rate.  Pulmonary:     Effort: Pulmonary effort is normal. No respiratory distress.  Musculoskeletal:     Comments: Knee left  No obvious effusion  No warmth to the touch  Mild crepitus ROM:  Flex just past 90 degrees some discomfort  Ext -discomfort with full extension  [place]- mild discomfort  Bounce test - significant pain    Stability: Anterior drawer-nl Lachman exam -normal   Tenderness lateral joint line/ patellar tendon  Gait favors RLE today but can bear weight on left      Skin:    Findings: No erythema or rash.  Neurological:     Mental Status: She is alert.     Sensory: No sensory deficit.     Motor: No weakness.  Psychiatric:        Mood and Affect: Mood normal.           Assessment & Plan:   Problem List Items Addressed This Visit       Other   Left knee pain - Primary   Pain in left knee- acute on chronic after injury (twist/fall 3-4 mo ago)  Most pain is lateral and anterior  No obv effusion but difficult to tell  Not improving with ice /voltaren  gel   Of note: history of OA in other knee (did not tolerate injection)  Today Xray knee  Encouraged use of cold compress (try again) Compression if helpful Keep pedaling for exercise         Relevant Orders   DG Knee 4 Views W/Patella Left   Other Visit Diagnoses       Need for influenza vaccination       Relevant Orders   Flu vaccine HIGH DOSE PF(Fluzone Trivalent) (Completed)

## 2024-05-01 NOTE — Patient Instructions (Signed)
 Xray of knee now  We will reach out with results and plan   Try ice/cold compress one more time  It is ok to use the brace  Also voltaren  gel   The pedaling is fine if it does not hurt your knee

## 2024-05-01 NOTE — Assessment & Plan Note (Addendum)
 Pain in left knee- acute on chronic after injury (twist/fall 3-4 mo ago)  Most pain is lateral and anterior  No obv effusion but difficult to tell  Not improving with ice /voltaren  gel   Of note: history of OA in other knee (did not tolerate injection)  Today Xray knee notes slightly lateral subluxation of prox tibia and lateral comp osteophytosis and OA   Interested in ortho referral Encouraged use of cold compress (try again) Compression if helpful Keep pedaling for exercise

## 2024-05-02 NOTE — Addendum Note (Signed)
 Addended by: RANDEEN HARDY A on: 05/02/2024 01:50 PM   Modules accepted: Orders

## 2024-05-02 NOTE — Telephone Encounter (Signed)
 Copied from CRM [id number]. Topic: Referral - Question >> May 02, 2024  8:44 AM Ashley Rasmussen wrote: Reason for CRM: Patient states she would like the orthopedic referral to be somewhere in [place], please. Was told to call back and let us  know.  Patient callback is [phone]

## 2024-05-15 ENCOUNTER — Ambulatory Visit: Attending: Medical | Admitting: Medical

## 2024-05-15 ENCOUNTER — Encounter: Payer: Self-pay | Admitting: Medical

## 2024-05-15 VITALS — BP 160/94 | HR 85 | Ht 66.0 in | Wt 190.4 lb

## 2024-05-15 DIAGNOSIS — E7849 Other hyperlipidemia: Secondary | ICD-10-CM | POA: Insufficient documentation

## 2024-05-15 DIAGNOSIS — I10 Essential (primary) hypertension: Secondary | ICD-10-CM | POA: Diagnosis not present

## 2024-05-15 DIAGNOSIS — I471 Supraventricular tachycardia, unspecified: Secondary | ICD-10-CM | POA: Insufficient documentation

## 2024-05-15 DIAGNOSIS — Z79899 Other long term (current) drug therapy: Secondary | ICD-10-CM | POA: Insufficient documentation

## 2024-05-15 MED ORDER — REPATHA SURECLICK 140 MG/ML ~~LOC~~ SOAJ: 140.0000 mg | SUBCUTANEOUS | 3 refills | Status: DC

## 2024-05-15 NOTE — Patient Instructions (Signed)
 Medication Instructions:  Your physician recommends the following medication changes.  START TAKING: Repatha 140 mg: inject into the skin every 14 days   *If you need a refill on your cardiac medications before your next appointment, please call your pharmacy*  Lab Work: Your provider would like for you to return in 8 weeks to have the following labs drawn: Lipid.   Please go to [facility] [address] ([facility] [address], [place] [address] You do not need an appointment.  They are open from 8 am- 4:30 pm.  Lunch from 1:00 pm- 2:00 pm You will need to be fasting.    Testing/Procedures: No test ordered today   Follow-Up: At [facility], you and your health needs are our priority.  As part of our continuing mission to provide you with exceptional heart care, our providers are all part of one team.  This team includes your primary Cardiologist (physician) and Advanced Practice Providers or APPs (Physician Assistants and Nurse Practitioners) who all work together to provide you with the care you need, when you need it.  Your next appointment:   6 month(s)  Provider:   Lonni Hanson, MD or Cadence Franchester, PA-C

## 2024-05-15 NOTE — Progress Notes (Signed)
 Cardiology Office Note   Date:  05/15/2024  ID:  Lashia, Niese July 10, 1951, MRN [id number] PCP: Randeen Laine DELENA, MD  [facility] Providers Cardiologist:  Lonni Hanson, MD Electrophysiologist:  OLE ONEIDA HOLTS, MD    History of Present Illness Ashley Rasmussen is a 73 y.o. female with a history of symptomatic PSVT, right sided breast cancer status post lobectomy in 1992, status postmastectomy 2022, HTN, HLD, left-sided cholesteatoma who presents for follow-up of pSVT.   Lexiscan  Myoview  in 2018 showed no evidence of infarct or ischemia, EF greater than 65%, overall low risk.   She was evaluated as a new patient in July 2024 for tachypalpitations.  Heart monitor showed normal sinus rhythm with 62 episodes of SVT lasting up to 1 minute and 46 seconds.  The longest episode was felt to possibly represent A-fib as a tachyarrhythmia.  Patient triggered events corresponded to normal sinus rhythm, PACs, PVCs, PSVT.  Patient was referred to EP.  Echo showed EF of 60 to 65%, no wall motion abnormalities, grade 1 diastolic dysfunction.  She saw EP and continued to note palpitations with associated lightheadedness and dizziness.  She was taking Lopressor  1-2 times per week.  Arrhythmia was not felt to represent A-fib with recommendation for patient to start Toprol .  Patient saw EP 04/02/2023 reporting improvement of symptoms.  The patient was last seen 11/18/23 and was stable from a cardiac perspective.   Today, the patient reports she is overall doing well. She had one episode of tachycardia when she forgot to take her metoprolol . Once she took her pill her heart rate improved. Says she is unable to take statins due to muscle pain. Says weight has gone up. She is not able to do a lot of walking since knee is acting up.   Studies Reviewed EKG Interpretation Date/Time:  Monday May 15 2024 10:55:09 EDT Ventricular Rate:  85 PR Interval:  152 QRS Duration:  76 QT Interval:  372 QTC  Calculation: 442 R Axis:   37  Text Interpretation: Normal sinus rhythm Nonspecific ST and T wave abnormality When compared with ECG of 18-Nov-2023 10:23, Nonspecific T wave abnormality now evident in Lateral leads Confirmed by Franchester, Maxden Naji ([id number]) on 05/15/2024 11:03:24 AM    Echo 01/2023 1. Left ventricular ejection fraction, by estimation, is 60 to 65%. The  left ventricle has normal function. The left ventricle has no regional  wall motion abnormalities. Left ventricular diastolic parameters are  consistent with Grade I diastolic  dysfunction (impaired relaxation). The average left ventricular global  longitudinal strain is -24.8 %.   2. Right ventricular systolic function is normal. The right ventricular  size is normal.   3. The mitral valve is normal in structure. No evidence of mitral valve  regurgitation. No evidence of mitral stenosis.   4. The aortic valve is normal in structure. Aortic valve regurgitation is  not visualized. No aortic stenosis is present.   5. The inferior vena cava is normal in size with greater than 50%  respiratory variability, suggesting right atrial pressure of 3 mmHg.    Heart monitor 01/2023   The patient was monitored for 7 days, 16 hours.   The predominant rhythm was sinus with an average rate of 81 bpm (range 31-144 bpm in sinus).   There were rare PACs and PVCs.   62 supraventricular runs were observed, lasting up to 1 minute, 46 seconds with a maximum rate of 231 bpm.  The longest episode could represent atrial  fibrillation, as the tachyarrhythmia appears irregular.   No prolonged pause was observed.   Patient triggered events corresponded to sinus rhythm, PACs, PVCs, and PSVT.   Predominantly sinus rhythm with multiple episodes of SVT.  Longest episode lasted 1 minute, 46 seconds send: Due to irregularity in the available tracing, paroxysmal atrial fibrillation cannot be excluded.  Physical Exam VS:  BP (!) 160/94   Pulse 85   Ht 5' 6  (1.676 m)   Wt 190 lb 6.4 oz (86.4 kg)   SpO2 96%   BMI 30.73 kg/m        Wt Readings from Last 3 Encounters:  05/15/24 190 lb 6.4 oz (86.4 kg)  05/01/24 191 lb 4 oz (86.8 kg)  03/06/24 186 lb (84.4 kg)    GEN: Well nourished, well developed in no acute distress NECK: No JVD; No carotid bruits CARDIAC: RRR, no murmurs, rubs, gallops RESPIRATORY:  Clear to auscultation without rales, wheezing or rhonchi  ABDOMEN: Soft, non-tender, non-distended EXTREMITIES:  No edema; No deformity   ASSESSMENT AND PLAN  pSVT Patient reports 1 episode of palpitations after missing 1 dose of metoprolol .  When she took metoprolol  heart rate started coming down.  Will continue Toprol  50 mg daily.  HTN Blood pressure today is high at 160/94.  Patient feels it is generally lower than this.  We will continue metoprolol  50 mg daily.  I recommended she check her blood pressure for the next week and call in with the numbers.  Can increase metoprolol  if it's still high.  HLD LDL 188, triglycerides 178, total cholesterol 266, HDL 41.  She reports statin intolerance.  Continue Zetia  10 mg daily.  I will start Repatha.  Fasting lipid panel in 2 months.    Dispo: Follow-up in 6 months  Signed, Shalee Paolo VEAR Fishman, PA-C

## 2024-05-16 ENCOUNTER — Other Ambulatory Visit: Payer: Self-pay | Admitting: Medical

## 2024-05-16 DIAGNOSIS — H524 Presbyopia: Secondary | ICD-10-CM | POA: Diagnosis not present

## 2024-05-16 DIAGNOSIS — H16223 Keratoconjunctivitis sicca, not specified as Sjogren's, bilateral: Secondary | ICD-10-CM | POA: Diagnosis not present

## 2024-05-16 DIAGNOSIS — E7849 Other hyperlipidemia: Secondary | ICD-10-CM

## 2024-05-16 DIAGNOSIS — H2513 Age-related nuclear cataract, bilateral: Secondary | ICD-10-CM | POA: Diagnosis not present

## 2024-05-16 DIAGNOSIS — H0288B Meibomian gland dysfunction left eye, upper and lower eyelids: Secondary | ICD-10-CM | POA: Diagnosis not present

## 2024-05-16 DIAGNOSIS — H5203 Hypermetropia, bilateral: Secondary | ICD-10-CM | POA: Diagnosis not present

## 2024-05-16 DIAGNOSIS — H04123 Dry eye syndrome of bilateral lacrimal glands: Secondary | ICD-10-CM | POA: Diagnosis not present

## 2024-05-16 DIAGNOSIS — H52222 Regular astigmatism, left eye: Secondary | ICD-10-CM | POA: Diagnosis not present

## 2024-05-16 MED ORDER — REPATHA SURECLICK 140 MG/ML ~~LOC~~ SOAJ: 140.0000 mg | SUBCUTANEOUS | 3 refills | Status: AC

## 2024-05-29 ENCOUNTER — Encounter: Payer: Self-pay | Admitting: Radiology

## 2024-06-02 ENCOUNTER — Encounter: Payer: Self-pay | Admitting: *Deleted

## 2024-06-02 ENCOUNTER — Telehealth: Payer: Self-pay | Admitting: Family Medicine

## 2024-06-02 NOTE — Telephone Encounter (Signed)
 Message sent to follow up with referrals

## 2024-06-02 NOTE — Telephone Encounter (Signed)
 Copied from CRM [id number]. Topic: Referral - Status >> Jun 02, 2024 10:09 AM [place] J wrote: Reason for CRM: The patient was wondering if Dr, Graham medical assistant could see why the referral status is still pending review for Orthopedic Surgery.

## 2024-06-02 NOTE — Telephone Encounter (Signed)
 Will route to Joellen and referral dpt

## 2024-06-02 NOTE — Telephone Encounter (Signed)
 Referral has been faxed to [facility]   Mychart letter and Mychart Message sent to the patient with referral information.   They will review the referral and contact the patient directly to schedule. If the patient does not wish to wait for their call, the patient can reach out as well.

## 2024-06-05 ENCOUNTER — Telehealth: Payer: Self-pay | Admitting: *Deleted

## 2024-06-05 NOTE — Telephone Encounter (Signed)
 Disc and report are ready to pick up at the front desk.   Spoke with patient, she will be in today, 11.10.25, to pick them up.

## 2024-06-05 NOTE — Telephone Encounter (Signed)
 Copied from CRM [id number]. Topic: Clinical - Lab/Test Results >> Jun 05, 2024 12:16 PM Pinkey ORN wrote: Reason for CRM: Knee X-ray >> Jun 05, 2024 12:17 PM Pinkey ORN wrote: Patient states she's needing to pickup the x-ray she had completed on her knee. Patient states she has an appointment tomorrow at [facility], with Orthopedic tomorrow and they need the results. Please follow up with patient.

## 2024-06-06 DIAGNOSIS — M170 Bilateral primary osteoarthritis of knee: Secondary | ICD-10-CM | POA: Diagnosis not present

## 2024-06-11 NOTE — Progress Notes (Unsigned)
 Electrophysiology Clinic Note    Date:  06/12/2024  Patient ID:  Pamla, Pangle 01-08-1951, MRN [id number] PCP:  Randeen Laine DELENA, MD  Cardiologist:  Lonni Hanson, MD  Electrophysiologist:  OLE ONEIDA HOLTS, MD  Electrophysiology APP:  Rehan Holness, NP        Discussed the use of AI scribe software for clinical note transcription with the patient, who gave verbal consent to proceed.   Patient Profile    Chief Complaint: SVT follow-up  History of Present Illness: DIETRICH KE is a 73 y.o. female with PMH notable for SVT, HTN, HLD, breast Ca; seen today for OLE ONEIDA HOLTS, MD (though has not met) for routine electrophysiology followup.   I saw her for initial EP evaluation 01/2023 and initiated daily toprol  for symptomatic palpitation episodes. I last saw her 03/2023 where she had significant improvement in palpitations.  She has seen general cardiology in the interim, last visit 04/2024 where she reported 1 palpitation episode when missed metoprolol , but overall doing very well.   On follow-up today, she is not having any palpitation episodes that she can recall. She has noticed that she is more fatigued lately, does not remember feeling as fatigued when she was on 25mg  toprol .   She does not check her BP at all at home.  She denies chest pain, chest pressure, presyncope or dizziness.    Arrhythmia/Device History No specialty comments available.    ROS:  Please see the history of present illness. All other systems are reviewed and otherwise negative.    Physical Exam    VS:  BP (!) 146/88   Ht 5' 6 (1.676 m)   Wt 189 lb 3.2 oz (85.8 kg)   SpO2 97%   BMI 30.54 kg/m  BMI: Body mass index is 30.54 kg/m.           Wt Readings from Last 3 Encounters:  06/12/24 189 lb 3.2 oz (85.8 kg)  05/15/24 190 lb 6.4 oz (86.4 kg)  05/01/24 191 lb 4 oz (86.8 kg)     GEN- The patient is well appearing, alert and oriented x 3 today.   Lungs- Clear to  ausculation bilaterally, normal work of breathing.  Heart- Regular rate and rhythm, no murmurs, rubs or gallops Extremities- No peripheral edema, warm, dry  Studies Reviewed   Previous EP, cardiology notes.    EKG is not ordered. Personal review of EKG from 05/15/2024 shows:  SR at 85        TTE, 02/18/2023  1. Left ventricular ejection fraction, by estimation, is 60 to 65%. The left ventricle has normal function. The left ventricle has no regional wall motion abnormalities. Left ventricular diastolic parameters are consistent with Grade I diastolic dysfunction (impaired relaxation). The average left ventricular global longitudinal strain is -24.8 %.   2. Right ventricular systolic function is normal. The right ventricular size is normal.   3. The mitral valve is normal in structure. No evidence of mitral valve regurgitation. No evidence of mitral stenosis.   4. The aortic valve is normal in structure. Aortic valve regurgitation is not visualized. No aortic stenosis is present.   5. The inferior vena cava is normal in size with greater than 50% respiratory variability, suggesting right atrial pressure of 3 mmHg.    Long Term monitor, 02/18/2023   The patient was monitored for 7 days, 16 hours.   The predominant rhythm was sinus with an average rate of 81 bpm (range 31-144  bpm in sinus).   There were rare PACs and PVCs.   62 supraventricular runs were observed, lasting up to 1 minute, 46 seconds with a maximum rate of 231 bpm.  The longest episode could represent atrial fibrillation, as the tachyarrhythmia appears irregular.   No prolonged pause was observed.   Patient triggered events corresponded to sinus rhythm, PACs, PVCs, and PSVT.  Predominantly sinus rhythm with multiple episodes of SVT.  Longest episode lasted 1 minute, 46 seconds send: Due to irregularity in the available tracing, paroxysmal atrial fibrillation cannot be excluded.     Assessment and Plan     #) SVT #)  palpitations Palpitations under very good control, unfortunately she is having increased fatigue Will reduce toprol  to 25mg  daily  #) HTN Remains elevated on recheck today and by all recent measurements in OV Will start 2.5mg  amlodipine I've asked her to obtain a BP cuff and check 2-3 times per week and records the measurements. Send BP measurements in about 1 month.         Current medicines are reviewed at length with the patient today.   The patient has concerns regarding her medicines.  The following changes were made today:   REDUCE toprol  to 25mg  daily START 2.5mg  amlodipine  Labs/ tests ordered today include:  No orders of the defined types were placed in this encounter.    Disposition: Follow up with Dr. Kennyth or EP APP in 12 months   Signed, Chantal Needle, NP  06/12/24  12:52 PM  Electrophysiology [facility]

## 2024-06-12 ENCOUNTER — Encounter: Payer: Self-pay | Admitting: Cardiology

## 2024-06-12 ENCOUNTER — Ambulatory Visit: Attending: Cardiology | Admitting: Cardiology

## 2024-06-12 VITALS — BP 146/88 | Ht 66.0 in | Wt 189.2 lb

## 2024-06-12 DIAGNOSIS — R002 Palpitations: Secondary | ICD-10-CM | POA: Insufficient documentation

## 2024-06-12 DIAGNOSIS — I471 Supraventricular tachycardia, unspecified: Secondary | ICD-10-CM | POA: Diagnosis not present

## 2024-06-12 MED ORDER — METOPROLOL SUCCINATE ER 25 MG PO TB24: 25.0000 mg | ORAL_TABLET | Freq: Every evening | ORAL | 3 refills | Status: AC

## 2024-06-12 MED ORDER — AMLODIPINE BESYLATE 2.5 MG PO TABS: 2.5000 mg | ORAL_TABLET | Freq: Every morning | ORAL | 3 refills | Status: AC

## 2024-06-12 NOTE — Patient Instructions (Signed)
 Medication Instructions:   Your physician has recommended you make the following change in your medication:   Decrease- Metoprolol  Succinate 25 mg tablet by mouth once daily pm.  Start- Amlodipine 2.5 mg tablet by mouth once daily am.  *If you need a refill on your cardiac medications before your next appointment, please call your pharmacy*  Lab Work:  Keep follow up in 4 wks for Labs ordered by [employer], [place]  If you have labs (blood work) drawn today and your tests are completely normal, you will receive your results only by: MyChart Message (if you have MyChart) OR A paper copy in the mail If you have any lab test that is abnormal or we need to change your treatment, we will call you to review the results.  Testing/Procedures:  NONE  Follow-Up: At [facility], you and your health needs are our priority.  As part of our continuing mission to provide you with exceptional heart care, our providers are all part of one team.  This team includes your primary Cardiologist (physician) and Advanced Practice Providers or APPs (Physician Assistants and Nurse Practitioners) who all work together to provide you with the care you need, when you need it.  Your next appointment:   12 month(s)  Provider:   Suzann Riddle, NP    We recommend signing up for the patient portal called MyChart.  Sign up information is provided on this After Visit Summary.  MyChart is used to connect with patients for Virtual Visits (Telemedicine).  Patients are able to view lab/test results, encounter notes, upcoming appointments, etc.  Non-urgent messages can be sent to your provider as well.   To learn more about what you can do with MyChart, go to [email or web address].   Other Instructions  Please check your blood pressure and keep a log of your blood pressures and you may drop list off or send your BP log via Mychart when you follow up to have labs drawn.

## 2024-06-17 ENCOUNTER — Other Ambulatory Visit: Payer: Self-pay | Admitting: Family Medicine

## 2024-06-20 NOTE — Telephone Encounter (Signed)
 Pt is overdue for her Yearly Exam (labs prior), please schedule and then route back to me to refill. Thanks

## 2024-06-25 ENCOUNTER — Telehealth: Payer: Self-pay | Admitting: Family Medicine

## 2024-06-25 DIAGNOSIS — R7309 Other abnormal glucose: Secondary | ICD-10-CM

## 2024-06-25 DIAGNOSIS — E7849 Other hyperlipidemia: Secondary | ICD-10-CM

## 2024-06-25 DIAGNOSIS — E559 Vitamin D deficiency, unspecified: Secondary | ICD-10-CM

## 2024-06-25 DIAGNOSIS — R030 Elevated blood-pressure reading, without diagnosis of hypertension: Secondary | ICD-10-CM

## 2024-06-25 DIAGNOSIS — M810 Age-related osteoporosis without current pathological fracture: Secondary | ICD-10-CM

## 2024-06-25 DIAGNOSIS — E538 Deficiency of other specified B group vitamins: Secondary | ICD-10-CM

## 2024-06-25 DIAGNOSIS — Z79899 Other long term (current) drug therapy: Secondary | ICD-10-CM

## 2024-06-25 NOTE — Telephone Encounter (Signed)
-----   Message from Harlene Du sent at 06/20/2024  4:24 PM EST ----- Regarding: Labs Mon 06/26/24 Hello,  Patient is coming in for CPE labs. Can we get orders please.   Thanks

## 2024-06-26 ENCOUNTER — Other Ambulatory Visit (INDEPENDENT_AMBULATORY_CARE_PROVIDER_SITE_OTHER)

## 2024-06-26 ENCOUNTER — Ambulatory Visit: Payer: Self-pay | Admitting: Family Medicine

## 2024-06-26 DIAGNOSIS — E538 Deficiency of other specified B group vitamins: Secondary | ICD-10-CM | POA: Diagnosis not present

## 2024-06-26 DIAGNOSIS — E559 Vitamin D deficiency, unspecified: Secondary | ICD-10-CM

## 2024-06-26 DIAGNOSIS — R7309 Other abnormal glucose: Secondary | ICD-10-CM | POA: Diagnosis not present

## 2024-06-26 DIAGNOSIS — E7849 Other hyperlipidemia: Secondary | ICD-10-CM

## 2024-06-26 DIAGNOSIS — Z79899 Other long term (current) drug therapy: Secondary | ICD-10-CM

## 2024-06-26 DIAGNOSIS — R030 Elevated blood-pressure reading, without diagnosis of hypertension: Secondary | ICD-10-CM | POA: Diagnosis not present

## 2024-06-26 DIAGNOSIS — M810 Age-related osteoporosis without current pathological fracture: Secondary | ICD-10-CM

## 2024-06-26 LAB — CBC WITH DIFFERENTIAL/PLATELET
Basophils Absolute: 0 K/uL (ref 0.0–0.1)
Basophils Relative: 0.5 % (ref 0.0–3.0)
Eosinophils Absolute: 0.1 K/uL (ref 0.0–0.7)
Eosinophils Relative: 1.3 % (ref 0.0–5.0)
HCT: 42.2 % (ref 36.0–46.0)
Hemoglobin: 14 g/dL (ref 12.0–15.0)
Lymphocytes Relative: 19.1 % (ref 12.0–46.0)
Lymphs Abs: 1.7 K/uL (ref 0.7–4.0)
MCHC: 33.1 g/dL (ref 30.0–36.0)
MCV: 88.9 fl (ref 78.0–100.0)
Monocytes Absolute: 0.6 K/uL (ref 0.1–1.0)
Monocytes Relative: 6.4 % (ref 3.0–12.0)
Neutro Abs: 6.5 K/uL (ref 1.4–7.7)
Neutrophils Relative %: 72.7 % (ref 43.0–77.0)
Platelets: 257 K/uL (ref 150.0–400.0)
RBC: 4.74 Mil/uL (ref 3.87–5.11)
RDW: 13.2 % (ref 11.5–15.5)
WBC: 9 K/uL (ref 4.0–10.5)

## 2024-06-26 LAB — COMPREHENSIVE METABOLIC PANEL WITH GFR
ALT: 9 U/L (ref 0–35)
AST: 13 U/L (ref 0–37)
Albumin: 4.1 g/dL (ref 3.5–5.2)
Alkaline Phosphatase: 103 U/L (ref 39–117)
BUN: 13 mg/dL (ref 6–23)
CO2: 28 meq/L (ref 19–32)
Calcium: 9.6 mg/dL (ref 8.4–10.5)
Chloride: 107 meq/L (ref 96–112)
Creatinine, Ser: 0.75 mg/dL (ref 0.40–1.20)
GFR: 78.61 mL/min (ref >60.00)
Glucose, Bld: 101 mg/dL — ABNORMAL HIGH (ref 70–99)
Potassium: 3.8 meq/L (ref 3.5–5.1)
Sodium: 142 meq/L (ref 135–145)
Total Bilirubin: 0.6 mg/dL (ref 0.2–1.2)
Total Protein: 6 g/dL (ref 6.0–8.3)

## 2024-06-26 LAB — LIPID PANEL
Cholesterol: 169 mg/dL (ref 0–200)
HDL: 41.9 mg/dL (ref >39.00)
LDL Cholesterol: 94 mg/dL (ref 0–99)
NonHDL: 127.39
Total CHOL/HDL Ratio: 4
Triglycerides: 168 mg/dL — ABNORMAL HIGH (ref 0.0–149.0)
VLDL: 33.6 mg/dL (ref 0.0–40.0)

## 2024-06-26 LAB — VITAMIN D 25 HYDROXY (VIT D DEFICIENCY, FRACTURES): VITD: 44.3 ng/mL (ref 30.00–100.00)

## 2024-06-26 LAB — VITAMIN B12: Vitamin B-12: 530 pg/mL (ref 211–911)

## 2024-06-26 LAB — TSH: TSH: 1.48 u[IU]/mL (ref 0.35–5.50)

## 2024-06-26 LAB — HEMOGLOBIN A1C: Hgb A1c MFr Bld: 5.7 % (ref 4.6–6.5)

## 2024-06-29 DIAGNOSIS — M25562 Pain in left knee: Secondary | ICD-10-CM | POA: Diagnosis not present

## 2024-06-29 DIAGNOSIS — M25561 Pain in right knee: Secondary | ICD-10-CM | POA: Diagnosis not present

## 2024-06-29 DIAGNOSIS — G8929 Other chronic pain: Secondary | ICD-10-CM | POA: Diagnosis not present

## 2024-06-30 ENCOUNTER — Ambulatory Visit: Admitting: Family Medicine

## 2024-06-30 ENCOUNTER — Encounter: Payer: Self-pay | Admitting: Family Medicine

## 2024-06-30 VITALS — BP 140/81 | HR 85 | Temp 98.1°F | Ht 65.0 in | Wt 188.5 lb

## 2024-06-30 DIAGNOSIS — Z1211 Encounter for screening for malignant neoplasm of colon: Secondary | ICD-10-CM

## 2024-06-30 DIAGNOSIS — K219 Gastro-esophageal reflux disease without esophagitis: Secondary | ICD-10-CM | POA: Diagnosis not present

## 2024-06-30 DIAGNOSIS — E538 Deficiency of other specified B group vitamins: Secondary | ICD-10-CM | POA: Diagnosis not present

## 2024-06-30 DIAGNOSIS — M810 Age-related osteoporosis without current pathological fracture: Secondary | ICD-10-CM | POA: Diagnosis not present

## 2024-06-30 DIAGNOSIS — E7849 Other hyperlipidemia: Secondary | ICD-10-CM | POA: Diagnosis not present

## 2024-06-30 DIAGNOSIS — Z808 Family history of malignant neoplasm of other organs or systems: Secondary | ICD-10-CM

## 2024-06-30 DIAGNOSIS — I10 Essential (primary) hypertension: Secondary | ICD-10-CM | POA: Diagnosis not present

## 2024-06-30 DIAGNOSIS — M1711 Unilateral primary osteoarthritis, right knee: Secondary | ICD-10-CM

## 2024-06-30 DIAGNOSIS — Z853 Personal history of malignant neoplasm of breast: Secondary | ICD-10-CM

## 2024-06-30 DIAGNOSIS — E559 Vitamin D deficiency, unspecified: Secondary | ICD-10-CM | POA: Diagnosis not present

## 2024-06-30 DIAGNOSIS — I471 Supraventricular tachycardia, unspecified: Secondary | ICD-10-CM

## 2024-06-30 DIAGNOSIS — Z79899 Other long term (current) drug therapy: Secondary | ICD-10-CM | POA: Diagnosis not present

## 2024-06-30 DIAGNOSIS — R7303 Prediabetes: Secondary | ICD-10-CM | POA: Diagnosis not present

## 2024-06-30 NOTE — Assessment & Plan Note (Signed)
 Doing better overall Metoprolol  xl 25 mg daily  Cardiology care

## 2024-06-30 NOTE — Assessment & Plan Note (Signed)
 Conitnues to decline dexa  2 falls (long discussion of fall prevention)  No fractures  D level is better with 4000 international units D3 daily Discussed fall prevention, supplements and exercise for bone density

## 2024-06-30 NOTE — Assessment & Plan Note (Signed)
 Lab Results  Component Value Date   VITAMINB12 530 06/26/2024   Continues 1000 mcg daily oral

## 2024-06-30 NOTE — Assessment & Plan Note (Signed)
 Colonoscopy 03/2018 - could not complete due to severe tics  Did scan -reassuring  No recall

## 2024-06-30 NOTE — Assessment & Plan Note (Signed)
 Omeprazole  20 mg daily - Supplementing B12 and vit D

## 2024-06-30 NOTE — Progress Notes (Unsigned)
 Subjective:    Patient ID: Ashley Rasmussen, female    DOB: March 29, 1951, 73 y.o.   MRN: [id number]  HPI  Pt presents for annual follow up of chronic medical problems   Wt Readings from Last 3 Encounters:  06/30/24 188 lb 8 oz (85.5 kg)  06/12/24 189 lb 3.2 oz (85.8 kg)  05/15/24 190 lb 6.4 oz (86.4 kg)   31.37 kg/m  Vitals:   06/30/24 1424 06/30/24 1454  BP: (!) 144/90 (!) 140/81  Pulse: 85   Temp: 98.1 F (36.7 C)   SpO2: 97%     Immunization History  Administered Date(s) Administered   Fluad Quad(high Dose 65+) 05/31/2020, 04/30/2022   Fluad Trivalent(High Dose 65+) 06/03/2023   INFLUENZA, HIGH DOSE SEASONAL PF 05/01/2024   Influenza Split 06/21/2012   Influenza Whole 07/13/2002   Influenza,inj,Quad PF,6+ Mos 05/23/2013, 09/26/2015, 05/22/2016, 06/03/2017, 05/12/2018, 03/21/2019   PFIZER(Purple Top)SARS-COV-2 Vaccination 11/06/2019, 12/05/2019, 06/12/2020   Pneumococcal Conjugate-13 09/26/2015   Pneumococcal Polysaccharide-23 11/24/2016   Td 05/27/2005   Tdap 11/24/2016    There are no preventive care reminders to display for this patient.  Feeling good overall   Shingrix -declines   Mammogram 10/2023 Self breast exam-no lumps   Gyn health No issues  Has lichen sclerosis- barely has to use her clobetasol     Colon cancer screening  Colonoscopy 03/2018   Bone health  Dexa 09 Declines another one  Osteoporosis  Falls- two falls (last-shoe got caught on garbage bag)-no fracutres  Fractures-none  Supplements  Last vitamin D  Lab Results  Component Value Date   VD25OH 44.30 06/26/2024    Exercise  Started back to the gym yesterday /therapy  Also exercises at  home  Already helping her knees    Mood    06/30/2024    2:31 PM 05/01/2024   12:11 PM 03/06/2024    9:46 AM 10/27/2023   12:34 PM 06/03/2023    9:58 AM  Depression screen PHQ 2/9  Decreased Interest 0 0 0 0 0  Down, Depressed, Hopeless 0 0 0 0 0  PHQ - 2 Score 0 0 0 0 0  Altered sleeping  0 0 0 0 0  Tired, decreased energy 0 0 0 0 0  Change in appetite 0 0 0 0 0  Feeling bad or failure about yourself  0 0 0 0 0  Trouble concentrating 0 0 0 0 0  Moving slowly or fidgety/restless 0 0 0 0 0  Suicidal thoughts 0 0 0 0 0  PHQ-9 Score 0 0  0  0  0   Difficult doing work/chores Not difficult at all Not difficult at all Not difficult at all Not difficult at all Not difficult at all     Data saved with a previous flowsheet row definition     Blood pressure:  No cp or palpitations or headaches or edema  No side effects to medicines  BP Readings from Last 3 Encounters:  06/30/24 (!) 140/81  06/12/24 (!) 146/88  05/15/24 (!) 160/94    Amlodipine  2.5 mg daily  Metoprolol  xl 25 mg daily  Not dizzy with this  Sees cardiology for SVT   Trying to take less ibuprofen    Lab Results  Component Value Date   NA 142 06/26/2024   K 3.8 06/26/2024   CO2 28 06/26/2024   GLUCOSE 101 (H) 06/26/2024   BUN 13 06/26/2024   CREATININE 0.75 06/26/2024   CALCIUM  9.6 06/26/2024   GFR 78.61 06/26/2024  GFRNONAA >60 10/16/2020    GERD Omeprazole  20 mg daily   B12 def  Oral 1000 mcg daily  Lab Results  Component Value Date   VITAMINB12 530 06/26/2024     Glucose  Lab Results  Component Value Date   HGBA1C 5.7 06/26/2024   HGBA1C 5.9 05/27/2023   HGBA1C 5.9 04/24/2022  Is cutting back on sugar intake  Occational soda (mini can)      Hyperlipidemia Lab Results  Component Value Date   CHOL 169 06/26/2024   CHOL 266 (H) 08/10/2023   CHOL 237 (H) 05/27/2023   Lab Results  Component Value Date   HDL 41.90 06/26/2024   HDL 41.70 08/10/2023   HDL 44.30 05/27/2023   Lab Results  Component Value Date   LDLCALC 94 06/26/2024   LDLCALC 188 (H) 08/10/2023   LDLCALC 163 (H) 05/27/2023   Lab Results  Component Value Date   TRIG 168.0 (H) 06/26/2024   TRIG 178.0 (H) 08/10/2023   TRIG 145.0 05/27/2023   Lab Results  Component Value Date   CHOLHDL 4 06/26/2024    CHOLHDL 6 08/10/2023   CHOLHDL 5 05/27/2023   Lab Results  Component Value Date   LDLDIRECT 153.0 11/24/2016   LDLDIRECT 177.0 09/27/2015   LDLDIRECT 140.9 08/09/2012   Repatha  Zetia  10 mg daily  Improved   Lab Results  Component Value Date   ALT 9 06/26/2024   AST 13 06/26/2024   ALKPHOS 103 06/26/2024   BILITOT 0.6 06/26/2024     Patient Active Problem List   Diagnosis Date Noted   Hypertension 06/30/2024   Left knee pain 05/01/2024   Arthritis of right knee 10/27/2023   Vitamin B12 deficiency 06/03/2023   Vitamin D  deficiency 06/03/2023   Current use of proton pump inhibitor 05/23/2023   SVT (supraventricular tachycardia) 02/23/2023   Near syncope 01/27/2023   Dysuria 09/18/2021   Genetic testing 10/24/2020   Family history of breast cancer    Family history of lung cancer    Ductal carcinoma in situ (DCIS) of right breast 10/10/2020   Lichen sclerosus 03/05/2020   Medicare annual wellness visit, subsequent 06/27/2019   Estrogen deficiency 01/26/2018   Prediabetes 12/08/2017   Family history of melanoma 11/24/2016   Palpitations    Tachycardia    Hearing loss in left ear 10/02/2015   GERD (gastroesophageal reflux disease) 05/08/2015   Obesity 12/13/2012   Encounter for routine gynecological examination 08/16/2012   Colon cancer screening 08/16/2012   Routine general medical examination at a health care facility 08/08/2012   Uterine prolapse 07/06/2012   BACK PAIN, LUMBAR, WITH RADICULOPATHY 08/15/2010   Hyperlipidemia 04/13/2008   Osteoporosis 04/06/2008   Allergic rhinitis 04/15/2007   BREAST CANCER, HX OF 04/15/2007   Past Medical History:  Diagnosis Date   Allergic rhinitis, cause unspecified    Breast cancer (HCC) 1992   Complication of anesthesia    Family history of breast cancer    HLD (hyperlipidemia)    Osteopenia    Osteoporosis, unspecified    Personal history of chemotherapy    Personal history of malignant neoplasm of breast     needs yearly mammogram and MRI every 2   Personal history of radiation therapy    PONV (postoperative nausea and vomiting)    Thoracic or lumbosacral neuritis or radiculitis, unspecified    Past Surgical History:  Procedure Laterality Date   BREAST BIOPSY Left 03/2015   BREAST BIOPSY Right 10/09/2020   Breast cancer excision  BREAST LUMPECTOMY Right    COLONOSCOPY WITH PROPOFOL  N/A 04/11/2018   Procedure: COLONOSCOPY WITH PROPOFOL ;  Surgeon: Therisa Bi, MD;  Location: [facility] ENDOSCOPY;  Service: Gastroenterology;  Laterality: N/A;   LATISSIMUS FLAP TO BREAST     due to radiation burn   MASTECTOMY Right 10/30/2020   TOTAL MASTECTOMY Right 10/30/2020   Procedure: RIGHT MASTECTOMY;  Surgeon: Curvin Deward MOULD, MD;  Location: [facility];  Service: General;  Laterality: Right;   Social History   Tobacco Use   Smoking status: Former    Current packs/day: 0.00    Average packs/day: 0.1 packs/day for 2.0 years (0.2 ttl pk-yrs)    Types: Cigarettes    Start date: 07/28/1987    Quit date: 07/27/1989    Years since quitting: 34.9   Smokeless tobacco: Never   Tobacco comments:    quit over 74yrs ago  Vaping Use   Vaping status: Never Used  Substance Use Topics   Alcohol use: No    Alcohol/week: 0.0 standard drinks of alcohol   Drug use: No   Family History  Problem Relation Age of Onset   Other Mother        B-12 deficiency   Breast cancer Mother 57   Melanoma Mother        dx 62s (foot) and 73s (arm)   Myelodysplastic syndrome Mother        dx mid-70s   Osteoporosis Mother    Alcoholism Father    Other Sister        B-12 deficiency   Breast cancer Sister 74       inconclusive genetic testing   Osteoporosis Sister    Lung cancer Maternal Uncle        radiation exposure    Cancer Paternal Aunt        unknown type, dx >50   Cancer Paternal Aunt        unknown type, dx >50   Breast cancer Cousin 87       (maternal first cousin)   Breast cancer Cousin         dx 108s or 55s (maternal first cousin)   Cancer Nephew 23       Ewing's sarcoma   Heart disease Neg Hx    Allergies  Allergen Reactions   Crestor  [Rosuvastatin ]     Myalgia  Even once weekly dose   Alendronate Sodium Other (See Comments)    REACTION: GI side effects   Nsaids Other (See Comments)    GI upset with oral nsaids   Pravachol Other (See Comments)    myalgias   Raloxifene Other (See Comments)    REACTION: breast tenderness and leg pain   Simvastatin Other (See Comments)    REACTION: myalgias   Current Outpatient Medications on File Prior to Visit  Medication Sig Dispense Refill   acetaminophen  (TYLENOL ) 500 MG tablet Take 500 mg by mouth every 6 (six) hours as needed.     amLODipine  (NORVASC ) 2.5 MG tablet Take 1 tablet (2.5 mg total) by mouth in the morning. 90 tablet 3   cholecalciferol (VITAMIN D3) 25 MCG (1000 UNIT) tablet Take 4,000 Units by mouth daily.     clobetasol  ointment (TEMOVATE ) 0.05 % Apply to affected area every night for 4 weeks, then every other day for 4 weeks and then twice a week for 4 weeks or until resolution. 30 g 3   cyanocobalamin  (VITAMIN B12) 1000 MCG tablet Take 1,000 mcg by mouth daily.  Evolocumab  (REPATHA  SURECLICK) 140 MG/ML SOAJ Inject 140 mg into the skin every 14 (fourteen) days. 6 mL 3   ezetimibe  (ZETIA ) 10 MG tablet Take 1 tablet (10 mg total) by mouth daily. 90 tablet 3   ibuprofen (ADVIL) 400 MG tablet Take 400 mg by mouth daily as needed.     metoprolol  succinate (TOPROL -XL) 25 MG 24 hr tablet Take 1 tablet (25 mg total) by mouth every evening. 90 tablet 3   Multiple Vitamin (MULTIVITAMIN) tablet Take 3 tablets by mouth daily.     omeprazole  (PRILOSEC) 20 MG capsule TAKE 1 CAPSULE DAILY AS NEEDED 90 capsule 0   No current facility-administered medications on file prior to visit.    Review of Systems  Constitutional:  Negative for activity change, appetite change, fatigue, fever and unexpected weight change.  HENT:   Negative for congestion, ear pain, rhinorrhea, sinus pressure and sore throat.   Eyes:  Negative for pain, redness and visual disturbance.  Respiratory:  Negative for cough, shortness of breath and wheezing.   Cardiovascular:  Negative for chest pain and palpitations.  Gastrointestinal:  Negative for abdominal pain, blood in stool, constipation and diarrhea.  Endocrine: Negative for polydipsia and polyuria.  Genitourinary:  Negative for dysuria, frequency and urgency.  Musculoskeletal:  Negative for arthralgias, back pain and myalgias.  Skin:  Negative for pallor and rash.  Allergic/Immunologic: Negative for environmental allergies.  Neurological:  Negative for dizziness, syncope and headaches.  Hematological:  Negative for adenopathy. Does not bruise/bleed easily.  Psychiatric/Behavioral:  Negative for decreased concentration and dysphoric mood. The patient is not nervous/anxious.        Objective:   Physical Exam Constitutional:      General: She is not in acute distress.    Appearance: Normal appearance. She is well-developed. She is obese. She is not ill-appearing or diaphoretic.  HENT:     Head: Normocephalic and atraumatic.     Right Ear: Tympanic membrane, ear canal and external ear normal.     Left Ear: Tympanic membrane, ear canal and external ear normal.     Nose: Nose normal. No congestion.     Mouth/Throat:     Mouth: Mucous membranes are moist.     Pharynx: Oropharynx is clear. No posterior oropharyngeal erythema.  Eyes:     General: No scleral icterus.    Extraocular Movements: Extraocular movements intact.     Conjunctiva/sclera: Conjunctivae normal.     Pupils: Pupils are equal, round, and reactive to light.  Neck:     Thyroid : No thyromegaly.     Vascular: No carotid bruit or JVD.  Cardiovascular:     Rate and Rhythm: Normal rate and regular rhythm.     Pulses: Normal pulses.     Heart sounds: Normal heart sounds.     No gallop.  Pulmonary:     Effort:  Pulmonary effort is normal. No respiratory distress.     Breath sounds: Normal breath sounds. No wheezing.     Comments: Good air exch Chest:     Chest wall: No tenderness.  Abdominal:     General: Bowel sounds are normal. There is no distension or abdominal bruit.     Palpations: Abdomen is soft. There is no mass.     Tenderness: There is no abdominal tenderness.     Hernia: No hernia is present.  Genitourinary:    Comments: Baseline surgical changes/mastectomy on right  Left breast Breast exam: No mass, nodules, thickening, tenderness, bulging, retraction, inflamation, nipple  discharge or skin changes noted.  No axillary or clavicular LA.     Musculoskeletal:        General: No tenderness. Normal range of motion.     Cervical back: Normal range of motion and neck supple. No rigidity. No muscular tenderness.     Right lower leg: No edema.     Left lower leg: No edema.     Comments: No kyphosis   Lymphadenopathy:     Cervical: No cervical adenopathy.  Skin:    General: Skin is warm and dry.     Coloration: Skin is not pale.     Findings: No erythema or rash.     Comments: Solar lentigines diffusely  Some scattered sks  Neurological:     Mental Status: She is alert. Mental status is at baseline.     Cranial Nerves: No cranial nerve deficit.     Motor: No abnormal muscle tone.     Coordination: Coordination normal.     Gait: Gait normal.     Deep Tendon Reflexes: Reflexes are normal and symmetric. Reflexes normal.  Psychiatric:        Mood and Affect: Mood normal.        Cognition and Memory: Cognition and memory normal.           Assessment & Plan:   Problem List Items Addressed This Visit       Cardiovascular and Mediastinum   SVT (supraventricular tachycardia)   Doing better overall Metoprolol  xl 25 mg daily  Cardiology care       Hypertension   Now being treated by cardiology for this and SVT Using caution in light of near syncope in past  bp is  starting to improve BP Readings from Last 1 Encounters:  06/30/24 (!) 140/81   No changes needed Most recent labs reviewed  Disc lifstyle change with low sodium diet and exercise   Amlodipine  2.5 mg daily (may be increase to 5 soon)  Metoprolol  xl 25 mg daily (does not tolerate higher dose)  Checking blood pressure at home on a schedule Will check in with cardiology soon         Digestive   GERD (gastroesophageal reflux disease)   Omeprazole  20 mg daily - Supplementing B12 and vit D          Musculoskeletal and Integument   Osteoporosis   Conitnues to decline dexa  2 falls (long discussion of fall prevention)  No fractures  D level is better with 4000 international units D3 daily Discussed fall prevention, supplements and exercise for bone density         Arthritis of right knee   Seeing orthopedics In PT Exercising  Doing better overall         Other   Vitamin D  deficiency - Primary   Improved Last vitamin D  Lab Results  Component Value Date   VD25OH 44.30 06/26/2024    4000 international units daily D3      Vitamin B12 deficiency   Lab Results  Component Value Date   VITAMINB12 530 06/26/2024   Continues 1000 mcg daily oral      Prediabetes   Prediabetes  Lab Results  Component Value Date   HGBA1C 5.7 06/26/2024   HGBA1C 5.9 05/27/2023   HGBA1C 5.9 04/24/2022    disc imp of low glycemic diet and wt loss to prevent DM2   Will try and cut sugar drinks       Hyperlipidemia   Disc  goals for lipids and reasons to control them Rev last labs with pt Rev low sat fat diet in detail LDL is down to 94 with repatha  and zetia  10 mg daily  Under cardiology care       Family history of melanoma   Continues yearly derm visits  Has many sks  Uses sun protection       Current use of proton pump inhibitor   Supplementing B12 and D  Lab Results  Component Value Date   VITAMINB12 530 06/26/2024   Last vitamin D  Lab Results  Component  Value Date   VD25OH 44.30 06/26/2024         Colon cancer screening   Colonoscopy 03/2018 - could not complete due to severe tics  Did scan -reassuring  No recall      BREAST CANCER, HX OF   Right mastectomy  Mammo due April  Considering left mastectomy for prophylaxis in light of risk factors

## 2024-06-30 NOTE — Assessment & Plan Note (Signed)
 Now being treated by cardiology for this and SVT Using caution in light of near syncope in past  bp is starting to improve BP Readings from Last 1 Encounters:  06/30/24 (!) 140/81   No changes needed Most recent labs reviewed  Disc lifstyle change with low sodium diet and exercise   Amlodipine  2.5 mg daily (may be increase to 5 soon)  Metoprolol  xl 25 mg daily (does not tolerate higher dose)  Checking blood pressure at home on a schedule Will check in with cardiology soon

## 2024-06-30 NOTE — Assessment & Plan Note (Signed)
 Continues yearly derm visits  Has many sks  Uses sun protection

## 2024-06-30 NOTE — Assessment & Plan Note (Signed)
 Prediabetes  Lab Results  Component Value Date   HGBA1C 5.7 06/26/2024   HGBA1C 5.9 05/27/2023   HGBA1C 5.9 04/24/2022    disc imp of low glycemic diet and wt loss to prevent DM2   Will try and cut sugar drinks

## 2024-06-30 NOTE — Assessment & Plan Note (Signed)
 Seeing orthopedics In PT Exercising  Doing better overall

## 2024-06-30 NOTE — Assessment & Plan Note (Signed)
 Supplementing B12 and D  Lab Results  Component Value Date   VITAMINB12 530 06/26/2024   Last vitamin D  Lab Results  Component Value Date   VD25OH 44.30 06/26/2024

## 2024-06-30 NOTE — Assessment & Plan Note (Signed)
 Right mastectomy  Mammo due April  Considering left mastectomy for prophylaxis in light of risk factors

## 2024-06-30 NOTE — Assessment & Plan Note (Signed)
 Improved Last vitamin D  Lab Results  Component Value Date   VD25OH 44.30 06/26/2024    4000 international units daily D3

## 2024-06-30 NOTE — Patient Instructions (Addendum)
 For prediabetes Avoid added sugars in your diet when you can  Try to get most of your carbohydrates from produce (with the exception of white potatoes) and whole grains Eat less bread/pasta/rice/snack foods/cereals/sweets and other items from the middle of the grocery store (processed carbs)  Try and cut drinks down to 1 a month if you can   Check in with cardiology  Blood pressure is improved but not at goal ? May want to increase amlodipine  dose   Keep checking blood pressure at home   Take care of yourself   Continue exercise!!

## 2024-07-02 NOTE — Assessment & Plan Note (Signed)
 Disc goals for lipids and reasons to control them Rev last labs with pt Rev low sat fat diet in detail LDL is down to 94 with repatha  and zetia  10 mg daily  Under cardiology care

## 2024-07-06 DIAGNOSIS — M25562 Pain in left knee: Secondary | ICD-10-CM | POA: Diagnosis not present

## 2024-07-06 DIAGNOSIS — M25561 Pain in right knee: Secondary | ICD-10-CM | POA: Diagnosis not present

## 2024-07-06 DIAGNOSIS — G8929 Other chronic pain: Secondary | ICD-10-CM | POA: Diagnosis not present

## 2024-07-11 DIAGNOSIS — M25562 Pain in left knee: Secondary | ICD-10-CM | POA: Diagnosis not present

## 2024-07-11 DIAGNOSIS — M25561 Pain in right knee: Secondary | ICD-10-CM | POA: Diagnosis not present

## 2024-07-11 DIAGNOSIS — G8929 Other chronic pain: Secondary | ICD-10-CM | POA: Diagnosis not present

## 2024-08-24 ENCOUNTER — Telehealth: Payer: Self-pay | Admitting: Internal Medicine

## 2024-08-24 NOTE — Telephone Encounter (Signed)
"  ° °  Pre-operative Risk Assessment    Patient Name: Ashley Rasmussen  DOB: July 10, 1951 MRN: [id number]   Date of last office visit: 05/15/24 Date of next office visit: TBD  Request for Surgical Clearance    Procedure:  Dental Extraction - Amount of Teeth to be Pulled:  N/A  Date of Surgery:  Clearance TBD                                Surgeon:  N/A Surgeon's Group or Practice Name:  [facility] Phone number:  [phone] Fax number:  [phone]   Type of Clearance Requested:   - Medical    Type of Anesthesia:  Local    Additional requests/questions:    Bonney Audrene LOISE Agapito   08/24/2024, 2:26 PM   "

## 2024-08-25 NOTE — Telephone Encounter (Signed)
 Preoperative team,  Please contact dental office and ask about details surrounding procedure.  We will need to know how many teeth are being pulled.  Once we know details surrounding surgery we have to be able to provide recommendations and advise on cardiac risk.  Thank you for your help.  Ashley Rasmussen. Refael Fulop NP-C     08/25/2024, 7:29 AM [facility] [address] [place], [place] [address] Office [phone]

## 2024-08-25 NOTE — Telephone Encounter (Signed)
 I called the DDS office to obtain the needed information for the preop clearance; though no answer.   I will fax over to the DDS to fax a new request with the needed information. PROCEDURE TO BE DONE AT THIS TIME:  NAME OF PROVIDER PERFORMING PROCEDURE DO YOU NEED ANY BLOOD THINNERS TO BE HELD  IF EXTRACTIONS; HOW MANY TEETH BEING EXTRACTED, AS WELL AS IF THEY ARE SIMPLE OR SURGICAL EXTRACTIONS.   PLEASE FAX NEW REQUEST TO [phone] PREOP TEAM  THANK YOU     [facility] ON [facility] AND FAX#   PH: [phone] FAX# [phone]

## 2024-08-29 NOTE — Telephone Encounter (Signed)
 I tried to call the DDS office again to obtain needed information for preop clearance; though no answer. See previous notes.  I will fax these notes to DDS office to send new request with all needed information noted.   [facility] ON [facility] AND FAX#   PH# [phone] FAX# [phone]

## 2024-08-30 NOTE — Telephone Encounter (Signed)
 Caller Meg) stated she is returning Pre-op to provider further information.

## 2024-08-30 NOTE — Telephone Encounter (Signed)
 [name] with [facility] called back and provided needed information for preop clearance.      Pre-operative Risk Assessment    Patient Name: Ashley Rasmussen  DOB: 26-Mar-1951 MRN: [id number]   Date of last office visit: 06/12/24 CHANTAL NEEDLE, NP Date of next office visit: NONE  Request for Surgical Clearance    Procedure:  Dental Extraction - Amount of Teeth to be Pulled:  3 TEETH FOR SURGICAL EXTRACTIONS  Date of Surgery:  Clearance TBD                                Surgeon:  DR. DYANNA, DDS Surgeon's Group or Practice Name:  [facility]  Phone number:  [phone] Fax number:  [phone]   Type of Clearance Requested:   - Medical    Type of Anesthesia:  Local    Additional requests/questions:    Bonney Niels Jest   08/30/2024, 9:33 AM

## 2024-08-31 NOTE — Telephone Encounter (Signed)
 1st attempt to reach pt regarding surgical clearance and the need for an TELE appointment.  Someone answered and then the phone hung up.

## 2024-08-31 NOTE — Telephone Encounter (Signed)
 Spoke with patient, she stated that she no longer needs the clearance, because she already had her tooth pulled by another dentist, said  she sis not like that dentist, so she went somewhere else and they did it  Please advise on next step.

## 2024-08-31 NOTE — Telephone Encounter (Signed)
" ° °  Name: [name]  DOB: 10-26-1950  MRN: [id number]  Primary Cardiologist: Lonni Hanson, MD  Chart reviewed as part of pre-operative protocol coverage. Because of Nirvi Boehler Hungate's past medical history and time since last visit, she will require a follow-up telephone visit in order to better assess preoperative cardiovascular risk.  Pre-op covering staff: - Please schedule appointment and call patient to inform them. If patient already had an upcoming appointment within acceptable timeframe, please add pre-op clearance to the appointment notes so provider is aware. - Please contact requesting surgeon's office via preferred method (i.e, phone, fax) to inform them of need for appointment prior to surgery.  She is not taking any anticoagulation or antiplatelet agents.  Orren LOISE Fabry, PA-C  08/31/2024, 7:50 AM   "

## 2025-03-07 ENCOUNTER — Ambulatory Visit
